# Patient Record
Sex: Male | Born: 1960 | Race: White | Marital: Single
Health system: Southern US, Community
[De-identification: ages and names within clinical notes are randomized; demographics above are authoritative.]

## PROBLEM LIST (undated history)

## (undated) DIAGNOSIS — R002 Palpitations: Secondary | ICD-10-CM

## (undated) DIAGNOSIS — I2510 Atherosclerotic heart disease of native coronary artery without angina pectoris: Secondary | ICD-10-CM

## (undated) DIAGNOSIS — E119 Type 2 diabetes mellitus without complications: Secondary | ICD-10-CM

## (undated) DIAGNOSIS — I10 Essential (primary) hypertension: Secondary | ICD-10-CM

## (undated) DIAGNOSIS — K219 Gastro-esophageal reflux disease without esophagitis: Secondary | ICD-10-CM

## (undated) DIAGNOSIS — E785 Hyperlipidemia, unspecified: Secondary | ICD-10-CM

## (undated) HISTORY — DX: Hyperlipidemia, unspecified: E78.5

## (undated) HISTORY — DX: Essential (primary) hypertension: I10

## (undated) HISTORY — DX: Palpitations: R00.2

## (undated) HISTORY — DX: Type 2 diabetes mellitus without complications: E11.9

## (undated) HISTORY — PX: SALIVARY GLAND SURGERY: SHX768

---

## 1999-05-26 DIAGNOSIS — C4492 Squamous cell carcinoma of skin, unspecified: Secondary | ICD-10-CM

## 1999-05-26 HISTORY — DX: Squamous cell carcinoma of skin, unspecified: C44.92

## 2005-01-20 ENCOUNTER — Ambulatory Visit: Payer: Self-pay

## 2014-04-20 ENCOUNTER — Other Ambulatory Visit: Payer: Self-pay | Admitting: Otolaryngology

## 2014-04-20 ENCOUNTER — Other Ambulatory Visit (HOSPITAL_COMMUNITY): Payer: Self-pay | Admitting: Otolaryngology

## 2014-04-20 DIAGNOSIS — R221 Localized swelling, mass and lump, neck: Secondary | ICD-10-CM

## 2014-04-22 ENCOUNTER — Other Ambulatory Visit (HOSPITAL_COMMUNITY): Payer: Self-pay | Admitting: Otolaryngology

## 2014-04-22 DIAGNOSIS — R0989 Other specified symptoms and signs involving the circulatory and respiratory systems: Secondary | ICD-10-CM

## 2014-04-24 ENCOUNTER — Ambulatory Visit (HOSPITAL_COMMUNITY): Payer: Self-pay

## 2014-04-27 ENCOUNTER — Other Ambulatory Visit: Payer: Self-pay | Admitting: Radiology

## 2014-04-30 ENCOUNTER — Other Ambulatory Visit: Payer: Self-pay | Admitting: Radiology

## 2014-05-01 ENCOUNTER — Ambulatory Visit (HOSPITAL_COMMUNITY)
Admission: RE | Admit: 2014-05-01 | Discharge: 2014-05-01 | Disposition: A | Discharge status: Home / Self Care | Payer: BLUE CROSS/BLUE SHIELD | Source: Ambulatory Visit | Attending: Otolaryngology | Admitting: Otolaryngology

## 2014-05-01 ENCOUNTER — Encounter (HOSPITAL_COMMUNITY): Payer: Self-pay

## 2014-05-01 DIAGNOSIS — R590 Localized enlarged lymph nodes: Secondary | ICD-10-CM | POA: Insufficient documentation

## 2014-05-01 DIAGNOSIS — R0989 Other specified symptoms and signs involving the circulatory and respiratory systems: Secondary | ICD-10-CM

## 2014-05-01 LAB — CBC
HCT: 36.1 % — ABNORMAL LOW (ref 39.0–52.0)
HEMOGLOBIN: 12.3 g/dL — AB (ref 13.0–17.0)
MCH: 30.4 pg (ref 26.0–34.0)
MCHC: 34.1 g/dL (ref 30.0–36.0)
MCV: 89.1 fL (ref 78.0–100.0)
PLATELETS: 183 10*3/uL (ref 150–400)
RBC: 4.05 MIL/uL — ABNORMAL LOW (ref 4.22–5.81)
RDW: 12.4 % (ref 11.5–15.5)
WBC: 4 10*3/uL (ref 4.0–10.5)

## 2014-05-01 LAB — GLUCOSE, CAPILLARY: Glucose-Capillary: 271 mg/dL — ABNORMAL HIGH (ref 70–99)

## 2014-05-01 LAB — APTT: aPTT: 28 seconds (ref 24–37)

## 2014-05-01 LAB — PROTIME-INR
INR: 0.99 (ref 0.00–1.49)
Prothrombin Time: 13.1 seconds (ref 11.6–15.2)

## 2014-05-01 MED ORDER — FENTANYL CITRATE 0.05 MG/ML IJ SOLN
INTRAMUSCULAR | Status: AC
  Filled 2014-05-01: qty 2

## 2014-05-01 MED ORDER — SODIUM CHLORIDE 0.9 % IV SOLN: Freq: Once | INTRAVENOUS | Status: DC

## 2014-05-01 MED ORDER — FENTANYL CITRATE 0.05 MG/ML IJ SOLN
INTRAMUSCULAR | Status: AC | PRN
  Administered 2014-05-01: 50 ug via INTRAVENOUS

## 2014-05-01 MED ORDER — LIDOCAINE HCL (PF) 1 % IJ SOLN
INTRAMUSCULAR | Status: AC
  Filled 2014-05-01: qty 10

## 2014-05-01 MED ORDER — MIDAZOLAM HCL 2 MG/2ML IJ SOLN
INTRAMUSCULAR | Status: AC
  Filled 2014-05-01: qty 2

## 2014-05-01 MED ORDER — MIDAZOLAM HCL 2 MG/2ML IJ SOLN
INTRAMUSCULAR | Status: AC | PRN
  Administered 2014-05-01: 1 mg via INTRAVENOUS

## 2014-05-01 NOTE — Sedation Documentation (Signed)
Advised Dr. Darreld Mclean of DM and prior surgery.

## 2014-05-01 NOTE — H&P (Signed)
Chief Complaint: L neck mass  Referring Physician(s): Kelly Hardy,Ewain P.  History of Present Illness: Kelly Hardy is a 54 y.o. 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  Past Surgical History  Procedure Laterality Date  . Salivary gland surgery Left     benign    Allergies: Review of patient's allergies indicates not on file.  Medications: Prior to Admission medications   Not on File    No family history on file.  History   Social History  . Marital Status: Married    Spouse Name: N/A    Number of Children: N/A  . Years of Education: N/A   Social History Main Topics  . Smoking status: Never Smoker   . Smokeless tobacco: Not on file  . Alcohol Use: Not on file  . Drug Use: Not on file  . Sexual Activity: Not on file   Other Topics Concern  . Not on file   Social History Narrative  . No narrative on file      Review of Systems: A 12 point ROS discussed and pertinent positives are indicated in the HPI above.  All other systems are negative.  Review of Systems  Constitutional: Negative for fever, activity change and appetite change.  HENT: Negative for sore throat and trouble swallowing.   Respiratory: Negative for shortness of breath.   Gastrointestinal: Negative for abdominal pain.  Neurological: Negative for weakness.  Psychiatric/Behavioral: Negative for behavioral problems and confusion.     Vital Signs: BP 149/66 mmHg  Pulse 71  Temp(Src) 97.9 F (36.6 C) (Oral)  Resp 18  Ht 5\' 9"  (1.753 m)  Wt 66.679 kg (147 lb)  BMI 21.70 kg/m2  SpO2 100%  Physical Exam  Constitutional: He is oriented to person, place, and time.  Cardiovascular: Normal rate, regular rhythm and normal heart sounds.   No murmur heard. Pulmonary/Chest:  Effort normal and breath sounds normal. He has no wheezes.  Abdominal: Soft. Bowel sounds are normal. There is no tenderness.  Musculoskeletal: Normal range of motion.  Neurological: He is alert and oriented to person, place, and time.  Skin: Skin is warm and dry.  Psychiatric: He has a normal mood and affect. His behavior is normal. Judgment and thought content normal.  Nursing note and vitals reviewed.   Imaging: No results found.  Labs:  CBC: No results for input(s): WBC, HGB, HCT, PLT in the last 8760 hours.  COAGS: No results for input(s): INR, APTT in the last 8760 hours.  BMP: No results for input(s): NA, K, CL, CO2, GLUCOSE, BUN, CALCIUM, CREATININE, GFRNONAA, GFRAA in the last 8760 hours.  Invalid input(s): CMP  LIVER FUNCTION TESTS: No results for input(s): BILITOT, AST, ALT, ALKPHOS, PROT, ALBUMIN in the last 8760 hours.  TUMOR MARKERS: No results for input(s): AFPTM, CEA, CA199, CHROMGRNA in the last 8760 hours.  Assessment and Plan:  Lt neck swelling x 2 months No pain; no change US shows enlarged LAN L salivary gland mass removed yrs ago: benign Now scheduled for bx Pt aware of procedure benefits and risks including but not limited to: Infection; bleeding Pt agreeable to proceed Consent signed andin chart  Thank you for this interesting consult.  I greatly enjoyed meeting Kelly Hardy and look forward to participating in their  care.  Signed: Lukisha Hardy A 05/01/2014, 8:51 AM   I spent a total of 20 minutes face to face in clinical consultation, greater than 50% of which was counseling/coordinating care for L neck LAN bx

## 2014-05-01 NOTE — Sedation Documentation (Signed)
Discussing patients am blood sugar, pt stated he had Diabetes type 1 and held his 70/30 insulin this am for the procedure. 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

## 2014-05-01 NOTE — Sedation Documentation (Signed)
Pt noted to have sinus arythmia with respirations.

## 2014-05-01 NOTE — Sedation Documentation (Signed)
Report given to [facility], radiology RN.  Pt to transfer to nurses station and discharge in 67min w/ wife per order Dr. Earleen Newport. Pt AAOx4. No c/o pain.

## 2014-05-01 NOTE — Discharge Instructions (Signed)
Conscious Sedation °Sedation is the use of medicines to promote relaxation and relieve discomfort and anxiety. Conscious sedation is a type of sedation. Under conscious sedation you are less alert than normal but are still able to respond to instructions or stimulation. Conscious sedation is used during short medical and dental procedures. It is milder than deep sedation or general anesthesia and allows you to return to your regular activities sooner.  °LET YOUR HEALTH CARE PROVIDER KNOW ABOUT:  °· Any allergies you have. °· All medicines you are taking, including vitamins, herbs, eye drops, creams, and over-the-counter medicines. °· Use of steroids (by mouth or creams). °· Previous problems you or members of your family have had with the use of anesthetics. °· Any blood disorders you have. °· Previous surgeries you have had. °· Medical conditions you have. °· Possibility of pregnancy, if this applies. °· Use of cigarettes, alcohol, or illegal drugs. °RISKS AND COMPLICATIONS °Generally, this is a safe procedure. However, as with any procedure, problems can occur. Possible problems include: °· Oversedation. °· Trouble breathing on your own. You may need to have a breathing tube until you are awake and breathing on your own. °· Allergic reaction to any of the medicines used for the procedure. °BEFORE THE PROCEDURE °· You may have blood tests done. These tests can help show how well your kidneys and liver are working. They can also show how well your blood clots. °· A physical exam will be done.   °· Only take medicines as directed by your health care provider. You may need to stop taking medicines (such as blood thinners, aspirin, or nonsteroidal anti-inflammatory drugs) before the procedure.   °· Do not eat or drink at least 6 hours before the procedure or as directed by your health care provider. °· Arrange for a responsible adult, family member, or friend to take you home after the procedure. He or she should stay  with you for at least 24 hours after the procedure, until the medicine has worn off. °PROCEDURE  °· An intravenous (IV) catheter will be inserted into one of your veins. Medicine will be able to flow directly into your body through this catheter. You may be given medicine through this tube to help prevent pain and help you relax. °· The medical or dental procedure will be done. °AFTER THE PROCEDURE °· You will stay in a recovery area until the medicine has worn off. Your blood pressure and pulse will be checked.   °·  Depending on the procedure you had, you may be allowed to go home when you can tolerate liquids and your pain is under control. °Document Released: 12/13/2000 Document Revised: 03/25/2013 Document Reviewed: 11/25/2012 °ExitCare® Patient Information ©2015 [employer]. This information is not intended to replace advice given to you by your health care provider. Make sure you discuss any questions you have with your health care provider. ° °

## 2014-05-01 NOTE — Sedation Documentation (Signed)
Patient is resting comfortably. 

## 2014-05-01 NOTE — Procedures (Signed)
Interventional Radiology Procedure Note  Procedure: US guided biopsy of left submandibular nodule, palpable by the patient, in a region of prior surgical resection of mass. Complications: No immediate Recommendations:  - Ok to Art therapist,  Dulcy Fanny. Earleen Newport, DO

## 2014-05-01 NOTE — Sedation Documentation (Signed)
Discharge instructions reviewed with pt and wife.  Dressing clean dry and intact. Denies pain at site.  No redness swelling or drainage ice pack has been in place.

## 2014-05-01 NOTE — Sedation Documentation (Signed)
Patient denies pain and is resting comfortably.  

## 2014-05-01 NOTE — Sedation Documentation (Signed)
Dr. Earleen Newport in to s/w pt regarding procedure

## 2014-05-01 NOTE — Sedation Documentation (Signed)
Dr. Earleen Newport discussing outcome of procedure w/ patient.

## 2016-01-30 NOTE — Progress Notes (Addendum)
Cardiology Office Note   Date:  02/01/2016   ID:  Kelly Hardy, DOB 1961/01/03, MRN [id number]  PCP:  Julianne Rice  Cardiologist:   [name], MD  Referring:  Dr. Forde Dandy  Chief Complaint  Patient presents with  . Chest Pain     History of Present Illness: Kelly Hardy is a 55 y.o. male who presents for evaluation of chest discomfort. He has no past cardiac history though he has very significant cardiovascular risk factors. He did have a stress perfusion study by his description some years ago and was told this was negative. He reports that for about a year she's had increasing lethargy. He works hard and unload trucks. He notices that when he does this he might get some chest tightness. However, he can keep working and the pain will go away. He does not describe radiation to his jaw or to his arms. He thinks it's been a stable pattern over several months. It is moderate in intensity when it happens. He might get slightly nauseated. He doesn't have any shortness of breath, PND or orthopnea. He's not had any resting complaints. He's not sure whether this is similar to what he had before. He doesn't have any resting PND or orthopnea. He's had no presyncope or syncope. He will occasionally get some palpitations. These sound like isolated skipped beats. He drinks a fair amount of caffeine and only sleeps about 5 hours per night.   Past Medical History:  Diagnosis Date  . Diabetes mellitus without complication ([place])   . Hyperlipidemia   . Hypertension   . Palpitations    eval 2014    Past Surgical History:  Procedure Laterality Date  . SALIVARY GLAND SURGERY Left    benign     Current Outpatient Prescriptions  Medication Sig Dispense Refill  . insulin aspart (NOVOLOG) 100 UNIT/ML injection Inject 1-2 Units into the skin daily as needed for high blood sugar.    Marland Kitchen NOVOLOG MIX 70/30 FLEXPEN (70-30) 100 UNIT/ML FlexPen Inject 12 Units into the skin 2 (two) times daily.  0    No current facility-administered medications for this visit.     Allergies:   Review of patient's allergies indicates no known allergies.    Social History:  The patient  reports that he has never smoked. He has never used smokeless tobacco. He reports that he does not drink alcohol or use drugs.   Family History:  The patient's family history includes CAD (age of onset: 76) in his father; CAD (age of onset: 67) in his brother; Coronary artery disease (age of onset: 31) in his mother; Heart failure in his maternal grandmother.    ROS:  Please see the history of present illness.   Otherwise, review of systems are positive for none.   All other systems are reviewed and negative.    PHYSICAL EXAM: VS:  BP 140/68   Pulse 85   Ht 5\' 9"  (1.753 m)   Wt 151 lb (68.5 kg)   SpO2 99%   BMI 22.30 kg/m  , BMI Body mass index is 22.3 kg/m. GENERAL:  Well appearing HEENT:  Pupils equal round and reactive, fundi not visualized, oral mucosa unremarkable NECK:  No jugular venous distention, waveform within normal limits, carotid upstroke brisk and symmetric, no bruits, no thyromegaly LYMPHATICS:  No cervical, inguinal adenopathy LUNGS:  Clear to auscultation bilaterally BACK:  No CVA tenderness CHEST:  Unremarkable HEART:  PMI not displaced or sustained,S1 and S2 within normal  limits, no S3, no S4, no clicks, no rubs, no murmurs ABD:  Flat, positive bowel sounds normal in frequency in pitch, no bruits, no rebound, no guarding, no midline pulsatile mass, no hepatomegaly, no splenomegaly EXT:  2 plus pulses throughout, no edema, no cyanosis no clubbing SKIN:  No rashes no nodules NEURO:  Cranial nerves II through XII grossly intact, motor grossly intact throughout PSYCH:  Cognitively intact, oriented to person place and time    EKG:  EKG is ordered today. The ekg ordered today demonstrates sinus rhythm, rate 76, axis within normal limits, intervals within normal limits, inferior T-wave  inversions.  No old EKGs to compare   Recent Labs: No results found for requested labs within last 8760 hours.    Lipid Panel No results found for: CHOL, TRIG, HDL, CHOLHDL, VLDL, LDLCALC, LDLDIRECT    Wt Readings from Last 3 Encounters:  02/01/16 151 lb (68.5 kg)  01/14/16 149 lb (67.6 kg)  05/01/14 147 lb (66.7 kg)      Other studies Reviewed: Additional studies/ records that were reviewed today include: Office records. Review of the above records demonstrates:  Please see elsewhere in the note.     ASSESSMENT AND PLAN:  CHEST PAIN:  His pain is somewhat atypical. However, he has significant risk factors including family history and diabetes. We had a long discussion about this. I think the pretest probability of obstructive coronary disease is moderate. He will have a [employer].  He does understand that even if this is normal and he starts to get increasing symptoms or any suggestion of an unstable pattern I would want to see him back. In the meantime he will employ aggressive risk reduction.  DYSLIPIDEMIA:  His LDL was 133. Dr. Forde Dandy suggested a statin but he didn't start this. I absolutely agree with that and had a long discussion with him about the need for excellent cholesterol control.  HTN:   His blood pressure is borderline. I think it should be in the 130s over 80s. He will keep a blood pressure diary.    DM:  Per Drs Forde Dandy and Sasser   Current medicines are reviewed at length with the patient today.  The patient does not have concerns regarding medicines.  The following changes have been made:  no change  Labs/ tests ordered today include:   Orders Placed This Encounter  Procedures  . NM Myocar Multi W/Spect W/Wall Motion / EF  . Myocardial Perfusion Imaging  . EKG 12-Lead     Disposition:   FU with me in six months.     Signed, [name], MD  02/01/2016 11:35 AM    [facility]  Addendum: Lexi scan Myoview was  switched to GXT since the patient wanted to walk on the treadmill. He developed 2 mm ST depression at about 4-1/2 minutes associated with chest tightness into his left shoulder. This is similar to the pain he's been having when he walks or does heavy lifting at work. Dr. Johnsie Cancel reviewed the stress test with me and recommends cardiac catheterization which will be done tomorrow. We don't have the images from his stress Myoview back yet but his treadmill test was worrisome. He is a diabetic on insulin and both his parents had early CAD mother at 59 father in his 65s both are living in their 64s. Patient is agreeable to proceed with cardiac catheterization. Scheduled for tomorrow 1:30. I have reviewed the risks, indications, and alternatives to angioplasty and stenting  with the patient. Risks include but are not limited to bleeding, infection, vascular injury, stroke, myocardial infection, arrhythmia, kidney injury, radiation-related injury in the case of prolonged fluoroscopy use, emergency cardiac surgery, and death. The patient understands the risks of serious complication is low (123456) and he agrees to proceed.

## 2016-01-31 ENCOUNTER — Encounter: Payer: Self-pay | Admitting: *Deleted

## 2016-02-01 ENCOUNTER — Ambulatory Visit (INDEPENDENT_AMBULATORY_CARE_PROVIDER_SITE_OTHER): Payer: BLUE CROSS/BLUE SHIELD | Admitting: Cardiology

## 2016-02-01 ENCOUNTER — Encounter: Payer: Self-pay | Admitting: *Deleted

## 2016-02-01 ENCOUNTER — Encounter: Payer: Self-pay | Admitting: Cardiology

## 2016-02-01 VITALS — BP 140/68 | HR 85 | Ht 69.0 in | Wt 151.0 lb

## 2016-02-01 DIAGNOSIS — R9431 Abnormal electrocardiogram [ECG] [EKG]: Secondary | ICD-10-CM | POA: Insufficient documentation

## 2016-02-01 DIAGNOSIS — R079 Chest pain, unspecified: Secondary | ICD-10-CM | POA: Diagnosis not present

## 2016-02-01 DIAGNOSIS — R5383 Other fatigue: Secondary | ICD-10-CM | POA: Diagnosis not present

## 2016-02-01 NOTE — Patient Instructions (Signed)
Medication Instructions:  Continue all current medications.  Labwork: none  Testing/Procedures:  Your physician has requested that you have a lexiscan myoview. For further information please visit [email or web address]. Please follow instruction sheet, as given.  Office will contact with results via phone or letter.    Follow-Up: Your physician wants you to follow up in: 6 months.  You will receive a reminder letter in the mail one-two months in advance.  If you don't receive a letter, please call our office to schedule the follow up appointment   Any Other Special Instructions Will Be Listed Below (If Applicable).  If you need a refill on your cardiac medications before your next appointment, please call your pharmacy.  

## 2016-02-09 ENCOUNTER — Other Ambulatory Visit: Payer: Self-pay | Admitting: *Deleted

## 2016-02-09 ENCOUNTER — Other Ambulatory Visit: Payer: Self-pay | Admitting: Physician Assistant

## 2016-02-09 ENCOUNTER — Inpatient Hospital Stay (HOSPITAL_COMMUNITY): Admission: RE | Admit: 2016-02-09 | Payer: BLUE CROSS/BLUE SHIELD | Source: Ambulatory Visit

## 2016-02-09 ENCOUNTER — Encounter: Payer: Self-pay | Admitting: *Deleted

## 2016-02-09 ENCOUNTER — Encounter (HOSPITAL_COMMUNITY)
Admission: RE | Admit: 2016-02-09 | Discharge: 2016-02-09 | Disposition: A | Discharge status: Home / Self Care | Payer: BLUE CROSS/BLUE SHIELD | Source: Ambulatory Visit | Attending: Cardiology | Admitting: Cardiology

## 2016-02-09 ENCOUNTER — Other Ambulatory Visit (HOSPITAL_COMMUNITY)
Admission: RE | Admit: 2016-02-09 | Discharge: 2016-02-09 | Disposition: A | Discharge status: Home / Self Care | Payer: BLUE CROSS/BLUE SHIELD | Source: Ambulatory Visit | Attending: Physician Assistant | Admitting: Physician Assistant

## 2016-02-09 ENCOUNTER — Encounter (HOSPITAL_COMMUNITY): Payer: Self-pay

## 2016-02-09 ENCOUNTER — Ambulatory Visit (HOSPITAL_COMMUNITY)
Admission: RE | Admit: 2016-02-09 | Discharge: 2016-02-09 | Disposition: A | Discharge status: Home / Self Care | Payer: BLUE CROSS/BLUE SHIELD | Source: Ambulatory Visit | Attending: Physician Assistant | Admitting: Physician Assistant

## 2016-02-09 DIAGNOSIS — R0789 Other chest pain: Secondary | ICD-10-CM

## 2016-02-09 DIAGNOSIS — R079 Chest pain, unspecified: Secondary | ICD-10-CM | POA: Insufficient documentation

## 2016-02-09 DIAGNOSIS — R9431 Abnormal electrocardiogram [ECG] [EKG]: Secondary | ICD-10-CM | POA: Insufficient documentation

## 2016-02-09 LAB — NM MYOCAR MULTI W/SPECT W/WALL MOTION / EF
CHL CUP MPHR: 164 {beats}/min
CHL CUP NUCLEAR SDS: 0
CHL CUP RESTING HR STRESS: 71 {beats}/min
CSEPEDS: 50 s
CSEPHR: 93 %
CSEPPHR: 153 {beats}/min
Estimated workload: 7 METS
Exercise duration (min): 5 min
LV dias vol: 108 mL (ref 62–150)
LVSYSVOL: 46 mL
RATE: 0.25
RPE: 13
SRS: 0
SSS: 0
TID: 1.08

## 2016-02-09 LAB — CBC WITH DIFFERENTIAL/PLATELET
BASOS ABS: 0 10*3/uL (ref 0.0–0.1)
Basophils Relative: 1 %
Eosinophils Absolute: 0.1 10*3/uL (ref 0.0–0.7)
Eosinophils Relative: 3 %
HEMATOCRIT: 38.1 % — AB (ref 39.0–52.0)
Hemoglobin: 12.7 g/dL — ABNORMAL LOW (ref 13.0–17.0)
LYMPHS ABS: 1.4 10*3/uL (ref 0.7–4.0)
LYMPHS PCT: 26 %
MCH: 30.5 pg (ref 26.0–34.0)
MCHC: 33.3 g/dL (ref 30.0–36.0)
MCV: 91.6 fL (ref 78.0–100.0)
Monocytes Absolute: 0.3 10*3/uL (ref 0.1–1.0)
Monocytes Relative: 6 %
NEUTROS ABS: 3.5 10*3/uL (ref 1.7–7.7)
Neutrophils Relative %: 64 %
Platelets: 217 10*3/uL (ref 150–400)
RBC: 4.16 MIL/uL — AB (ref 4.22–5.81)
RDW: 12.6 % (ref 11.5–15.5)
WBC: 5.4 10*3/uL (ref 4.0–10.5)

## 2016-02-09 LAB — PROTIME-INR
INR: 0.99
Prothrombin Time: 13.1 seconds (ref 11.4–15.2)

## 2016-02-09 LAB — BASIC METABOLIC PANEL
ANION GAP: 6 (ref 5–15)
BUN: 11 mg/dL (ref 6–20)
CHLORIDE: 99 mmol/L — AB (ref 101–111)
CO2: 29 mmol/L (ref 22–32)
Calcium: 9 mg/dL (ref 8.9–10.3)
Creatinine, Ser: 0.79 mg/dL (ref 0.61–1.24)
GFR calc Af Amer: >60 mL/min (ref >60)
GFR calc non Af Amer: >60 mL/min (ref >60)
GLUCOSE: 182 mg/dL — AB (ref 65–99)
POTASSIUM: 4 mmol/L (ref 3.5–5.1)
Sodium: 134 mmol/L — ABNORMAL LOW (ref 135–145)

## 2016-02-09 MED ORDER — REGADENOSON 0.4 MG/5ML IV SOLN
INTRAVENOUS | Status: AC
  Filled 2016-02-09: qty 5

## 2016-02-09 MED ORDER — TECHNETIUM TC 99M TETROFOSMIN IV KIT
30.0000 | PACK | Freq: Once | INTRAVENOUS | Status: AC | PRN
  Administered 2016-02-09: 31.5 via INTRAVENOUS

## 2016-02-09 MED ORDER — SODIUM CHLORIDE 0.9% FLUSH
INTRAVENOUS | Status: AC
  Administered 2016-02-09: 12:00:00 via INTRAVENOUS
  Filled 2016-02-09: qty 10

## 2016-02-09 MED ORDER — TECHNETIUM TC 99M TETROFOSMIN IV KIT
10.0000 | PACK | Freq: Once | INTRAVENOUS | Status: AC | PRN
  Administered 2016-02-09: 11 via INTRAVENOUS

## 2016-02-09 NOTE — Progress Notes (Signed)
Orders placed for cardiac cath

## 2016-02-10 ENCOUNTER — Other Ambulatory Visit: Payer: Self-pay

## 2016-02-10 ENCOUNTER — Ambulatory Visit (HOSPITAL_COMMUNITY)
Admission: RE | Admit: 2016-02-10 | Discharge: 2016-02-11 | Disposition: A | Discharge status: Home / Self Care | Payer: BLUE CROSS/BLUE SHIELD | Source: Ambulatory Visit | Attending: Cardiovascular Disease | Admitting: Cardiovascular Disease

## 2016-02-10 ENCOUNTER — Encounter (HOSPITAL_COMMUNITY)
Admission: RE | Disposition: A | Discharge status: Home / Self Care | Payer: Self-pay | Source: Ambulatory Visit | Attending: Cardiovascular Disease

## 2016-02-10 DIAGNOSIS — Z9861 Coronary angioplasty status: Secondary | ICD-10-CM

## 2016-02-10 DIAGNOSIS — I25110 Atherosclerotic heart disease of native coronary artery with unstable angina pectoris: Secondary | ICD-10-CM | POA: Insufficient documentation

## 2016-02-10 DIAGNOSIS — I10 Essential (primary) hypertension: Secondary | ICD-10-CM | POA: Diagnosis present

## 2016-02-10 DIAGNOSIS — E785 Hyperlipidemia, unspecified: Secondary | ICD-10-CM | POA: Diagnosis not present

## 2016-02-10 DIAGNOSIS — E119 Type 2 diabetes mellitus without complications: Secondary | ICD-10-CM | POA: Diagnosis not present

## 2016-02-10 DIAGNOSIS — I2089 Other forms of angina pectoris: Secondary | ICD-10-CM

## 2016-02-10 DIAGNOSIS — I208 Other forms of angina pectoris: Secondary | ICD-10-CM

## 2016-02-10 DIAGNOSIS — I2510 Atherosclerotic heart disease of native coronary artery without angina pectoris: Secondary | ICD-10-CM | POA: Diagnosis present

## 2016-02-10 DIAGNOSIS — R9439 Abnormal result of other cardiovascular function study: Secondary | ICD-10-CM

## 2016-02-10 DIAGNOSIS — Z8249 Family history of ischemic heart disease and other diseases of the circulatory system: Secondary | ICD-10-CM | POA: Insufficient documentation

## 2016-02-10 DIAGNOSIS — I25118 Atherosclerotic heart disease of native coronary artery with other forms of angina pectoris: Secondary | ICD-10-CM

## 2016-02-10 DIAGNOSIS — Z955 Presence of coronary angioplasty implant and graft: Secondary | ICD-10-CM

## 2016-02-10 HISTORY — DX: Atherosclerotic heart disease of native coronary artery without angina pectoris: I25.10

## 2016-02-10 HISTORY — DX: Gastro-esophageal reflux disease without esophagitis: K21.9

## 2016-02-10 HISTORY — PX: CORONARY STENT PLACEMENT: SHX1402

## 2016-02-10 HISTORY — PX: CARDIAC CATHETERIZATION: SHX172

## 2016-02-10 LAB — CARDIAC CATHETERIZATION: CATHEFQUANT: 65 %

## 2016-02-10 LAB — GLUCOSE, CAPILLARY
Glucose-Capillary: 263 mg/dL — ABNORMAL HIGH (ref 65–99)
Glucose-Capillary: 307 mg/dL — ABNORMAL HIGH (ref 65–99)
Glucose-Capillary: 271 mg/dL — ABNORMAL HIGH (ref 65–99)

## 2016-02-10 LAB — POCT ACTIVATED CLOTTING TIME: Activated Clotting Time: 560 s

## 2016-02-10 LAB — TROPONIN I: TROPONIN I: 0.52 ng/mL — AB (ref <0.03)

## 2016-02-10 SURGERY — LEFT HEART CATH AND CORONARY ANGIOGRAPHY

## 2016-02-10 MED ORDER — TICAGRELOR 90 MG PO TABS
ORAL_TABLET | ORAL | Status: AC
  Filled 2016-02-10: qty 1

## 2016-02-10 MED ORDER — ASPIRIN 81 MG PO CHEW
81.0000 mg | CHEWABLE_TABLET | Freq: Every day | ORAL | Status: DC
  Administered 2016-02-11: 10:00:00 81 mg via ORAL
  Filled 2016-02-10: qty 1

## 2016-02-10 MED ORDER — ROSUVASTATIN CALCIUM 20 MG PO TABS
40.0000 mg | ORAL_TABLET | Freq: Every day | ORAL | Status: DC
  Administered 2016-02-10: 40 mg via ORAL
  Filled 2016-02-10: qty 2

## 2016-02-10 MED ORDER — INSULIN ASPART PROT & ASPART (70-30 MIX) 100 UNIT/ML ~~LOC~~ SUSP
12.0000 [IU] | Freq: Two times a day (BID) | SUBCUTANEOUS | Status: DC
  Administered 2016-02-10 – 2016-02-11 (×2): 12 [IU] via SUBCUTANEOUS
  Filled 2016-02-10: qty 10

## 2016-02-10 MED ORDER — SODIUM CHLORIDE 0.9 % IV SOLN: INTRAVENOUS | Status: DC

## 2016-02-10 MED ORDER — SODIUM CHLORIDE 0.9% FLUSH
3.0000 mL | Freq: Two times a day (BID) | INTRAVENOUS | Status: DC
  Administered 2016-02-11: 10:00:00 3 mL via INTRAVENOUS

## 2016-02-10 MED ORDER — BIVALIRUDIN BOLUS VIA INFUSION - CUPID
INTRAVENOUS | Status: DC | PRN
  Administered 2016-02-10: 49.35 mg via INTRAVENOUS

## 2016-02-10 MED ORDER — MIDAZOLAM HCL 2 MG/2ML IJ SOLN
INTRAMUSCULAR | Status: AC
  Filled 2016-02-10: qty 2

## 2016-02-10 MED ORDER — SODIUM CHLORIDE 0.9 % IV SOLN
INTRAVENOUS | Status: DC | PRN
  Administered 2016-02-10: 1.75 mg/kg/h via INTRAVENOUS

## 2016-02-10 MED ORDER — MIDAZOLAM HCL 2 MG/2ML IJ SOLN
INTRAMUSCULAR | Status: DC | PRN
  Administered 2016-02-10: 2 mg via INTRAVENOUS
  Administered 2016-02-10: 1 mg via INTRAVENOUS

## 2016-02-10 MED ORDER — SODIUM CHLORIDE 0.9% FLUSH: 3.0000 mL | INTRAVENOUS | Status: DC | PRN

## 2016-02-10 MED ORDER — ONDANSETRON HCL 4 MG/2ML IJ SOLN: 4.0000 mg | Freq: Four times a day (QID) | INTRAMUSCULAR | Status: DC | PRN

## 2016-02-10 MED ORDER — FENTANYL CITRATE (PF) 100 MCG/2ML IJ SOLN
INTRAMUSCULAR | Status: DC | PRN
  Administered 2016-02-10: 50 ug via INTRAVENOUS
  Administered 2016-02-10: 25 ug via INTRAVENOUS

## 2016-02-10 MED ORDER — ASPIRIN 81 MG PO CHEW
CHEWABLE_TABLET | ORAL | Status: AC
  Administered 2016-02-10: 81 mg via ORAL
  Filled 2016-02-10: qty 1

## 2016-02-10 MED ORDER — SODIUM CHLORIDE 0.9% FLUSH
3.0000 mL | Freq: Two times a day (BID) | INTRAVENOUS | Status: DC
  Administered 2016-02-10: 3 mL via INTRAVENOUS

## 2016-02-10 MED ORDER — FENTANYL CITRATE (PF) 100 MCG/2ML IJ SOLN
INTRAMUSCULAR | Status: AC
  Filled 2016-02-10: qty 2

## 2016-02-10 MED ORDER — IOPAMIDOL (ISOVUE-370) INJECTION 76%
INTRAVENOUS | Status: AC
  Filled 2016-02-10: qty 100

## 2016-02-10 MED ORDER — VERAPAMIL HCL 2.5 MG/ML IV SOLN
INTRAVENOUS | Status: DC | PRN
  Administered 2016-02-10: 10 mL via INTRA_ARTERIAL

## 2016-02-10 MED ORDER — BIVALIRUDIN 250 MG IV SOLR
INTRAVENOUS | Status: AC
  Filled 2016-02-10: qty 250

## 2016-02-10 MED ORDER — VERAPAMIL HCL 2.5 MG/ML IV SOLN
INTRAVENOUS | Status: AC
  Filled 2016-02-10: qty 2

## 2016-02-10 MED ORDER — TICAGRELOR 90 MG PO TABS
ORAL_TABLET | ORAL | Status: DC | PRN
  Administered 2016-02-10: 180 mg via ORAL

## 2016-02-10 MED ORDER — ACETAMINOPHEN 325 MG PO TABS: 650.0000 mg | ORAL_TABLET | ORAL | Status: DC | PRN

## 2016-02-10 MED ORDER — TICAGRELOR 90 MG PO TABS
90.0000 mg | ORAL_TABLET | Freq: Two times a day (BID) | ORAL | Status: DC
  Administered 2016-02-11 (×2): 90 mg via ORAL
  Filled 2016-02-10 (×2): qty 1

## 2016-02-10 MED ORDER — HEPARIN SODIUM (PORCINE) 1000 UNIT/ML IJ SOLN
INTRAMUSCULAR | Status: DC | PRN
  Administered 2016-02-10: 3500 [IU] via INTRAVENOUS

## 2016-02-10 MED ORDER — IOPAMIDOL (ISOVUE-370) INJECTION 76%
INTRAVENOUS | Status: DC | PRN
  Administered 2016-02-10: 165 mL via INTRA_ARTERIAL

## 2016-02-10 MED ORDER — HEPARIN (PORCINE) IN NACL 2-0.9 UNIT/ML-% IJ SOLN
INTRAMUSCULAR | Status: AC
  Filled 2016-02-10: qty 1000

## 2016-02-10 MED ORDER — INSULIN ASPART 100 UNIT/ML ~~LOC~~ SOLN
0.0000 [IU] | Freq: Every day | SUBCUTANEOUS | Status: DC
  Administered 2016-02-10: 22:00:00 3 [IU] via SUBCUTANEOUS

## 2016-02-10 MED ORDER — INSULIN ASPART 100 UNIT/ML ~~LOC~~ SOLN: 1.0000 [IU] | Freq: Every day | SUBCUTANEOUS | Status: DC | PRN

## 2016-02-10 MED ORDER — LIDOCAINE HCL (PF) 1 % IJ SOLN
INTRAMUSCULAR | Status: DC | PRN
  Administered 2016-02-10: 2 mL

## 2016-02-10 MED ORDER — ANGIOPLASTY BOOK
Freq: Once | Status: AC
  Administered 2016-02-10: 21:00:00
  Filled 2016-02-10: qty 1

## 2016-02-10 MED ORDER — SODIUM CHLORIDE 0.9 % IV SOLN: 250.0000 mL | INTRAVENOUS | Status: DC | PRN

## 2016-02-10 MED ORDER — NITROGLYCERIN 1 MG/10 ML FOR IR/CATH LAB
INTRA_ARTERIAL | Status: DC | PRN
  Administered 2016-02-10 (×3): 200 ug via INTRA_ARTERIAL

## 2016-02-10 MED ORDER — NITROGLYCERIN 1 MG/10 ML FOR IR/CATH LAB
INTRA_ARTERIAL | Status: AC
  Filled 2016-02-10: qty 10

## 2016-02-10 MED ORDER — HEPARIN (PORCINE) IN NACL 2-0.9 UNIT/ML-% IJ SOLN
INTRAMUSCULAR | Status: DC | PRN
Start: 2016-02-10 — End: 2016-02-10
  Administered 2016-02-10: 1000 mL

## 2016-02-10 MED ORDER — ASPIRIN 81 MG PO CHEW
81.0000 mg | CHEWABLE_TABLET | ORAL | Status: AC
  Administered 2016-02-10: 81 mg via ORAL

## 2016-02-10 MED ORDER — SODIUM CHLORIDE 0.9 % WEIGHT BASED INFUSION
3.0000 mL/kg/h | INTRAVENOUS | Status: DC
  Administered 2016-02-10: 3 mL/kg/h via INTRAVENOUS

## 2016-02-10 MED ORDER — LIDOCAINE HCL (PF) 1 % IJ SOLN
INTRAMUSCULAR | Status: AC
  Filled 2016-02-10: qty 30

## 2016-02-10 MED ORDER — HEPARIN SODIUM (PORCINE) 1000 UNIT/ML IJ SOLN
INTRAMUSCULAR | Status: AC
  Filled 2016-02-10: qty 1

## 2016-02-10 MED ORDER — INSULIN ASPART PROT & ASPART (70-30 MIX) 100 UNIT/ML PEN
12.0000 [IU] | PEN_INJECTOR | Freq: Two times a day (BID) | SUBCUTANEOUS | Status: DC
  Filled 2016-02-10: qty 3

## 2016-02-10 MED ORDER — DIAZEPAM 5 MG PO TABS: 5.0000 mg | ORAL_TABLET | ORAL | Status: DC | PRN

## 2016-02-10 MED ORDER — INSULIN ASPART 100 UNIT/ML ~~LOC~~ SOLN: 0.0000 [IU] | Freq: Three times a day (TID) | SUBCUTANEOUS | Status: DC

## 2016-02-10 MED ORDER — SODIUM CHLORIDE 0.9 % WEIGHT BASED INFUSION: 1.0000 mL/kg/h | INTRAVENOUS | Status: DC

## 2016-02-10 SURGICAL SUPPLY — 21 items
BALLN EUPHORA RX 2.5X12 (BALLOONS) ×3
BALLN ~~LOC~~ MOZEC 3.5X13 (BALLOONS) ×3
BALLOON EUPHORA RX 2.5X12 (BALLOONS) ×1 IMPLANT
BALLOON ~~LOC~~ MOZEC 3.5X13 (BALLOONS) ×1 IMPLANT
CATH INFINITI 5FR ANG PIGTAIL (CATHETERS) ×3 IMPLANT
CATH INFINITI JR4 5F (CATHETERS) ×3 IMPLANT
CATH OPTITORQUE TIG 4.0 5F (CATHETERS) ×3 IMPLANT
DEVICE RAD COMP TR BAND LRG (VASCULAR PRODUCTS) ×3 IMPLANT
GLIDESHEATH SLEND SS 6F .021 (SHEATH) ×3 IMPLANT
GUIDE CATH RUNWAY 6FR AR2 SH (CATHETERS) ×3 IMPLANT
GUIDEWIRE INQWIRE 1.5J.035X260 (WIRE) ×1 IMPLANT
INQWIRE 1.5J .035X260CM (WIRE) ×3
KIT ENCORE 26 ADVANTAGE (KITS) ×6 IMPLANT
KIT HEART LEFT (KITS) ×3 IMPLANT
PACK CARDIAC CATHETERIZATION (CUSTOM PROCEDURE TRAY) ×3 IMPLANT
STENT XIENCE ALPINE RX 3.25X15 (Permanent Stent) ×3 IMPLANT
SYR MEDRAD MARK V 150ML (SYRINGE) ×3 IMPLANT
TRANSDUCER W/STOPCOCK (MISCELLANEOUS) ×3 IMPLANT
TUBING CIL FLEX 10 FLL-RA (TUBING) ×3 IMPLANT
WIRE ASAHI PROWATER 180CM (WIRE) ×3 IMPLANT
WIRE PT2 MS 185 (WIRE) ×3 IMPLANT

## 2016-02-10 NOTE — H&P (View-Only) (Signed)
Cardiology Office Note   Date:  02/01/2016   ID:  Kelly Hardy, DOB Aug 31, 1960, MRN [id number]  PCP:  Julianne Rice  Cardiologist:   [name], MD  Referring:  Dr. Forde Dandy  Chief Complaint  Patient presents with  . Chest Pain     History of Present Illness: Kelly Hardy is a 55 y.o. male who presents for evaluation of chest discomfort. He has no past cardiac history though he has very significant cardiovascular risk factors. He did have a stress perfusion study by his description some years ago and was told this was negative. He reports that for about a year she's had increasing lethargy. He works hard and unload trucks. He notices that when he does this he might get some chest tightness. However, he can keep working and the pain will go away. He does not describe radiation to his jaw or to his arms. He thinks it's been a stable pattern over several months. It is moderate in intensity when it happens. He might get slightly nauseated. He doesn't have any shortness of breath, PND or orthopnea. He's not had any resting complaints. He's not sure whether this is similar to what he had before. He doesn't have any resting PND or orthopnea. He's had no presyncope or syncope. He will occasionally get some palpitations. These sound like isolated skipped beats. He drinks a fair amount of caffeine and only sleeps about 5 hours per night.   Past Medical History:  Diagnosis Date  . Diabetes mellitus without complication ([place])   . Hyperlipidemia   . Hypertension   . Palpitations    eval 2014    Past Surgical History:  Procedure Laterality Date  . SALIVARY GLAND SURGERY Left    benign     Current Outpatient Prescriptions  Medication Sig Dispense Refill  . insulin aspart (NOVOLOG) 100 UNIT/ML injection Inject 1-2 Units into the skin daily as needed for high blood sugar.    Marland Kitchen NOVOLOG MIX 70/30 FLEXPEN (70-30) 100 UNIT/ML FlexPen Inject 12 Units into the skin 2 (two) times daily.  0    No current facility-administered medications for this visit.     Allergies:   Review of patient's allergies indicates no known allergies.    Social History:  The patient  reports that he has never smoked. He has never used smokeless tobacco. He reports that he does not drink alcohol or use drugs.   Family History:  The patient's family history includes CAD (age of onset: 32) in his father; CAD (age of onset: 75) in his brother; Coronary artery disease (age of onset: 24) in his mother; Heart failure in his maternal grandmother.    ROS:  Please see the history of present illness.   Otherwise, review of systems are positive for none.   All other systems are reviewed and negative.    PHYSICAL EXAM: VS:  BP 140/68   Pulse 85   Ht 5\' 9"  (1.753 m)   Wt 151 lb (68.5 kg)   SpO2 99%   BMI 22.30 kg/m  , BMI Body mass index is 22.3 kg/m. GENERAL:  Well appearing HEENT:  Pupils equal round and reactive, fundi not visualized, oral mucosa unremarkable NECK:  No jugular venous distention, waveform within normal limits, carotid upstroke brisk and symmetric, no bruits, no thyromegaly LYMPHATICS:  No cervical, inguinal adenopathy LUNGS:  Clear to auscultation bilaterally BACK:  No CVA tenderness CHEST:  Unremarkable HEART:  PMI not displaced or sustained,S1 and S2 within normal  limits, no S3, no S4, no clicks, no rubs, no murmurs ABD:  Flat, positive bowel sounds normal in frequency in pitch, no bruits, no rebound, no guarding, no midline pulsatile mass, no hepatomegaly, no splenomegaly EXT:  2 plus pulses throughout, no edema, no cyanosis no clubbing SKIN:  No rashes no nodules NEURO:  Cranial nerves II through XII grossly intact, motor grossly intact throughout PSYCH:  Cognitively intact, oriented to person place and time    EKG:  EKG is ordered today. The ekg ordered today demonstrates sinus rhythm, rate 76, axis within normal limits, intervals within normal limits, inferior T-wave  inversions.  No old EKGs to compare   Recent Labs: No results found for requested labs within last 8760 hours.    Lipid Panel No results found for: CHOL, TRIG, HDL, CHOLHDL, VLDL, LDLCALC, LDLDIRECT    Wt Readings from Last 3 Encounters:  02/01/16 151 lb (68.5 kg)  01/14/16 149 lb (67.6 kg)  05/01/14 147 lb (66.7 kg)      Other studies Reviewed: Additional studies/ records that were reviewed today include: Office records. Review of the above records demonstrates:  Please see elsewhere in the note.     ASSESSMENT AND PLAN:  CHEST PAIN:  His pain is somewhat atypical. However, he has significant risk factors including family history and diabetes. We had a long discussion about this. I think the pretest probability of obstructive coronary disease is moderate. He will have a [employer].  He does understand that even if this is normal and he starts to get increasing symptoms or any suggestion of an unstable pattern I would want to see him back. In the meantime he will employ aggressive risk reduction.  DYSLIPIDEMIA:  His LDL was 133. Dr. Forde Dandy suggested a statin but he didn't start this. I absolutely agree with that and had a long discussion with him about the need for excellent cholesterol control.  HTN:   His blood pressure is borderline. I think it should be in the 130s over 80s. He will keep a blood pressure diary.    DM:  Per Drs Forde Dandy and Sasser   Current medicines are reviewed at length with the patient today.  The patient does not have concerns regarding medicines.  The following changes have been made:  no change  Labs/ tests ordered today include:   Orders Placed This Encounter  Procedures  . NM Myocar Multi W/Spect W/Wall Motion / EF  . Myocardial Perfusion Imaging  . EKG 12-Lead     Disposition:   FU with me in six months.     Signed, [name], MD  02/01/2016 11:35 AM    [facility]  Addendum: Lexi scan Myoview was  switched to GXT since the patient wanted to walk on the treadmill. He developed 2 mm ST depression at about 4-1/2 minutes associated with chest tightness into his left shoulder. This is similar to the pain he's been having when he walks or does heavy lifting at work. Dr. Johnsie Cancel reviewed the stress test with me and recommends cardiac catheterization which will be done tomorrow. We don't have the images from his stress Myoview back yet but his treadmill test was worrisome. He is a diabetic on insulin and both his parents had early CAD mother at 77 father in his 39s both are living in their 56s. Patient is agreeable to proceed with cardiac catheterization. Scheduled for tomorrow 1:30. I have reviewed the risks, indications, and alternatives to angioplasty and stenting  with the patient. Risks include but are not limited to bleeding, infection, vascular injury, stroke, myocardial infection, arrhythmia, kidney injury, radiation-related injury in the case of prolonged fluoroscopy use, emergency cardiac surgery, and death. The patient understands the risks of serious complication is low (123456) and he agrees to proceed.

## 2016-02-10 NOTE — Research (Signed)
[place] Study Informed Consent   Subject Name: Kelly Hardy  Subject met inclusion and exclusion criteria.  The informed consent form, study requirements and expectations were reviewed with the subject and questions and concerns were addressed prior to the signing of the consent form.  The subject verbalized understanding of the trial requirements.  The subject agreed to participate in the trial and signed the informed consent.  The informed consent was obtained prior to performance of any protocol-specific procedures for the subject.  A copy of the signed informed consent was given to the subject and a copy was placed in the subject's medical record.  Blossom Hoops 02/10/2016, 1:19 PM

## 2016-02-10 NOTE — Interval H&P Note (Signed)
Cath Lab Visit (complete for each Cath Lab visit)  Clinical Evaluation Leading to the Procedure:   ACS: No.  Non-ACS:    Anginal Classification: CCS III  Anti-ischemic medical therapy: No Therapy  Non-Invasive Test Results: Intermediate-risk stress test findings: cardiac mortality 1-3%/year  Prior CABG: No previous CABG      History and Physical Interval Note:  02/10/2016 3:35 PM  Lyndel Safe  has presented today for surgery, with the diagnosis of abnormal stress test  The various methods of treatment have been discussed with the patient and family. After consideration of risks, benefits and other options for treatment, the patient has consented to  Procedure(s): Left Heart Cath and Coronary Angiography (N/A) as a surgical intervention .  The patient's history has been reviewed, patient examined, no change in status, stable for surgery.  I have reviewed the patient's chart and labs.  Questions were answered to the patient's satisfaction.     Shelva Majestic

## 2016-02-10 NOTE — Progress Notes (Signed)
TR  BAND REMOVAL  LOCATION:    right radial  DEFLATED PER PROTOCOL:    Yes.    TIME BAND OFF / DRESSING APPLIED:    22:00   SITE UPON ARRIVAL:    Level 0  SITE AFTER BAND REMOVAL:    Level 0  CIRCULATION SENSATION AND MOVEMENT:    Within Normal Limits   Yes.    COMMENTS:   Post TR band instructions given. Pt tolerated well. 

## 2016-02-10 NOTE — Progress Notes (Signed)
Pt in cath holding awaiting 6 central to receive report and patient. Awake, alert, oriented pain free.MAE BP 130/54  NSR at 87, o2 sat 94% on room air.

## 2016-02-11 ENCOUNTER — Telehealth: Payer: Self-pay | Admitting: Cardiology

## 2016-02-11 ENCOUNTER — Encounter (HOSPITAL_COMMUNITY): Payer: Self-pay | Admitting: Cardiovascular Disease

## 2016-02-11 DIAGNOSIS — I10 Essential (primary) hypertension: Secondary | ICD-10-CM | POA: Diagnosis not present

## 2016-02-11 DIAGNOSIS — R9439 Abnormal result of other cardiovascular function study: Secondary | ICD-10-CM | POA: Diagnosis not present

## 2016-02-11 DIAGNOSIS — I25110 Atherosclerotic heart disease of native coronary artery with unstable angina pectoris: Secondary | ICD-10-CM | POA: Diagnosis not present

## 2016-02-11 DIAGNOSIS — E119 Type 2 diabetes mellitus without complications: Secondary | ICD-10-CM | POA: Diagnosis not present

## 2016-02-11 DIAGNOSIS — E785 Hyperlipidemia, unspecified: Secondary | ICD-10-CM | POA: Diagnosis present

## 2016-02-11 LAB — BASIC METABOLIC PANEL
ANION GAP: 6 (ref 5–15)
BUN: 11 mg/dL (ref 6–20)
CALCIUM: 8.5 mg/dL — AB (ref 8.9–10.3)
CO2: 25 mmol/L (ref 22–32)
CREATININE: 0.86 mg/dL (ref 0.61–1.24)
Chloride: 107 mmol/L (ref 101–111)
GFR calc non Af Amer: >60 mL/min (ref >60)
Glucose, Bld: 90 mg/dL (ref 65–99)
Potassium: 3.9 mmol/L (ref 3.5–5.1)
SODIUM: 138 mmol/L (ref 135–145)

## 2016-02-11 LAB — CBC
HCT: 34.2 % — ABNORMAL LOW (ref 39.0–52.0)
Hemoglobin: 11.5 g/dL — ABNORMAL LOW (ref 13.0–17.0)
MCH: 30.2 pg (ref 26.0–34.0)
MCHC: 33.6 g/dL (ref 30.0–36.0)
MCV: 89.8 fL (ref 78.0–100.0)
PLATELETS: 189 10*3/uL (ref 150–400)
RBC: 3.81 MIL/uL — AB (ref 4.22–5.81)
RDW: 12.6 % (ref 11.5–15.5)
WBC: 5.2 10*3/uL (ref 4.0–10.5)

## 2016-02-11 LAB — GLUCOSE, CAPILLARY: GLUCOSE-CAPILLARY: 156 mg/dL — AB (ref 65–99)

## 2016-02-11 MED ORDER — TICAGRELOR 90 MG PO TABS: 90.0000 mg | ORAL_TABLET | Freq: Two times a day (BID) | ORAL | 11 refills | Status: DC

## 2016-02-11 MED ORDER — ROSUVASTATIN CALCIUM 40 MG PO TABS: 40.0000 mg | ORAL_TABLET | Freq: Every day | ORAL | 6 refills | Status: DC

## 2016-02-11 MED ORDER — NITROGLYCERIN 0.4 MG SL SUBL
0.4000 mg | SUBLINGUAL_TABLET | SUBLINGUAL | 12 refills | Status: DC | PRN
Start: 2016-02-11 — End: 2017-04-08

## 2016-02-11 MED ORDER — METOPROLOL SUCCINATE ER 25 MG PO TB24: 25.0000 mg | ORAL_TABLET | Freq: Every day | ORAL | 6 refills | Status: DC

## 2016-02-11 NOTE — Telephone Encounter (Signed)
New message      TCM appt on 02-18-16 with Kerin Ransom per [place].

## 2016-02-11 NOTE — Care Management (Signed)
Benefits Check completed in regards to Brilinta. CM will make patient aware of cost. [facility] in [place] has medication available. No further needs from CM at this time.   S/W  SAMANTHA  @ [employer] # [phone]   BRILINTA 90 MG BID   COVER- YES  CO-PAY- $ 67.84  TIER- 3 DRUG  PRIOR APPROVAL - NO  PH[facility]ACY : [facility], [facility], [facility]

## 2016-02-11 NOTE — Progress Notes (Signed)
CARDIAC REHAB PHASE I   PRE:  Rate/Rhythm: 104 ST    129 ST up to bathroom  BP:  Supine: 147/66  Sitting:   Standing:    SaO2:   MODE:  Ambulation: 1000 ft   POST:  Rate/Rhythm: 117 ST    82 SR rest  BP:  Supine:   Sitting: 161/50  Standing:    SaO2:  0800-0905 Pt walked 1000 ft with steady gait. No CP. Tolerated well. Education completed with pt and wife who voiced understanding. Stressed importance of brilinta with stent. Needs to see case manager. Reviewed diabetic and heart healthy diets. Discussed NTG use, risk factors, ex ed. Will refer to [facility] Phase 2 with pt's permission.   Graylon Good, RN BSN  02/11/2016 9:01 AM

## 2016-02-11 NOTE — Discharge Summary (Signed)
Discharge Summary    Patient ID: Kelly Hardy,  MRN: [id number], DOB/AGE: 11/29/1960 55 y.o.  Admit date: 02/10/2016 Discharge date: 02/11/2016  Primary Care Provider: Julianne Rice Primary Cardiologist: Dr. Percival Spanish however will transfer care to [facility] after TCM  Discharge Diagnoses    Principal Problem:   Exertional angina [facility]) Active Problems:   Abnormal cardiovascular stress test   CAD in native artery   Hypertension   Hyperlipidemia   Allergies No Known Allergies  Diagnostic Studies/Procedures    Coronary Stent Intervention  Left Heart Cath and Coronary Angiography  Conclusion     The left ventricular systolic function is normal.  LV end diastolic pressure is normal.  Ost LAD lesion, 30 %stenosed.  Prox LAD to Mid LAD lesion, 20 %stenosed.  A STENT XIENCE ALPINE RX [id number] drug eluting stent was successfully placed.  Prox RCA lesion, 95 %stenosed.  Post intervention, there is a 0% residual stenosis.  Normal LV function with an ejection fraction of 65% without focal segmental wall motion abnormalities.  Two vessel coronary obstructive disease with 30% ostial narrowing of the LAD with 20% LAD stenosis after the takeoff of the first diagonal vessel; and 95% focal proximal to mid RCA stenosis and a large caliber RCA.  Normal large ramus intermediate vessel and small-to-moderate left circumflex vessel.  Successful PCI to the RCA with ultimate insertion of a 3.2515 mm Xience Alpine DES stent postdilated to 3.46 mm with the 95% stenosis reduced to 0% and evidence for brisk TIMI-3 flow without evidence for dissection.  RECOMMENDATION: The patient will continue with dual antiplatelet therapy. He will be started on high potency statin with rosuvastatin 40 mg processes. (He had recently been given a prescription for low-dose rosuvastatin, but had not started this.)      History of Present Illness     Kelly Hardy a 55 y.o.male with hx  of HTN (not on any therpay), HLD and DM who recently evalaluted for chest pain. F/ u stress was abnormal. Had SSCP and 32mm ST depression in inferior lateral leads who presented for cath.   He has no past cardiac history though he has very significant cardiovascular risk factors. He did have a stress perfusion study by his description some years ago and was told this was negative. He reports that for about a year he's had increasing lethargy. He works hard and unload trucks. He notices that when he does this he might get some chest tightness. However, he can keep working and the pain will go away. He does not describe radiation to his jaw or to his arms. He thinks it's been a stable pattern over several months. It is moderate in intensity when it happens. He might get slightly nauseated. He doesn't have any shortness of breath, PND or orthopnea. He's not had any resting complaints. He's not sure whether this is similar to what he had before. He doesn't have any resting PND or orthopnea. He's had no presyncope or syncope. He will occasionally get some palpitations. These sound like isolated skipped beats. He drinks a fair amount of caffeine and only sleeps about 5 hours per night.   Hospital Course     Consultants: None  1. CAD/Unstable ungina - Cath showed 95% stenosed Pro RCA s/p DES. Residual 30% ostial narrowing of the LAD with 20% LAD stenosis after the takeoff of the first diagonal vessel. EF of 65% by cath without focal segmental WM abnormality. Scr stable post cath. Troponin of 0.52. EKG showed  NSR this morning. Ambulated without chest pain. Continue ASA, Brillinta and statin. Add BB. CRP II.   2. HLD - LDL 133. Continue high dose statin. Consider OP f/u labs 6-8 weeks given statin initiation this admission.  3. HTN - Intermittently elevated. Will add Toprol XL 25mg .   4. DM - Per PCP.   The patient has been seen by Dr. Percival Spanish  today and deemed ready for discharge home. All follow-up  appointments have been scheduled. Discharge medications are listed below.  He will follow up in [place].   Discharge Vitals Blood pressure (!) 147/66, pulse 93, temperature 97.7 F (36.5 C), temperature source Oral, resp. rate 17, height 5\' 9"  (1.753 m), weight 151 lb 0.2 oz (68.5 kg), SpO2 97 %.  Filed Weights   02/10/16 1148 02/11/16 0420  Weight: 145 lb (65.8 kg) 151 lb 0.2 oz (68.5 kg)    Labs & Radiologic Studies     CBC  Recent Labs  02/09/16 1357 02/11/16 0406  WBC 5.4 5.2  NEUTROABS 3.5  --   HGB 12.7* 11.5*  HCT 38.1* 34.2*  MCV 91.6 89.8  PLT 217 [id number]   Basic Metabolic Panel  Recent Labs  02/09/16 1357 02/11/16 0406  NA 134* 138  K 4.0 3.9  CL 99* 107  CO2 29 25  GLUCOSE 182* 90  BUN 11 11  CREATININE 0.79 0.86  CALCIUM 9.0 8.5*   Liver Function Tests No results for input(s): AST, ALT, ALKPHOS, BILITOT, PROT, ALBUMIN in the last 72 hours. No results for input(s): LIPASE, AMYLASE in the last 72 hours. Cardiac Enzymes  Recent Labs  02/10/16 1826  TROPONINI 0.52*   BNP Invalid input(s): POCBNP D-Dimer No results for input(s): DDIMER in the last 72 hours. Hemoglobin A1C No results for input(s): HGBA1C in the last 72 hours. Fasting Lipid Panel No results for input(s): CHOL, HDL, LDLCALC, TRIG, CHOLHDL, LDLDIRECT in the last 72 hours. Thyroid Function Tests No results for input(s): TSH, T4TOTAL, T3FREE, THYROIDAB in the last 72 hours.  Invalid input(s): FREET3  Dg Chest 2 View  Result Date: 02/09/2016 CLINICAL DATA:  Left upper chest pain EXAM: CHEST  2 VIEW COMPARISON:  None FINDINGS: Cardiomediastinal silhouette is unremarkable. No infiltrate or pleural effusion. No pulmonary edema. Mild degenerative changes mid thoracic spine. IMPRESSION: No active cardiopulmonary disease. Electronically Signed   By: Lahoma Crocker M.D.   On: 02/09/2016 15:00   Nm Myocar Multi W/spect W/wall Motion / Ef  Result Date: 02/09/2016  Blood pressure demonstrated  a hypertensive response to exercise.  Horizontal ST segment depression ST segment depression of 2 mm was noted during stress in the II, III, aVF, V5 and V6 leads.  This is an intermediate risk study.  The left ventricular ejection fraction is normal (55-65%).  Patient had SSCP with stress ECG with 2 mm ST depression in inferior lateal leads Perfusion images with diaphragmatic attenuation no ischemia Discussed with patient and favor diagnostic cath in am    Disposition   Pt is being discharged home today in good condition.  Follow-up Plans & Appointments    Follow-up Information    [place], [place]. Go on 02/18/2016.   Specialties:  Cardiology, Radiology Why:  @2 :30 pm for TCM Contact information: [address] [place] [address] [phone]          Discharge Instructions    Amb Referral to Cardiac Rehabilitation    Complete by:  As directed    Diagnosis:  Coronary Stents  Diet - low sodium heart healthy    Complete by:  As directed    Discharge instructions    Complete by:  As directed    NO HEAVY LIFTING (>10lbs) X 2 WEEKS. NO SEXUAL ACTIVITY X 2 WEEKS. NO DRIVING X 5 WEEK. NO SOAKING BATHS, HOT TUBS, POOLS, ETC., X 7 DAYS.  You can return to work 02/16/16.   Increase activity slowly    Complete by:  As directed       Discharge Medications   Current Discharge Medication List    START taking these medications   Details  metoprolol succinate (TOPROL XL) 25 MG 24 hr tablet Take 1 tablet (25 mg total) by mouth daily. Qty: 30 tablet, Refills: 6    nitroGLYCERIN (NITROSTAT) 0.4 MG SL tablet Place 1 tablet (0.4 mg total) under the tongue every 5 (five) minutes as needed for chest pain. Qty: 25 tablet, Refills: 12    ticagrelor (BRILINTA) 90 MG TABS tablet Take 1 tablet (90 mg total) by mouth 2 (two) times daily. Qty: 60 tablet, Refills: 11      CONTINUE these medications which have CHANGED   Details  rosuvastatin (CRESTOR) 40 MG tablet Take  1 tablet (40 mg total) by mouth at bedtime. Qty: 30 tablet, Refills: 6      CONTINUE these medications which have NOT CHANGED   Details  aspirin EC 81 MG tablet Take 81 mg by mouth daily.    insulin aspart (NOVOLOG) 100 UNIT/ML injection Inject 1-2 Units into the skin daily as needed for high blood sugar.    Multiple Vitamins-Minerals (MULTIVITAMIN WITH MINERALS) tablet Take 1 tablet by mouth daily.    NOVOLOG MIX 70/30 FLEXPEN (70-30) 100 UNIT/ML FlexPen Inject 12 Units into the skin 2 (two) times daily. Refills: 0          Outstanding Labs/Studies   Consider OP f/u labs 6-8 weeks given statin initiation this admission.  Duration of Discharge Encounter   Greater than 30 minutes including physician time.  Signed, Bhagat,Bhavinkumar PA-C 02/11/2016, 9:48 AM  Patient seen and examined.  Plan as discussed in my rounding note for today and outlined above. Jeneen Rinks [facility]  02/11/2016  11:49 AM

## 2016-02-11 NOTE — Progress Notes (Signed)
Patient Name: Kelly Hardy Date of Encounter: 02/11/2016  Primary Cardiologist: Dr. [facility] Problem List     Active Problems:   Exertional angina [facility])   Abnormal cardiovascular stress test   CAD in native artery     Subjective   Feeling well. No chest pain, sob or palpitations.   Inpatient Medications    Scheduled Meds: . aspirin  81 mg Oral Daily  . insulin aspart  0-5 Units Subcutaneous QHS  . insulin aspart  0-9 Units Subcutaneous TID WC  . insulin aspart protamine- aspart  12 Units Subcutaneous BID WC  . rosuvastatin  40 mg Oral q1800  . sodium chloride flush  3 mL Intravenous Q12H  . ticagrelor  90 mg Oral BID   Continuous Infusions: . sodium chloride 100 mL/hr at 02/11/16 0400   PRN Meds: sodium chloride, acetaminophen, diazepam, ondansetron (ZOFRAN) IV, sodium chloride flush   Vital Signs    Vitals:   02/10/16 1923 02/10/16 2000 02/10/16 2100 02/11/16 0420  BP: (!) 137/56 (!) 144/54 (!) 149/66 (!) 142/66  Pulse: 93 63 78 68  Resp: (!) 21 14 15 16   Temp: 97.6 F (36.4 C)   97.5 F (36.4 C)  TempSrc: Oral   Oral  SpO2: 98% 99% 97% 97%  Weight:    151 lb 0.2 oz (68.5 kg)  Height:        Intake/Output Summary (Last 24 hours) at 02/11/16 0706 Last data filed at 02/11/16 [id number]  Gross per 24 hour  Intake             1210 ml  Output             1400 ml  Net             -190 ml   Filed Weights   02/10/16 1148 02/11/16 0420  Weight: 145 lb (65.8 kg) 151 lb 0.2 oz (68.5 kg)    Physical Exam    GEN: Well nourished, well developed, in no acute distress.  HEENT: Grossly normal.  Neck: Supple, no JVD, carotid bruits, or masses. Cardiac: RRR, no murmurs, rubs, or gallops. No clubbing, cyanosis, edema.  Radials/DP/PT 2+ and equal bilaterally. R radial cath site without hematoma or bruit.  Respiratory:  Respirations regular and unlabored, clear to auscultation bilaterally. GI: Soft, nontender, nondistended, BS + x 4. MS: no deformity or  atrophy. Skin: warm and dry, no rash. Neuro:  Strength and sensation are intact. Psych: AAOx3.  Normal affect.  Labs    CBC  Recent Labs  02/09/16 1357 02/11/16 0406  WBC 5.4 5.2  NEUTROABS 3.5  --   HGB 12.7* 11.5*  HCT 38.1* 34.2*  MCV 91.6 89.8  PLT 217 99991111   Basic Metabolic Panel  Recent Labs  02/09/16 1357 02/11/16 0406  NA 134* 138  K 4.0 3.9  CL 99* 107  CO2 29 25  GLUCOSE 182* 90  BUN 11 11  CREATININE 0.79 0.86  CALCIUM 9.0 8.5*   Liver Function Tests No results for input(s): AST, ALT, ALKPHOS, BILITOT, PROT, ALBUMIN in the last 72 hours. No results for input(s): LIPASE, AMYLASE in the last 72 hours. Cardiac Enzymes  Recent Labs  02/10/16 1826  TROPONINI 0.52*     Telemetry    Sinus rhythm at rate of 70-80s, intermittently in 120 - Personally Reviewed  ECG    NSR at rate of 69 bpm with PACs - Personally Reviewed  Radiology    Dg Chest 2 View  Result Date: 02/09/2016 CLINICAL DATA:  Left upper chest pain EXAM: CHEST  2 VIEW COMPARISON:  None FINDINGS: Cardiomediastinal silhouette is unremarkable. No infiltrate or pleural effusion. No pulmonary edema. Mild degenerative changes mid thoracic spine. IMPRESSION: No active cardiopulmonary disease. Electronically Signed   By: Lahoma Crocker M.D.   On: 02/09/2016 15:00   Nm Myocar Multi W/spect W/wall Motion / Ef  Result Date: 02/09/2016  Blood pressure demonstrated a hypertensive response to exercise.  Horizontal ST segment depression ST segment depression of 2 mm was noted during stress in the II, III, aVF, V5 and V6 leads.  This is an intermediate risk study.  The left ventricular ejection fraction is normal (55-65%).  Patient had SSCP with stress ECG with 2 mm ST depression in inferior lateal leads Perfusion images with diaphragmatic attenuation no ischemia Discussed with patient and favor diagnostic cath in am    Cardiac Studies   Coronary Stent Intervention  Left Heart Cath and Coronary  Angiography  Conclusion     The left ventricular systolic function is normal.  LV end diastolic pressure is normal.  Ost LAD lesion, 30 %stenosed.  Prox LAD to Mid LAD lesion, 20 %stenosed.  A STENT XIENCE ALPINE RX [id number] drug eluting stent was successfully placed.  Prox RCA lesion, 95 %stenosed.  Post intervention, there is a 0% residual stenosis.   Normal LV function with an ejection fraction of 65% without focal segmental wall motion abnormalities.  Two vessel coronary obstructive disease with 30% ostial narrowing of the LAD with 20% LAD stenosis after the takeoff of the first diagonal vessel; and 95% focal proximal to mid RCA stenosis and a large caliber RCA.  Normal large ramus intermediate vessel and small-to-moderate left circumflex vessel.  Successful PCI to the RCA with ultimate insertion of a 3.2515 mm Xience Alpine DES stent postdilated to 3.46 mm with the 95% stenosis reduced to 0% and evidence for brisk TIMI-3 flow without evidence for dissection.  RECOMMENDATION: The patient will continue with dual antiplatelet therapy.  He will be started on high potency statin with rosuvastatin 40 mg processes. (He had recently been given a prescription for low-dose rosuvastatin, but had not started this.)      Patient Profile     Kelly Hardy is a 55 y.o. male with hx of HTN (not on any therpay), HLD and DM who recently evalaluted for chest pain. F/ u stress was abnormal. Had SSCP and 34mm ST depression in inferior lateral leads who presented for cath.   Assessment & Plan    1. CAD - Cath showed 95% stenosed Pro RCA s/p DES. Residual 30% ostial narrowing of the LAD with 20% LAD stenosis after the takeoff of the first diagonal vessel. EF of 65% by cath without focal segmental WM abnormality. Scr stable post cath. Troponin of 0.52. EKG showed NSR this morning.  Ambulated with CR today. Continue ASA, Brillinta and statin.   2. HLD - LDL 133. Continue high dose statin.  Consider OP f/u labs 6-8 weeks given statin initiation this admission.  3. HTN - Intermittently elevated. Consider adding BB or ACE/ARB given CAD and DM.   4. DM - Per PCP.   Ambulate and discharge later today.  Signed, Leanor Kail, PA  02/11/2016, 7:06 AM   History and all data above reviewed.  Patient examined.  I agree with the findings as above.  OK to discharge. Start on Toprol XL 25 mg at discharge in addition to meds as listed on MAR.  The  patient exam reveals COR:RRR  ,  Lungs: Clear  ,  Abd: Bowel, Ext No edema, right wrist without bruising or bleeding  .  All available labs, radiology testing, previous records reviewed. Agree with documented assessment and plan. Unstable angina:  PCI of RCA.  Start beta blocker.  Discharge on meds as listed.  He will follow up in [place] with an APP in 7 - 10 days.  He can establish with one of the [facility] MDs since he lives up there.    [name]  8:36 AM  02/11/2016

## 2016-02-15 NOTE — Telephone Encounter (Signed)
Patient contacted regarding discharge from [facility] on 02/11/16.  Patient understands to follow up with provider Kerin Ransom on 02/15/16 at 2:30p at [facility]. Patient understands discharge instructions? Yes Patient understands medications and regiment? Yes Patient understands to bring all medications to this visit? Yes

## 2016-02-18 ENCOUNTER — Encounter: Payer: Self-pay | Admitting: Cardiology

## 2016-02-18 ENCOUNTER — Ambulatory Visit (INDEPENDENT_AMBULATORY_CARE_PROVIDER_SITE_OTHER): Payer: BLUE CROSS/BLUE SHIELD | Admitting: Cardiology

## 2016-02-18 VITALS — BP 148/66 | HR 65 | Ht 69.0 in | Wt 148.0 lb

## 2016-02-18 DIAGNOSIS — Z9861 Coronary angioplasty status: Secondary | ICD-10-CM

## 2016-02-18 DIAGNOSIS — E119 Type 2 diabetes mellitus without complications: Secondary | ICD-10-CM

## 2016-02-18 DIAGNOSIS — R9439 Abnormal result of other cardiovascular function study: Secondary | ICD-10-CM | POA: Diagnosis not present

## 2016-02-18 DIAGNOSIS — I10 Essential (primary) hypertension: Secondary | ICD-10-CM

## 2016-02-18 DIAGNOSIS — Z8249 Family history of ischemic heart disease and other diseases of the circulatory system: Secondary | ICD-10-CM

## 2016-02-18 DIAGNOSIS — I2510 Atherosclerotic heart disease of native coronary artery without angina pectoris: Secondary | ICD-10-CM | POA: Diagnosis not present

## 2016-02-18 DIAGNOSIS — E109 Type 1 diabetes mellitus without complications: Secondary | ICD-10-CM | POA: Insufficient documentation

## 2016-02-18 DIAGNOSIS — E7800 Pure hypercholesterolemia, unspecified: Secondary | ICD-10-CM | POA: Diagnosis not present

## 2016-02-18 DIAGNOSIS — Z794 Long term (current) use of insulin: Secondary | ICD-10-CM

## 2016-02-18 NOTE — Assessment & Plan Note (Addendum)
Stable at home, a little high in the office today

## 2016-02-18 NOTE — Assessment & Plan Note (Signed)
Followed by PCP

## 2016-02-18 NOTE — Assessment & Plan Note (Signed)
Pt admitted for OP cath 02/10/16 after an abnormal Myoview 02/09/16

## 2016-02-18 NOTE — Assessment & Plan Note (Signed)
F had MI in 50's 

## 2016-02-18 NOTE — Assessment & Plan Note (Signed)
On high dose statin Rx 

## 2016-02-18 NOTE — Assessment & Plan Note (Signed)
RCA DES 02/10/16 

## 2016-02-18 NOTE — Patient Instructions (Signed)
Medication Instructions:  Continue current medications  Labwork: Fasting Lipids and CMP  Testing/Procedures: None Ordered  Follow-Up: Your physician recommends that you schedule a follow-up appointment in: 3 Months with Dr Percival Spanish   Any Other Special Instructions Will Be Listed Below (If Applicable).       Happy Thanksgiving   If you need a refill on your cardiac medications before your next appointment, please call your pharmacy.

## 2016-02-18 NOTE — Progress Notes (Signed)
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 OP stress Myoview done 02/09/16 showed diaphragmatic attenuation with 20mm inferior ST depression and chest pain. He was admitted the next day to [facility] for OP cath which revealed a 95% mRCA. He recieved a DES with good results. He is in the office for follow up. He is back to work. He denies any further chest pain and is tolerating his medications well.    Current Outpatient Prescriptions  Medication Sig Dispense Refill  . aspirin EC 81 MG tablet Take 81 mg by mouth daily.    . insulin aspart (NOVOLOG) 100 UNIT/ML injection Inject 1-2 Units into the skin daily as needed for high blood sugar.    . metoprolol succinate (TOPROL XL) 25 MG 24 hr tablet Take 1 tablet (25 mg total) by mouth daily. 30 tablet 6  . Multiple Vitamins-Minerals (MULTIVITAMIN WITH MINERALS) tablet Take 1 tablet by mouth daily.    . nitroGLYCERIN (NITROSTAT) 0.4 MG SL tablet Place 1 tablet (0.4 mg total) under the tongue every 5 (five) minutes as needed for chest pain. 25 tablet 12  . NOVOLOG MIX 70/30 FLEXPEN (70-30) 100 UNIT/ML FlexPen Inject 12 Units into the skin 2 (two) times daily.  0  . rosuvastatin (CRESTOR) 40 MG tablet Take 1 tablet (40 mg total) by mouth at bedtime. 30 tablet 6  . ticagrelor (BRILINTA) 90 MG TABS tablet Take 1 tablet (90 mg total) by mouth 2 (two) times daily. 60 tablet 11   No current facility-administered medications for this visit.     No Known Allergies  Social History   Social History  . Marital status: Married    Spouse name: N/A  . Number of children: N/A  . Years of education: N/A   Occupational History  . Not on file.   Social History Main Topics  . Smoking status: Never Smoker  . Smokeless  tobacco: Never Used  . Alcohol use No  . Drug use: No  . Sexual activity: Yes    Partners: Female   Other Topics Concern  . Not on file   Social History Narrative  . No narrative on file     Review of Systems: General: negative for chills, fever, night sweats or weight changes.  Cardiovascular: negative for chest pain, dyspnea on exertion, edema, orthopnea, palpitations, paroxysmal nocturnal dyspnea or shortness of breath Dermatological: negative for rash Respiratory: negative for cough or wheezing Urologic: negative for hematuria Abdominal: negative for nausea, vomiting, diarrhea, bright red blood per rectum, melena, or hematemesis Neurologic: negative for visual changes, syncope, or dizziness All other systems reviewed and are otherwise negative except as noted above.    Blood pressure (!) 148/66, pulse 65, height 5\' 9"  (1.753 m), weight 148 lb (67.1 kg).  General appearance: alert, cooperative and no distress Neck: no carotid bruit and no JVD Lungs: clear to auscultation bilaterally Heart: regular rate and rhythm Extremities: extremities normal, atraumatic, no cyanosis or edema Skin: Skin color, texture, turgor normal. No rashes or lesions Neurologic: Grossly normal  EKG NSR  ASSESSMENT AND PLAN:   Abnormal cardiovascular stress test Pt admitted for OP cath 02/10/16 after an abnormal Myoview 02/09/16  CAD S/P percutaneous coronary angioplasty RCA DES 02/10/16  Diabetes mellitus type 2, insulin dependent ([place])  Followed by PCP  Hyperlipidemia On high dose statin Rx  Hypertension Stable at home, a little high in the office today  Family history of coronary artery disease in father F had MI in 40's   PLAN  Same Rx. Check f/u CMET and lipids in 3 months then see Dr Percival Spanish.   Kerin Ransom PA-C 02/18/2016 3:13 PM

## 2016-02-21 ENCOUNTER — Encounter: Payer: Self-pay | Admitting: Cardiology

## 2016-03-13 ENCOUNTER — Other Ambulatory Visit: Payer: Self-pay | Admitting: Cardiology

## 2016-03-13 ENCOUNTER — Encounter: Payer: Self-pay | Admitting: Cardiology

## 2016-03-13 MED ORDER — TICAGRELOR 90 MG PO TABS: 90.0000 mg | ORAL_TABLET | Freq: Two times a day (BID) | ORAL | 3 refills | Status: DC

## 2016-05-18 NOTE — Progress Notes (Signed)
Cardiology Office Note   Date:  05/21/2016   ID:  Kelly Hardy, DOB 20-Jan-1961, MRN [id number]  PCP:  Julianne Rice  Cardiologist:   [name], MD  Referring:  Dr. Forde Dandy  Chief Complaint  Patient presents with  . Coronary Artery Disease     History of Present Illness: 56 y/o Caucasian male with complaints of exertional chest pain 02/01/16. OP stress Myoview done 02/09/16 showed diaphragmatic attenuation with 8mm inferior ST depression and chest pain. He was admitted the next day to [facility] for OP cath which revealed a 95% mRCA. He recieved a DES with good results. Since I last saw him he has done well.  He is back to work. 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  The patient denies any new symptoms such as chest discomfort, neck or arm discomfort. There has been no new shortness of breath, PND or orthopnea. There have been no reported palpitations, presyncope or syncope.  Past Medical History:  Diagnosis Date  . Coronary artery disease   . Diabetes mellitus without complication ([place])   . GERD (gastroesophageal reflux disease)   . Hyperlipidemia   . Hypertension   . Palpitations    eval 2014    Past Surgical History:  Procedure Laterality Date  . CARDIAC CATHETERIZATION N/A 02/10/2016   Procedure: Left Heart Cath and Coronary Angiography;  Surgeon: [name], MD;  Location: [place] [facility];  Service: Cardiovascular;  Laterality: N/A;  . CARDIAC CATHETERIZATION N/A 02/10/2016   Procedure: Coronary Stent Intervention;  Surgeon: [name], MD;  Location: [place] [facility];  Service: Cardiovascular;  Laterality: N/A;  . CORONARY STENT PLACEMENT  02/10/2016   STENT XIENCE ALPINE RX 3.25X15 drug eluting stent was successfully placed  . SALIVARY GLAND SURGERY Left    benign     Current Outpatient Prescriptions  Medication Sig Dispense Refill  . aspirin EC 81 MG tablet Take 81 mg by mouth daily.    Marland Kitchen BAYER CONTOUR NEXT TEST test strip CHECK BLOOD SUGAR EIGHT TIMES  DAILY  6  . insulin aspart (NOVOLOG) 100 UNIT/ML injection Inject 1-2 Units into the skin daily as needed for high blood sugar.    . metoprolol succinate (TOPROL XL) 25 MG 24 hr tablet Take 1 tablet (25 mg total) by mouth daily. 30 tablet 6  . Multiple Vitamins-Minerals (MULTIVITAMIN WITH MINERALS) tablet Take 1 tablet by mouth daily.    . nitroGLYCERIN (NITROSTAT) 0.4 MG SL tablet Place 1 tablet (0.4 mg total) under the tongue every 5 (five) minutes as needed for chest pain. 25 tablet 12  . NOVOFINE 32G X 6 MM MISC USE TO ADMINSTER INSULIN SIX TIMES DAILY.  4  . NOVOLOG FLEXPEN 100 UNIT/ML FlexPen INJECT UP TO 30 UNITS PER DAY USING SLIDING SCALES TO TREAT HIGH BLOOD SUGAR AS DIRECTED  5  . NOVOLOG MIX 70/30 FLEXPEN (70-30) 100 UNIT/ML FlexPen Inject 12 Units into the skin 2 (two) times daily.  0  . rosuvastatin (CRESTOR) 40 MG tablet Take 1 tablet (40 mg total) by mouth at bedtime. 30 tablet 6  . ticagrelor (BRILINTA) 90 MG TABS tablet Take 1 tablet (90 mg total) by mouth 2 (two) times daily. 180 tablet 3  . losartan (COZAAR) 25 MG tablet Take 1 tablet (25 mg total) by mouth daily. 90 tablet 3   No current facility-administered medications for this visit.     Allergies:   Patient has no known allergies.    ROS:  Please see the history of present illness.   Otherwise, review of systems are positive for none.   All other systems are reviewed and negative.    PHYSICAL EXAM: VS:  BP (!) 146/70   Pulse 78   Ht 5\' 9"  (1.753 m)   Wt 150 lb (68 kg)   BMI 22.15 kg/m  , BMI Body mass index is 22.15 kg/m. GENERAL:  Well appearing NECK:  No jugular venous distention, waveform within normal limits, carotid upstroke brisk and symmetric, no bruits, no thyromegaly LUNGS:  Clear to auscultation bilaterally BACK:  No CVA tenderness CHEST:  Unremarkable HEART:  PMI not displaced or sustained,S1 and S2 within normal limits, no S3, no S4, no clicks, no rubs, no murmurs ABD:  Flat, positive bowel  sounds normal in frequency in pitch, no bruits, no rebound, no guarding, no midline pulsatile mass, no hepatomegaly, no splenomegaly EXT:  2 plus pulses throughout, no edema, no cyanosis no clubbing   EKG:  EKG is not ordered today.   Recent Labs: 02/11/2016: BUN 11; Creatinine, Ser 0.86; Hemoglobin 11.5; Platelets 189; Potassium 3.9; Sodium 138      Wt Readings from Last 3 Encounters:  05/19/16 150 lb (68 kg)  02/18/16 148 lb (67.1 kg)  02/11/16 151 lb 0.2 oz (68.5 kg)     Other studies Reviewed: Additional studies/ records that were reviewed today include: Office records. Review of the above records demonstrates:  Please see elsewhere in the note.     ASSESSMENT AND PLAN:  CAD:  The patient has no new sypmtoms.  No further cardiovascular testing is indicated.  We will continue with aggressive risk reduction and meds as listed.  DYSLIPIDEMIA:  His last LDL before starting Crestor in October was 133. He's going to get another level per Dr. Forde Dandy soon. He might need Zetia. Also he might be a good candidate for one of our PCSK9 studies but I would coordinate this with Dr. Forde Dandy.  HTN:   His BP is not at target.  I will add Cozaar 25 mg daily.   DM:  Per Julianne Rice   Current medicines are reviewed at length with the patient today.  The patient does not have concerns regarding medicines.  The following changes have been made:  no change  Labs/ tests ordered today include:     Orders Placed This Encounter  Procedures  . Hepatic function panel  . Lipid panel     Disposition:   FU with me in 6 months.     Signed, [name], MD  05/21/2016 8:14 PM    [facility]

## 2016-05-19 ENCOUNTER — Encounter: Payer: Self-pay | Admitting: Cardiology

## 2016-05-19 ENCOUNTER — Ambulatory Visit (INDEPENDENT_AMBULATORY_CARE_PROVIDER_SITE_OTHER): Payer: BLUE CROSS/BLUE SHIELD | Admitting: Cardiology

## 2016-05-19 VITALS — BP 146/70 | HR 78 | Ht 69.0 in | Wt 150.0 lb

## 2016-05-19 DIAGNOSIS — I25119 Atherosclerotic heart disease of native coronary artery with unspecified angina pectoris: Secondary | ICD-10-CM

## 2016-05-19 DIAGNOSIS — I10 Essential (primary) hypertension: Secondary | ICD-10-CM

## 2016-05-19 DIAGNOSIS — E785 Hyperlipidemia, unspecified: Secondary | ICD-10-CM

## 2016-05-19 MED ORDER — LOSARTAN POTASSIUM 25 MG PO TABS: 25.0000 mg | ORAL_TABLET | Freq: Every day | ORAL | 3 refills | Status: DC

## 2016-05-19 NOTE — Patient Instructions (Addendum)
Medication Instructions:  START- Losartan 25 mg daily  Labwork: Fasting Lipids Liver  Testing/Procedures: None Ordered  Follow-Up: Your physician recommends that you schedule a follow-up appointment in: 6 Months   Any Other Special Instructions Will Be Listed Below (If Applicable).   If you need a refill on your cardiac medications before your next appointment, please call your pharmacy.

## 2016-05-21 ENCOUNTER — Encounter: Payer: Self-pay | Admitting: Cardiology

## 2016-08-25 ENCOUNTER — Other Ambulatory Visit: Payer: Self-pay | Admitting: Physician Assistant

## 2016-08-25 NOTE — Telephone Encounter (Signed)
Rx has been sent to the pharmacy electronically. ° °

## 2016-10-05 ENCOUNTER — Other Ambulatory Visit: Payer: Self-pay | Admitting: Physician Assistant

## 2016-10-25 ENCOUNTER — Telehealth: Payer: Self-pay | Admitting: Cardiology

## 2016-10-25 NOTE — Telephone Encounter (Signed)
New Message     Diabetic had low sugar drop and when he got back up at 630a he got really dizzy when he got out of bed and it lasted until 1030a , it went away after he got something to eat and drink, wants to follow up and get your opinion

## 2016-10-25 NOTE — Telephone Encounter (Signed)
Returned call to patient He had a sugar low around 0400 He ate something and went back to bed Around 630 when he got out of bed, he was very dizzy He laid down on couch but for next 3 hrs he had trouble getting up After about 3 hours he started to feel better, but a little lethargic His PCP suggested he may have been dehydrated He states he has had a few dizzy episodes at work when changing position He reports his BP was 130/68 HR 60 Advised to monitor VS if he has recurrent episodes Advised hydration - he drinks a lot of Diet Mt. Dew Advised to call back if he has recurrent episodes with associated low BP, low/high HR

## 2016-11-13 ENCOUNTER — Encounter: Payer: Self-pay | Admitting: Cardiology

## 2016-11-16 ENCOUNTER — Other Ambulatory Visit: Payer: Self-pay | Admitting: Cardiology

## 2016-11-16 ENCOUNTER — Telehealth: Payer: Self-pay | Admitting: Cardiology

## 2016-11-16 MED ORDER — METOPROLOL SUCCINATE ER 25 MG PO TB24: 25.0000 mg | ORAL_TABLET | Freq: Every day | ORAL | 1 refills | Status: DC

## 2016-11-16 NOTE — Telephone Encounter (Signed)
Rx sent to preferred pharmacy electronically.

## 2016-11-16 NOTE — Telephone Encounter (Signed)
New message    *STAT* If patient is at the pharmacy, call can be transferred to refill team.   1. Which medications need to be refilled? (please list name of each medication and dose if known) metoprolol succinate (TOPROL XL) 25 MG 24 hr tablet  2. Which pharmacy/location (including street and city if local pharmacy) is medication to be sent to? [facility] in [place]  3. Do they need a 30 day or 90 day supply? 90 day supply

## 2016-11-29 NOTE — Progress Notes (Signed)
Cardiology Office Note   Date:  11/30/2016   ID:  Kelly Hardy, DOB 1960-06-25, MRN [id number]  PCP:  [name], MD  Cardiologist:   [name], MD  Referring:  Dr. Forde Dandy  Chief Complaint  Patient presents with  . Coronary Artery Disease     History of Present Illness: 56 y/o Caucasian male with complaints of exertional chest pain 02/01/16. OP stress Myoview done 02/09/16 showed diaphragmatic attenuation with 61mm inferior ST depression and chest pain. He was admitted the next day to [facility] for OP cath which revealed a 95% mRCA. He recieved a DES with good results. Since I last saw him he he has done well.  He has rare palpitations.  He did have an episode of dizziness in July because about this. Lasted for about 3 days and then slowly resolved. He didn't have any palpitations associated with this and there was no syncope or presyncope. He's not had any of the chest discomfort that he had prior to his stent. He's not required any nitroglycerin. He denies any shortness of breath, PND or orthopnea.   Past Medical History:  Diagnosis Date  . Coronary artery disease   . Diabetes mellitus without complication ([place])   . GERD (gastroesophageal reflux disease)   . Hyperlipidemia   . Hypertension   . Palpitations    eval 2014    Past Surgical History:  Procedure Laterality Date  . CARDIAC CATHETERIZATION N/A 02/10/2016   Procedure: Left Heart Cath and Coronary Angiography;  Surgeon: [name], MD;  Location: [place] [facility];  Service: Cardiovascular;  Laterality: N/A;  . CARDIAC CATHETERIZATION N/A 02/10/2016   Procedure: Coronary Stent Intervention;  Surgeon: [name], MD;  Location: [place] [facility];  Service: Cardiovascular;  Laterality: N/A;  . CORONARY STENT PLACEMENT  02/10/2016   STENT XIENCE ALPINE RX 3.25X15 drug eluting stent was successfully placed  . SALIVARY GLAND SURGERY Left    benign     Current Outpatient Prescriptions  Medication Sig  Dispense Refill  . aspirin EC 81 MG tablet Take 81 mg by mouth daily.    Marland Kitchen BAYER CONTOUR NEXT TEST test strip CHECK BLOOD SUGAR EIGHT TIMES DAILY  6  . insulin lispro protamine-lispro (HUMALOG 75/25 MIX) (75-25) 100 UNIT/ML SUSP injection Inject 12 Units into the skin 2 (two) times daily with a meal.    . metoprolol succinate (TOPROL XL) 25 MG 24 hr tablet Take 1 tablet (25 mg total) by mouth daily. 90 tablet 1  . Multiple Vitamins-Minerals (MULTIVITAMIN WITH MINERALS) tablet Take 1 tablet by mouth daily.    . nitroGLYCERIN (NITROSTAT) 0.4 MG SL tablet Place 1 tablet (0.4 mg total) under the tongue every 5 (five) minutes as needed for chest pain. 25 tablet 12  . NOVOFINE 32G X 6 MM MISC USE TO ADMINSTER INSULIN SIX TIMES DAILY.  4  . rosuvastatin (CRESTOR) 40 MG tablet TAKE ONE TABLET BY MOUTH AT BEDTIME. 30 tablet 11  . ticagrelor (BRILINTA) 90 MG TABS tablet Take 1 tablet (90 mg total) by mouth 2 (two) times daily. 180 tablet 3  . losartan (COZAAR) 25 MG tablet Take 1 tablet (25 mg total) by mouth daily. 90 tablet 3  . sildenafil (VIAGRA) 50 MG tablet Take 1 tablet (50 mg total) by mouth daily as needed for erectile dysfunction. 15 tablet 0   No current facility-administered medications for this visit.     Allergies:   Patient has no known allergies.  ROS:  Please see the history of present illness.   Otherwise, review of systems are positive for ED.   All other systems are reviewed and negative.    PHYSICAL EXAM: VS:  BP (!) 136/54   Pulse 68   Ht 5\' 9"  (1.753 m)   Wt 151 lb (68.5 kg)   BMI 22.30 kg/m  , BMI Body mass index is 22.3 kg/m.  GENERAL:  Well appearing NECK:  No jugular venous distention, waveform within normal limits, carotid upstroke brisk and symmetric, no bruits, no thyromegaly LUNGS:  Clear to auscultation bilaterally CHEST:  Unremarkable HEART:  PMI not displaced or sustained,S1 and S2 within normal limits, no S3, no S4, no clicks, no rubs, no murmurs ABD:   Flat, positive bowel sounds normal in frequency in pitch, no bruits, no rebound, no guarding, no midline pulsatile mass, no hepatomegaly, no splenomegaly EXT:  2 plus pulses throughout, no edema, no cyanosis no clubbing    EKG:  EKG is  ordered today. Sinus rhythm, rate 68, axis within normal limits, intervals within normal limits, no acute ST changes.  Recent Labs: 02/11/2016: BUN 11; Creatinine, Ser 0.86; Hemoglobin 11.5; Platelets 189; Potassium 3.9; Sodium 138      Wt Readings from Last 3 Encounters:  11/30/16 151 lb (68.5 kg)  05/19/16 150 lb (68 kg)  02/18/16 148 lb (67.1 kg)     Other studies Reviewed: Additional studies/ records that were reviewed today include: None Review of the above records demonstrates:    ASSESSMENT AND PLAN:  CAD:  The patient has no new sypmtoms.  No further cardiovascular testing is indicated.  We will continue with aggressive risk reduction and meds as listed.  He can stop is Brilinta in Nov at the one year mark.    He will continue with risk reduction.   DYSLIPIDEMIA:    He tolerates the Crestor.  He will continue with this.  HTN:   His BP is OK.  I started Cozaar at the last visit and he tolerates this.  He will continue.   DM:  He thinks the A1C was 8.1.   Per [name], MD   ED:  I did write a prescription for Viagra.  He is not using NTG and knows that he cannot use both.    Current medicines are reviewed at length with the patient today.  The patient does not have concerns regarding medicines.  The following changes have been made:  As above  Labs/ tests ordered today include:   None  Orders Placed This Encounter  Procedures  . EKG 12-Lead     Disposition:   FU with me in 12 months.     Signed, [name], MD  11/30/2016 10:07 AM    [facility]

## 2016-11-30 ENCOUNTER — Ambulatory Visit (INDEPENDENT_AMBULATORY_CARE_PROVIDER_SITE_OTHER): Payer: Commercial Managed Care - PPO | Admitting: Cardiology

## 2016-11-30 ENCOUNTER — Encounter: Payer: Self-pay | Admitting: Cardiology

## 2016-11-30 VITALS — BP 136/54 | HR 68 | Ht 69.0 in | Wt 151.0 lb

## 2016-11-30 DIAGNOSIS — E785 Hyperlipidemia, unspecified: Secondary | ICD-10-CM | POA: Diagnosis not present

## 2016-11-30 DIAGNOSIS — N529 Male erectile dysfunction, unspecified: Secondary | ICD-10-CM | POA: Diagnosis not present

## 2016-11-30 DIAGNOSIS — I10 Essential (primary) hypertension: Secondary | ICD-10-CM

## 2016-11-30 DIAGNOSIS — I2510 Atherosclerotic heart disease of native coronary artery without angina pectoris: Secondary | ICD-10-CM | POA: Insufficient documentation

## 2016-11-30 MED ORDER — SILDENAFIL CITRATE 50 MG PO TABS: 50.0000 mg | ORAL_TABLET | Freq: Every day | ORAL | 0 refills | Status: DC | PRN

## 2016-11-30 NOTE — Patient Instructions (Signed)
Medication Instructions:  STOP- Brilinta in November  If you need a refill on your cardiac medications before your next appointment, please call your pharmacy.  Labwork: None Ordered   Testing/Procedures: None Ordered  Follow-Up: Your physician wants you to follow-up in: 1 Year. You should receive a reminder letter in the mail two months in advance. If you do not receive a letter, please call our office [phone].    Thank you for choosing [facility] at [facility]!!

## 2017-03-09 ENCOUNTER — Other Ambulatory Visit: Payer: Self-pay | Admitting: Cardiology

## 2017-03-15 ENCOUNTER — Encounter: Payer: Self-pay | Admitting: Cardiology

## 2017-03-15 ENCOUNTER — Telehealth: Payer: Self-pay | Admitting: *Deleted

## 2017-03-15 NOTE — Telephone Encounter (Signed)
He can see me this week either Thursday or Friday.  He needs to present to the ED if this increases in severity.

## 2017-03-15 NOTE — Telephone Encounter (Signed)
The patient sent in an e-mail stating that he was having exertional chest discomfort. 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 He also complains about lack of sleep.   He stated that he has not been taking his Asprin lately and has been advised to start taking it again. He stated that this has occurred in the past, got better and has restarted again. He would ike to know if he needs to be seen by Dr. Percival Spanish. Message routed to him for his recommendation.

## 2017-03-16 NOTE — Telephone Encounter (Signed)
Returned the call to the patient. He has an appointment with Dr. Percival Spanish 12/21 at 8 am. He verbalized his understanding to go to the ED if the chest discomfort increases.

## 2017-03-21 NOTE — Progress Notes (Signed)
Cardiology Office Note   Date:  03/23/2017   ID:  Kelly Hardy, DOB 1961/03/13, MRN [id number]  PCP:  Kelly Hilding, MD  Cardiologist:   [name], MD  Referring:  Dr. Forde Dandy  Chief Complaint  Patient presents with  . Chest Pain     History of Present Illness: 56 y/o Caucasian male with complaints of exertional chest pain 02/01/16. OP stress Myoview done 02/09/16 showed diaphragmatic attenuation with 13mm inferior ST depression and chest pain. He was admitted the next day to [facility] for OP cath which revealed a 95% mRCA. He recieved a DES with good results. Since I last saw him he has developed new onset exertional chest discomfort.  This is just like his previous angina.  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   He notices that carrying something he will develop mid discomfort that can be 7 out of 10 in intensity.  He stops and it goes away.  He had some mild nausea.  It radiates across his chest but not his arms or into his neck.  He has not had any resting discomfort.  Is not actually taken any nitroglycerin.  He has had a little fatigue after he is been working which is unusual for him.  He has not had any presyncope or syncope.  He said no PND or orthopnea.   Past Medical History:  Diagnosis Date  . Coronary artery disease   . Diabetes mellitus without complication ([place])   . GERD (gastroesophageal reflux disease)   . Hyperlipidemia   . Hypertension   . Palpitations    eval 2014    Past Surgical History:  Procedure Laterality Date  . CARDIAC CATHETERIZATION N/A 02/10/2016   Procedure: Left Heart Cath and Coronary Angiography;  Surgeon: [name], MD;  Location: [place] [facility];  Service: Cardiovascular;  Laterality: N/A;  . CARDIAC CATHETERIZATION N/A 02/10/2016   Procedure: Coronary Stent Intervention;  Surgeon: [name], MD;  Location: [place] [facility];  Service: Cardiovascular;  Laterality:  N/A;  . CORONARY STENT PLACEMENT  02/10/2016   STENT XIENCE ALPINE RX 3.25X15 drug eluting stent was successfully placed  . SALIVARY GLAND SURGERY Left    benign     Current Outpatient Medications  Medication Sig Dispense Refill  . aspirin EC 81 MG tablet Take 81 mg by mouth daily.    Marland Kitchen BAYER CONTOUR NEXT TEST test strip CHECK BLOOD SUGAR EIGHT TIMES DAILY  6  . insulin lispro protamine-lispro (HUMALOG 75/25 MIX) (75-25) 100 UNIT/ML SUSP injection Inject 12 Units into the skin 2 (two) times daily with a meal.    . metoprolol succinate (TOPROL-XL) 25 MG 24 hr tablet TAKE ONE TABLET BY MOUTH DAILY 90 tablet 3  . Multiple Vitamins-Minerals (MULTIVITAMIN WITH MINERALS) tablet Take 1 tablet by mouth daily.    . nitroGLYCERIN (NITROSTAT) 0.4 MG SL tablet Place 1 tablet (0.4 mg total) under the tongue every 5 (five) minutes as needed for chest pain. 25 tablet 12  . NOVOFINE 32G X 6 MM MISC USE TO ADMINSTER INSULIN SIX TIMES DAILY.  4  . rosuvastatin (CRESTOR) 40 MG tablet TAKE ONE TABLET BY MOUTH AT BEDTIME. 30 tablet 11  . sildenafil (VIAGRA) 50 MG tablet Take 1 tablet (50 mg total) by mouth daily as needed for erectile dysfunction. 15 tablet 0  . ticagrelor (BRILINTA) 90 MG TABS tablet Take 1 tablet (90  mg total) by mouth 2 (two) times daily. 180 tablet 3  . losartan (COZAAR) 25 MG tablet Take 1 tablet (25 mg total) by mouth daily. 90 tablet 3   No current facility-administered medications for this visit.     Allergies:   Patient has no known allergies.    ROS:  Please see the history of present illness.   Otherwise, review of systems are positive for none.   All other systems are reviewed and negative.    PHYSICAL EXAM: VS:  BP 132/70   Pulse 78   Ht 5\' 9"  (1.753 m)   Wt 149 lb (67.6 kg)   BMI 22.00 kg/m  , BMI Body mass index is 22 kg/m.  GENERAL:  Well appearing NECK:  No jugular venous distention, waveform within normal limits, carotid upstroke brisk and symmetric, no bruits,  no thyromegaly LUNGS:  Clear to auscultation bilaterally CHEST:  Unremarkable HEART:  PMI not displaced or sustained,S1 and S2 within normal limits, no S3, no S4, no clicks, no rubs, no murmurs ABD:  Flat, positive bowel sounds normal in frequency in pitch, no bruits, no rebound, no guarding, no midline pulsatile mass, no hepatomegaly, no splenomegaly EXT:  2 plus pulses throughout, no edema, no cyanosis no clubbing   EKG:  EKG is  ordered today. Sinus rhythm, rate 78, axis within normal limits, intervals within normal limits, no acute ST changes.  Recent Labs: No results found for requested labs within last 8760 hours.      Wt Readings from Last 3 Encounters:  03/23/17 149 lb (67.6 kg)  11/30/16 151 lb (68.5 kg)  05/19/16 150 lb (68 kg)     Other studies Reviewed: Additional studies/ records that were reviewed today include: None Review of the above records demonstrates:    ASSESSMENT AND PLAN:  CAD: The patient has new onset exertional angina.  There is a very high probability of obstructive coronary disease.  Cardiac catheterization is indicated. The patient understands that risks included but are not limited to stroke (1 in 1000), death (1 in 68), kidney failure [usually temporary] (1 in 500), bleeding (1 in 200), allergic reaction [possibly serious] (1 in 200).  The patient understands and agrees to proceed.   DYSLIPIDEMIA:   LDL recently was 61 with an HDL of 42.  He will remain on the meds as listed.  He tolerates the Crestor.  He will continue with this.  HTN:   His BP is OK.  The current medical regimen is effective;  continue present plan and medications.  DM:  This was up to 9.0 from 8.1.  This is followed by Kelly Hilding, MD and will need better control.  We will need to consider Jardiance.   PALPITATIONS:  He mentioned this and we will talk about this after the cardiac cath.    Current medicines are reviewed at length with the patient today.  The patient does  not have concerns regarding medicines.  The following changes have been made:    Labs/ tests ordered today include:   Cath  No orders of the defined types were placed in this encounter.    Disposition:   FU with me after the cath.   Signed, [name], MD  03/23/2017 8:50 AM    [facility]

## 2017-03-21 NOTE — H&P (View-Only) (Signed)
Cardiology Office Note   Date:  03/23/2017   ID:  Kelly Hardy, DOB 1960-12-04, MRN [id number]  PCP:  [name], MD  Cardiologist:   [name], MD  Referring:  Dr. Forde Dandy  Chief Complaint  Patient presents with  . Chest Pain     History of Present Illness: 56 y/o Caucasian male with complaints of exertional chest pain 02/01/16. OP stress Myoview done 02/09/16 showed diaphragmatic attenuation with 61mm inferior ST depression and chest pain. He was admitted the next day to [facility] for OP cath which revealed a 95% mRCA. He recieved a DES with good results. Since I last saw him he has developed new onset exertional chest discomfort.  This is just like his previous angina.  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   He notices that carrying something he will develop mid discomfort that can be 7 out of 10 in intensity.  He stops and it goes away.  He had some mild nausea.  It radiates across his chest but not his arms or into his neck.  He has not had any resting discomfort.  Is not actually taken any nitroglycerin.  He has had a little fatigue after he is been working which is unusual for him.  He has not had any presyncope or syncope.  He said no PND or orthopnea.   Past Medical History:  Diagnosis Date  . Coronary artery disease   . Diabetes mellitus without complication ([place])   . GERD (gastroesophageal reflux disease)   . Hyperlipidemia   . Hypertension   . Palpitations    eval 2014    Past Surgical History:  Procedure Laterality Date  . CARDIAC CATHETERIZATION N/A 02/10/2016   Procedure: Left Heart Cath and Coronary Angiography;  Surgeon: [name], MD;  Location: [place] [facility];  Service: Cardiovascular;  Laterality: N/A;  . CARDIAC CATHETERIZATION N/A 02/10/2016   Procedure: Coronary Stent Intervention;  Surgeon: [name], MD;  Location: [place] [facility];  Service: Cardiovascular;  Laterality:  N/A;  . CORONARY STENT PLACEMENT  02/10/2016   STENT XIENCE ALPINE RX 3.25X15 drug eluting stent was successfully placed  . SALIVARY GLAND SURGERY Left    benign     Current Outpatient Medications  Medication Sig Dispense Refill  . aspirin EC 81 MG tablet Take 81 mg by mouth daily.    Marland Kitchen BAYER CONTOUR NEXT TEST test strip CHECK BLOOD SUGAR EIGHT TIMES DAILY  6  . insulin lispro protamine-lispro (HUMALOG 75/25 MIX) (75-25) 100 UNIT/ML SUSP injection Inject 12 Units into the skin 2 (two) times daily with a meal.    . metoprolol succinate (TOPROL-XL) 25 MG 24 hr tablet TAKE ONE TABLET BY MOUTH DAILY 90 tablet 3  . Multiple Vitamins-Minerals (MULTIVITAMIN WITH MINERALS) tablet Take 1 tablet by mouth daily.    . nitroGLYCERIN (NITROSTAT) 0.4 MG SL tablet Place 1 tablet (0.4 mg total) under the tongue every 5 (five) minutes as needed for chest pain. 25 tablet 12  . NOVOFINE 32G X 6 MM MISC USE TO ADMINSTER INSULIN SIX TIMES DAILY.  4  . rosuvastatin (CRESTOR) 40 MG tablet TAKE ONE TABLET BY MOUTH AT BEDTIME. 30 tablet 11  . sildenafil (VIAGRA) 50 MG tablet Take 1 tablet (50 mg total) by mouth daily as needed for erectile dysfunction. 15 tablet 0  . ticagrelor (BRILINTA) 90 MG TABS tablet Take 1 tablet (90  mg total) by mouth 2 (two) times daily. 180 tablet 3  . losartan (COZAAR) 25 MG tablet Take 1 tablet (25 mg total) by mouth daily. 90 tablet 3   No current facility-administered medications for this visit.     Allergies:   Patient has no known allergies.    ROS:  Please see the history of present illness.   Otherwise, review of systems are positive for none.   All other systems are reviewed and negative.    PHYSICAL EXAM: VS:  BP 132/70   Pulse 78   Ht 5\' 9"  (1.753 m)   Wt 149 lb (67.6 kg)   BMI 22.00 kg/m  , BMI Body mass index is 22 kg/m.  GENERAL:  Well appearing NECK:  No jugular venous distention, waveform within normal limits, carotid upstroke brisk and symmetric, no bruits,  no thyromegaly LUNGS:  Clear to auscultation bilaterally CHEST:  Unremarkable HEART:  PMI not displaced or sustained,S1 and S2 within normal limits, no S3, no S4, no clicks, no rubs, no murmurs ABD:  Flat, positive bowel sounds normal in frequency in pitch, no bruits, no rebound, no guarding, no midline pulsatile mass, no hepatomegaly, no splenomegaly EXT:  2 plus pulses throughout, no edema, no cyanosis no clubbing   EKG:  EKG is  ordered today. Sinus rhythm, rate 78, axis within normal limits, intervals within normal limits, no acute ST changes.  Recent Labs: No results found for requested labs within last 8760 hours.      Wt Readings from Last 3 Encounters:  03/23/17 149 lb (67.6 kg)  11/30/16 151 lb (68.5 kg)  05/19/16 150 lb (68 kg)     Other studies Reviewed: Additional studies/ records that were reviewed today include: None Review of the above records demonstrates:    ASSESSMENT AND PLAN:  CAD: The patient has new onset exertional angina.  There is a very high probability of obstructive coronary disease.  Cardiac catheterization is indicated. The patient understands that risks included but are not limited to stroke (1 in 1000), death (1 in 71), kidney failure [usually temporary] (1 in 500), bleeding (1 in 200), allergic reaction [possibly serious] (1 in 200).  The patient understands and agrees to proceed.   DYSLIPIDEMIA:   LDL recently was 61 with an HDL of 42.  He will remain on the meds as listed.  He tolerates the Crestor.  He will continue with this.  HTN:   His BP is OK.  The current medical regimen is effective;  continue present plan and medications.  DM:  This was up to 9.0 from 8.1.  This is followed by [name], MD and will need better control.  We will need to consider Jardiance.   PALPITATIONS:  He mentioned this and we will talk about this after the cardiac cath.    Current medicines are reviewed at length with the patient today.  The patient does  not have concerns regarding medicines.  The following changes have been made:    Labs/ tests ordered today include:   Cath  No orders of the defined types were placed in this encounter.    Disposition:   FU with me after the cath.   Signed, [name], MD  03/23/2017 8:50 AM    [facility]

## 2017-03-23 ENCOUNTER — Encounter (HOSPITAL_COMMUNITY)
Admission: RE | Disposition: A | Discharge status: Home / Self Care | Payer: Self-pay | Source: Ambulatory Visit | Attending: Cardiology

## 2017-03-23 ENCOUNTER — Encounter (HOSPITAL_COMMUNITY): Payer: Self-pay

## 2017-03-23 ENCOUNTER — Other Ambulatory Visit: Payer: Self-pay

## 2017-03-23 ENCOUNTER — Encounter: Payer: Self-pay | Admitting: Cardiology

## 2017-03-23 ENCOUNTER — Ambulatory Visit (INDEPENDENT_AMBULATORY_CARE_PROVIDER_SITE_OTHER): Payer: Commercial Managed Care - PPO | Admitting: Cardiology

## 2017-03-23 ENCOUNTER — Observation Stay (HOSPITAL_COMMUNITY)
Admission: RE | Admit: 2017-03-23 | Discharge: 2017-03-24 | Disposition: A | Discharge status: Home / Self Care | Payer: Commercial Managed Care - PPO | Source: Ambulatory Visit | Attending: Cardiology | Admitting: Cardiology

## 2017-03-23 VITALS — BP 132/70 | HR 78 | Ht 69.0 in | Wt 149.0 lb

## 2017-03-23 DIAGNOSIS — R002 Palpitations: Secondary | ICD-10-CM | POA: Diagnosis not present

## 2017-03-23 DIAGNOSIS — I25110 Atherosclerotic heart disease of native coronary artery with unstable angina pectoris: Principal | ICD-10-CM | POA: Insufficient documentation

## 2017-03-23 DIAGNOSIS — I200 Unstable angina: Secondary | ICD-10-CM | POA: Diagnosis not present

## 2017-03-23 DIAGNOSIS — I2584 Coronary atherosclerosis due to calcified coronary lesion: Secondary | ICD-10-CM | POA: Insufficient documentation

## 2017-03-23 DIAGNOSIS — Z794 Long term (current) use of insulin: Secondary | ICD-10-CM | POA: Diagnosis not present

## 2017-03-23 DIAGNOSIS — E119 Type 2 diabetes mellitus without complications: Secondary | ICD-10-CM

## 2017-03-23 DIAGNOSIS — Z955 Presence of coronary angioplasty implant and graft: Secondary | ICD-10-CM | POA: Diagnosis not present

## 2017-03-23 DIAGNOSIS — E785 Hyperlipidemia, unspecified: Secondary | ICD-10-CM | POA: Diagnosis present

## 2017-03-23 DIAGNOSIS — Z79899 Other long term (current) drug therapy: Secondary | ICD-10-CM | POA: Diagnosis not present

## 2017-03-23 DIAGNOSIS — E109 Type 1 diabetes mellitus without complications: Secondary | ICD-10-CM

## 2017-03-23 DIAGNOSIS — E118 Type 2 diabetes mellitus with unspecified complications: Secondary | ICD-10-CM | POA: Diagnosis not present

## 2017-03-23 DIAGNOSIS — I10 Essential (primary) hypertension: Secondary | ICD-10-CM

## 2017-03-23 DIAGNOSIS — Z9861 Coronary angioplasty status: Secondary | ICD-10-CM

## 2017-03-23 DIAGNOSIS — I208 Other forms of angina pectoris: Secondary | ICD-10-CM | POA: Diagnosis present

## 2017-03-23 DIAGNOSIS — I2510 Atherosclerotic heart disease of native coronary artery without angina pectoris: Secondary | ICD-10-CM

## 2017-03-23 DIAGNOSIS — Z7982 Long term (current) use of aspirin: Secondary | ICD-10-CM | POA: Insufficient documentation

## 2017-03-23 DIAGNOSIS — R079 Chest pain, unspecified: Secondary | ICD-10-CM | POA: Diagnosis present

## 2017-03-23 HISTORY — PX: LEFT HEART CATH AND CORONARY ANGIOGRAPHY: CATH118249

## 2017-03-23 LAB — BASIC METABOLIC PANEL
Anion gap: 6 (ref 5–15)
BUN: 11 mg/dL (ref 6–20)
CALCIUM: 9.3 mg/dL (ref 8.9–10.3)
CO2: 27 mmol/L (ref 22–32)
Chloride: 103 mmol/L (ref 101–111)
Creatinine, Ser: 0.91 mg/dL (ref 0.61–1.24)
GFR calc Af Amer: >60 mL/min (ref >60)
GLUCOSE: 219 mg/dL — AB (ref 65–99)
Potassium: 3.6 mmol/L (ref 3.5–5.1)
SODIUM: 136 mmol/L (ref 135–145)

## 2017-03-23 LAB — CBC
HCT: 38.7 % — ABNORMAL LOW (ref 39.0–52.0)
Hemoglobin: 13 g/dL (ref 13.0–17.0)
MCH: 30.3 pg (ref 26.0–34.0)
MCHC: 33.6 g/dL (ref 30.0–36.0)
MCV: 90.2 fL (ref 78.0–100.0)
PLATELETS: 208 10*3/uL (ref 150–400)
RBC: 4.29 MIL/uL (ref 4.22–5.81)
RDW: 12.8 % (ref 11.5–15.5)
WBC: 5.9 10*3/uL (ref 4.0–10.5)

## 2017-03-23 LAB — GLUCOSE, CAPILLARY
Glucose-Capillary: 215 mg/dL — ABNORMAL HIGH (ref 65–99)
Glucose-Capillary: 128 mg/dL — ABNORMAL HIGH (ref 65–99)

## 2017-03-23 LAB — PROTIME-INR
INR: 0.98
PROTHROMBIN TIME: 12.9 s (ref 11.4–15.2)

## 2017-03-23 SURGERY — LEFT HEART CATH AND CORONARY ANGIOGRAPHY
Anesthesia: LOCAL

## 2017-03-23 MED ORDER — SODIUM CHLORIDE 0.9 % IV SOLN: 250.0000 mL | INTRAVENOUS | Status: DC | PRN

## 2017-03-23 MED ORDER — MIDAZOLAM HCL 2 MG/2ML IJ SOLN
INTRAMUSCULAR | Status: AC
  Filled 2017-03-23: qty 2

## 2017-03-23 MED ORDER — ASPIRIN 81 MG PO CHEW
CHEWABLE_TABLET | ORAL | Status: AC
  Administered 2017-03-23: 81 mg via ORAL
  Filled 2017-03-23: qty 1

## 2017-03-23 MED ORDER — NITROGLYCERIN 0.4 MG SL SUBL: 0.4000 mg | SUBLINGUAL_TABLET | SUBLINGUAL | Status: DC | PRN

## 2017-03-23 MED ORDER — HEPARIN (PORCINE) IN NACL 2-0.9 UNIT/ML-% IJ SOLN
INTRAMUSCULAR | Status: AC | PRN
  Administered 2017-03-23: 1000 mL via INTRA_ARTERIAL

## 2017-03-23 MED ORDER — HEPARIN (PORCINE) IN NACL 2-0.9 UNIT/ML-% IJ SOLN: INTRAMUSCULAR | Status: DC | PRN

## 2017-03-23 MED ORDER — HEPARIN (PORCINE) IN NACL 2-0.9 UNIT/ML-% IJ SOLN
INTRAMUSCULAR | Status: AC
  Filled 2017-03-23: qty 1000

## 2017-03-23 MED ORDER — ASPIRIN EC 81 MG PO TBEC
81.0000 mg | DELAYED_RELEASE_TABLET | Freq: Every day | ORAL | Status: DC
Start: 2017-03-24 — End: 2017-03-24
  Administered 2017-03-24: 81 mg via ORAL
  Filled 2017-03-23: qty 1

## 2017-03-23 MED ORDER — MIDAZOLAM HCL 2 MG/2ML IJ SOLN
INTRAMUSCULAR | Status: DC | PRN
  Administered 2017-03-23: 2 mg via INTRAVENOUS

## 2017-03-23 MED ORDER — VERAPAMIL HCL 2.5 MG/ML IV SOLN
INTRAVENOUS | Status: AC
  Filled 2017-03-23: qty 2

## 2017-03-23 MED ORDER — SODIUM CHLORIDE 0.9% FLUSH: 3.0000 mL | INTRAVENOUS | Status: DC | PRN

## 2017-03-23 MED ORDER — METOPROLOL SUCCINATE ER 25 MG PO TB24
25.0000 mg | ORAL_TABLET | Freq: Every day | ORAL | Status: DC
  Administered 2017-03-23 – 2017-03-24 (×2): 25 mg via ORAL
  Filled 2017-03-23 (×2): qty 1

## 2017-03-23 MED ORDER — ROSUVASTATIN CALCIUM 40 MG PO TABS
40.0000 mg | ORAL_TABLET | Freq: Every day | ORAL | Status: DC
  Administered 2017-03-23: 40 mg via ORAL
  Filled 2017-03-23: qty 1

## 2017-03-23 MED ORDER — ASPIRIN 81 MG PO CHEW
81.0000 mg | CHEWABLE_TABLET | ORAL | Status: AC
  Administered 2017-03-23: 81 mg via ORAL

## 2017-03-23 MED ORDER — LIDOCAINE HCL (PF) 1 % IJ SOLN
INTRAMUSCULAR | Status: AC
  Filled 2017-03-23: qty 30

## 2017-03-23 MED ORDER — FENTANYL CITRATE (PF) 100 MCG/2ML IJ SOLN
INTRAMUSCULAR | Status: AC
  Filled 2017-03-23: qty 2

## 2017-03-23 MED ORDER — LOSARTAN POTASSIUM 25 MG PO TABS
25.0000 mg | ORAL_TABLET | Freq: Every day | ORAL | Status: DC
  Administered 2017-03-23 – 2017-03-24 (×2): 25 mg via ORAL
  Filled 2017-03-23 (×2): qty 1

## 2017-03-23 MED ORDER — ACETAMINOPHEN 325 MG PO TABS
650.0000 mg | ORAL_TABLET | ORAL | Status: DC | PRN
  Administered 2017-03-24: 650 mg via ORAL
  Filled 2017-03-23: qty 2

## 2017-03-23 MED ORDER — SODIUM CHLORIDE 0.9 % WEIGHT BASED INFUSION: 1.0000 mL/kg/h | INTRAVENOUS | Status: DC

## 2017-03-23 MED ORDER — HEPARIN SODIUM (PORCINE) 1000 UNIT/ML IJ SOLN
INTRAMUSCULAR | Status: AC
  Filled 2017-03-23: qty 1

## 2017-03-23 MED ORDER — SODIUM CHLORIDE 0.9 % WEIGHT BASED INFUSION: 1.0000 mL/kg/h | INTRAVENOUS | Status: AC

## 2017-03-23 MED ORDER — SODIUM CHLORIDE 0.9% FLUSH
3.0000 mL | Freq: Two times a day (BID) | INTRAVENOUS | Status: DC
  Administered 2017-03-23: 3 mL via INTRAVENOUS

## 2017-03-23 MED ORDER — LIDOCAINE HCL (PF) 1 % IJ SOLN
INTRAMUSCULAR | Status: DC | PRN
  Administered 2017-03-23: 2 mL

## 2017-03-23 MED ORDER — FENTANYL CITRATE (PF) 100 MCG/2ML IJ SOLN
INTRAMUSCULAR | Status: DC | PRN
  Administered 2017-03-23: 25 ug via INTRAVENOUS

## 2017-03-23 MED ORDER — SODIUM CHLORIDE 0.9% FLUSH
3.0000 mL | Freq: Two times a day (BID) | INTRAVENOUS | Status: DC
  Administered 2017-03-24: 3 mL via INTRAVENOUS

## 2017-03-23 MED ORDER — INSULIN ASPART PROT & ASPART (70-30 MIX) 100 UNIT/ML ~~LOC~~ SUSP
12.0000 [IU] | Freq: Two times a day (BID) | SUBCUTANEOUS | Status: DC
  Administered 2017-03-24: 12 [IU] via SUBCUTANEOUS
  Filled 2017-03-23: qty 10

## 2017-03-23 MED ORDER — IOPAMIDOL (ISOVUE-370) INJECTION 76%
INTRAVENOUS | Status: AC
  Filled 2017-03-23: qty 100

## 2017-03-23 MED ORDER — HEPARIN SODIUM (PORCINE) 1000 UNIT/ML IJ SOLN
INTRAMUSCULAR | Status: DC | PRN
  Administered 2017-03-23: 4000 [IU] via INTRAVENOUS

## 2017-03-23 MED ORDER — ADULT MULTIVITAMIN W/MINERALS CH
1.0000 | ORAL_TABLET | Freq: Every day | ORAL | Status: DC
  Administered 2017-03-23 – 2017-03-24 (×2): 1 via ORAL
  Filled 2017-03-23 (×3): qty 1

## 2017-03-23 MED ORDER — INSULIN ASPART PROT & ASPART (70-30 MIX) 100 UNIT/ML ~~LOC~~ SUSP
12.0000 [IU] | Freq: Two times a day (BID) | SUBCUTANEOUS | Status: DC
  Filled 2017-03-23: qty 10

## 2017-03-23 MED ORDER — SODIUM CHLORIDE 0.9 % WEIGHT BASED INFUSION
3.0000 mL/kg/h | INTRAVENOUS | Status: DC
  Administered 2017-03-23: 3 mL/kg/h via INTRAVENOUS

## 2017-03-23 MED ORDER — IOPAMIDOL (ISOVUE-370) INJECTION 76%
INTRAVENOUS | Status: DC | PRN
  Administered 2017-03-23: 65 mL

## 2017-03-23 MED ORDER — TICAGRELOR 90 MG PO TABS
90.0000 mg | ORAL_TABLET | Freq: Once | ORAL | Status: AC
  Administered 2017-03-23: 90 mg via ORAL

## 2017-03-23 MED ORDER — ONDANSETRON HCL 4 MG/2ML IJ SOLN: 4.0000 mg | Freq: Four times a day (QID) | INTRAMUSCULAR | Status: DC | PRN

## 2017-03-23 MED ORDER — LOSARTAN POTASSIUM 25 MG PO TABS: 25.0000 mg | ORAL_TABLET | Freq: Every day | ORAL | Status: DC

## 2017-03-23 MED ORDER — TICAGRELOR 90 MG PO TABS
ORAL_TABLET | ORAL | Status: AC
  Administered 2017-03-23: 90 mg via ORAL
  Filled 2017-03-23: qty 1

## 2017-03-23 MED ORDER — VERAPAMIL HCL 2.5 MG/ML IV SOLN
INTRAVENOUS | Status: DC | PRN
  Administered 2017-03-23: 10 mL via INTRA_ARTERIAL

## 2017-03-23 SURGICAL SUPPLY — 10 items
CATH 5FR JL3.5 JR4 ANG PIG MP (CATHETERS) ×2 IMPLANT
DEVICE RAD COMP TR BAND LRG (VASCULAR PRODUCTS) ×2 IMPLANT
GLIDESHEATH SLEND SS 6F .021 (SHEATH) ×4 IMPLANT
GUIDEWIRE INQWIRE 1.5J.035X260 (WIRE) ×1 IMPLANT
INQWIRE 1.5J .035X260CM (WIRE) ×2
KIT HEART LEFT (KITS) ×2 IMPLANT
PACK CARDIAC CATHETERIZATION (CUSTOM PROCEDURE TRAY) ×2 IMPLANT
SYR MEDRAD MARK V 150ML (SYRINGE) ×2 IMPLANT
TRANSDUCER W/STOPCOCK (MISCELLANEOUS) ×2 IMPLANT
TUBING CIL FLEX 10 FLL-RA (TUBING) ×2 IMPLANT

## 2017-03-23 NOTE — H&P (View-Only) (Signed)
Reason for Consult: CAD Referring Physician: Dr. Tally Joe  Kelly Hardy is an 56 y.o. male.  HPI: 56 yo man with a past history significant for coronary artery disease with stent to the RCA in 2017, type 1 diabetes without complication, hyperlipidemia, hypertension, and palpitations.  He was in his usual state of health until about 3 or 4 weeks ago when he noted exertional chest discomfort.  He says this is a pressure or tightness in his chest that seems to radiate out into his shoulders but does not go into his arms or in his neck.  He does feel short of breath when that happens.  It has always been relieved with rest, he has not had to take nitroglycerin.  He says he only experiences the pain with relatively heavy exertion but that he has noted progressing over the past 2-3 weeks.  He is on ticagrelor for his RCA stent.  He has not had any rest or nocturnal pain.  He does complain of some fatigue.  No syncope or presyncope. Past Medical History:  Diagnosis Date  . Coronary artery disease   . Diabetes mellitus without complication (Kelly Hardy)   . GERD (gastroesophageal reflux disease)   . Hyperlipidemia   . Hypertension   . Palpitations    eval 2014    Past Surgical History:  Procedure Laterality Date  . CARDIAC CATHETERIZATION N/A 02/10/2016   Procedure: Left Heart Cath and Coronary Angiography;  Surgeon: [name], MD;  Location: [place] [facility];  Service: Cardiovascular;  Laterality: N/A;  . CARDIAC CATHETERIZATION N/A 02/10/2016   Procedure: Coronary Stent Intervention;  Surgeon: [name], MD;  Location: [place] [facility];  Service: Cardiovascular;  Laterality: N/A;  . CORONARY STENT PLACEMENT  02/10/2016   STENT XIENCE ALPINE RX 3.25X15 drug eluting stent was successfully placed  . SALIVARY GLAND SURGERY Left    benign    Family History  Problem Relation Age of Onset  . Coronary artery disease Mother 29       Died age 64, CABG  . CAD Father 19       Died  age 34, CABG  . CAD Brother 55       Stents  . Heart failure Maternal Grandmother     Social History:  reports that  has never smoked. he has never used smokeless tobacco. He reports that he does not drink alcohol or use drugs.  Allergies: No Known Allergies  Medications:  Prior to Admission:  Medications Prior to Admission  Medication Sig Dispense Refill Last Dose  . aspirin EC 81 MG tablet Take 81 mg by mouth daily.   03/23/2017 at Unknown time  . insulin lispro protamine-lispro (HUMALOG 75/25 MIX) (75-25) 100 UNIT/ML SUSP injection Inject 12 Units into the skin 2 (two) times daily with a meal.   03/23/2017 at 0600  . metoprolol succinate (TOPROL-XL) 25 MG 24 hr tablet TAKE ONE TABLET BY MOUTH DAILY 90 tablet 3 03/22/2017 at Unknown time  . Multiple Vitamins-Minerals (MULTIVITAMIN WITH MINERALS) tablet Take 1 tablet by mouth daily.   03/22/2017 at Unknown time  . nitroGLYCERIN (NITROSTAT) 0.4 MG SL tablet Place 1 tablet (0.4 mg total) under the tongue every 5 (five) minutes as needed for chest pain. 25 tablet 12 PRN  . rosuvastatin (CRESTOR) 40 MG tablet TAKE ONE TABLET BY MOUTH AT BEDTIME. 30 tablet 11 03/22/2017 at Unknown time  . sildenafil (VIAGRA) 50 MG tablet Take 1 tablet (50 mg total) by mouth daily  as needed for erectile dysfunction. 15 tablet 0 03/22/2017 at Unknown time  . ticagrelor (BRILINTA) 90 MG TABS tablet Take 1 tablet (90 mg total) by mouth 2 (two) times daily. 180 tablet 3 03/22/2017 at Unknown time  . BAYER CONTOUR NEXT TEST test strip CHECK BLOOD SUGAR EIGHT TIMES DAILY  6 Taking  . losartan (COZAAR) 25 MG tablet Take 1 tablet (25 mg total) by mouth daily. 90 tablet 3   . NOVOFINE 32G X 6 MM MISC USE TO ADMINSTER INSULIN SIX TIMES DAILY.  4 Taking    Results for orders placed or performed during the hospital encounter of 03/23/17 (from the past 48 hour(s))  Basic metabolic panel     Status: Abnormal   Collection Time: 03/23/17  9:33 AM  Result Value Ref Range    Sodium 136 135 - 145 mmol/L   Potassium 3.6 3.5 - 5.1 mmol/L   Chloride 103 101 - 111 mmol/L   CO2 27 22 - 32 mmol/L   Glucose, Bld 219 (H) 65 - 99 mg/dL   BUN 11 6 - 20 mg/dL   Creatinine, Ser 0.91 0.61 - 1.24 mg/dL   Calcium 9.3 8.9 - 10.3 mg/dL   GFR calc non Af Amer >60 >60 mL/min   GFR calc Af Amer >60 >60 mL/min    Comment: (NOTE) The eGFR has been calculated using the CKD EPI equation. This calculation has not been validated in all clinical situations. eGFR's persistently <60 mL/min signify possible Chronic Kidney Disease.    Anion gap 6 5 - 15  CBC     Status: Abnormal   Collection Time: 03/23/17  9:33 AM  Result Value Ref Range   WBC 5.9 4.0 - 10.5 K/uL   RBC 4.29 4.22 - 5.81 MIL/uL   Hemoglobin 13.0 13.0 - 17.0 g/dL   HCT 38.7 (L) 39.0 - 52.0 %   MCV 90.2 78.0 - 100.0 fL   MCH 30.3 26.0 - 34.0 pg   MCHC 33.6 30.0 - 36.0 g/dL   RDW 12.8 11.5 - 15.5 %   Platelets 208 150 - 400 K/uL  Protime-INR     Status: None   Collection Time: 03/23/17  9:33 AM  Result Value Ref Range   Prothrombin Time 12.9 11.4 - 15.2 seconds   INR 0.98   Glucose, capillary     Status: Abnormal   Collection Time: 03/23/17  9:55 AM  Result Value Ref Range   Glucose-Capillary 215 (H) 65 - 99 mg/dL   Comment 1 Notify RN   Glucose, capillary     Status: Abnormal   Collection Time: 03/23/17  4:57 PM  Result Value Ref Range   Glucose-Capillary 128 (H) 65 - 99 mg/dL    No results found.  Review of Systems  Constitutional: Positive for malaise/fatigue. Negative for fever.  Eyes: Negative for blurred vision and double vision.  Respiratory: Negative for cough and sputum production.   Cardiovascular: Positive for chest pain and palpitations. Negative for orthopnea, claudication and leg swelling.  Gastrointestinal: Negative for heartburn, nausea and vomiting.  Genitourinary: Negative for dysuria and urgency.  Musculoskeletal: Negative for back pain and myalgias.  Neurological: Negative for  dizziness, focal weakness and loss of consciousness.  All other systems reviewed and are negative.  Blood pressure (!) 123/55, pulse 63, temperature 98.7 F (37.1 C), temperature source Oral, resp. rate 11, height '5\' 9"'$  (1.753 m), weight 145 lb (65.8 kg), SpO2 97 %. Physical Exam  Vitals reviewed. Constitutional: He is oriented to  person, place, and time. He appears well-developed and well-nourished. No distress.  HENT:  Head: Normocephalic and atraumatic.  Mouth/Throat: No oropharyngeal exudate.  Eyes: Conjunctivae and EOM are normal. No scleral icterus.  Neck: Neck supple. No thyromegaly present.  Cardiovascular: Normal rate, regular rhythm, normal heart sounds and intact distal pulses. Exam reveals no gallop and no friction rub.  No murmur heard. Respiratory: Effort normal and breath sounds normal. No respiratory distress. He has no wheezes. He has no rales.  GI: Soft. Bowel sounds are normal. He exhibits no distension. There is no tenderness.  Musculoskeletal: He exhibits no edema or deformity.  Lymphadenopathy:    He has no cervical adenopathy.  Neurological: He is alert and oriented to person, place, and time. No cranial nerve deficit. He exhibits normal muscle tone.  Skin: Skin is warm and dry.   Cardiac catheterization Conclusion     Previously placed Mid RCA stent (unknown type) is widely patent.  Dist LM to Ost LAD lesion is 90% stenosed.  Prox LAD to Mid LAD lesion is 45% stenosed.  The left ventricular systolic function is normal.  LV end diastolic pressure is normal.  The left ventricular ejection fraction is 55-65% by visual estimate.   1. Single vessel obstructive CAD with 90% ostial LAD stenosis. 2. Patent RCA stent 3. Normal LV function 4. Normal LVEDP  Plan: the ostial LAD stenosis is not favorable for stenting. It is calcified and in order to stent it the stent would need to extend back into the left main potentially jeopardizing a very large ramus  branch and the LCx. I think a more favorable option would be to have a LIMA graft placed to the LAD. He will need to stop Brilinta and let this wash out. We will observe overnight and obtain a CT surgical consultation. Patient could potentially go home for Brilinta washout but would favor surgery expeditiously.     I personally reviewed the catheterization images and concur with the findings noted above.  Assessment/Plan: Mr. Jian is a 56 year old man with multiple cardiac risk factors including type 1 diabetes, hypertension, hyperlipidemia, and a strong family history of coronary disease.  He has known coronary disease dating back to 2017 when he had a stent placed in his right coronary.  He now presents with recurrent exertional angina.  Catheterization his RCA stent is patent, but he has a tight proximal LAD lesion not amenable to percutaneous intervention.  Coronary artery bypass grafting is indicated for relief of symptoms and survival benefit.  I discussed the general nature of the procedure, the need for general anesthesia, the use of cardiopulmonary bypass and the incisions to be used with Mr. Mrs. Debes. We discussed the expected hospital stay, overall recovery and short and long term outcomes.  I informed him of the indications, risks, benefits, and alternatives.  They understand the risks include but are not limited to death, stroke, MI, DVT/PE, bleeding, possible need for transfusion, infections, cardiac arrhythmias, as well as other organ system dysfunction including respiratory, renal, or GI complications.   He accepts the risks and agrees to proceed.  He is on Brilinta needs to be off of that for 5 days prior to surgery to decrease his risk for bleeding.  I offered him the option of having 1 of my partners do the case late next week or I would be able to do the procedure for him on Wednesday, January 2.  He prefers to wait until after the holidays and do it on  January 2.  He knows to  avoid exertion to the extent that it causes chest pain.  He knows to use nitroglycerin.  He knows when to call if his pain does not resolve.  Melrose Nakayama 03/23/2017, 6:06 PM

## 2017-03-23 NOTE — Interval H&P Note (Signed)
History and Physical Interval Note:  03/23/2017 2:53 PM  Kelly Hardy  has presented today for surgery, with the diagnosis of cp  The various methods of treatment have been discussed with the patient and family. After consideration of risks, benefits and other options for treatment, the patient has consented to  Procedure(s): LEFT HEART CATH AND CORONARY ANGIOGRAPHY (N/A) as a surgical intervention .  The patient's history has been reviewed, patient examined, no change in status, stable for surgery.  I have reviewed the patient's chart and labs.  Questions were answered to the patient's satisfaction.    Cath Lab Visit (complete for each Cath Lab visit)  Clinical Evaluation Leading to the Procedure:   ACS: No.  Non-ACS:    Anginal Classification: CCS III  Anti-ischemic medical therapy: Minimal Therapy (1 class of medications)  Non-Invasive Test Results: No non-invasive testing performed  Prior CABG: No previous CABG       Kelly Hardy [facility] 03/23/2017 2:53 PM

## 2017-03-23 NOTE — Progress Notes (Signed)
TR BAND REMOVAL  LOCATION:right    radial  DEFLATED PER PROTOCOL:   yes  TIME BAND OFF / DRESSING APPLIED: 1720     SITE UPON ARRIVAL:    Level 0  SITE AFTER BAND REMOVAL:    Level 0  CIRCULATION SENSATION AND MOVEMENT:    Within Normal Limits : yes  COMMENTS:

## 2017-03-23 NOTE — Progress Notes (Signed)
1800 Pt arrived via stretcher to room 3E23 with spouse, Quita Skye. Pt A&Ox4, stood up from stretcher and walked to bed. Skin checked with Weston Anna, RN. Right wrist cath site is CDI, level 0, bruising noted proximal to puncture site from discontinued IV. Pt oriented to room and unit, updated with POC and upcoming tests/procedures. Pt assessed, see flowsheet. RN ordered pt diet. Pt denies pain, SOB, CP. Fall precautions in place, [facility].

## 2017-03-23 NOTE — Consult Note (Signed)
Reason for Consult: CAD Referring Physician: Dr. Tally Joe  Kelly Hardy is an 56 y.o. male.  HPI: 56 yo man with a past history significant for coronary artery disease with stent to the RCA in 2017, type 1 diabetes without complication, hyperlipidemia, hypertension, and palpitations.  He was in his usual state of health until about 3 or 4 weeks ago when he noted exertional chest discomfort.  He says this is a pressure or tightness in his chest that seems to radiate out into his shoulders but does not go into his arms or in his neck.  He does feel short of breath when that happens.  It has always been relieved with rest, he has not had to take nitroglycerin.  He says he only experiences the pain with relatively heavy exertion but that he has noted progressing over the past 2-3 weeks.  He is on ticagrelor for his RCA stent.  He has not had any rest or nocturnal pain.  He does complain of some fatigue.  No syncope or presyncope. Past Medical History:  Diagnosis Date  . Coronary artery disease   . Diabetes mellitus without complication ([place])   . GERD (gastroesophageal reflux disease)   . Hyperlipidemia   . Hypertension   . Palpitations    eval 2014    Past Surgical History:  Procedure Laterality Date  . CARDIAC CATHETERIZATION N/A 02/10/2016   Procedure: Left Heart Cath and Coronary Angiography;  Surgeon: [name], MD;  Location: [facility];  Service: Cardiovascular;  Laterality: N/A;  . CARDIAC CATHETERIZATION N/A 02/10/2016   Procedure: Coronary Stent Intervention;  Surgeon: [name], MD;  Location: [facility];  Service: Cardiovascular;  Laterality: N/A;  . CORONARY STENT PLACEMENT  02/10/2016   STENT XIENCE ALPINE RX 3.25X15 drug eluting stent was successfully placed  . SALIVARY GLAND SURGERY Left    benign    Family History  Problem Relation Age of Onset  . Coronary artery disease Mother 37       Died age 7, CABG  . CAD Father 86       Died  age 51, CABG  . CAD Brother 40       Stents  . Heart failure Maternal Grandmother     Social History:  reports that  has never smoked. he has never used smokeless tobacco. He reports that he does not drink alcohol or use drugs.  Allergies: No Known Allergies  Medications:  Prior to Admission:  Medications Prior to Admission  Medication Sig Dispense Refill Last Dose  . aspirin EC 81 MG tablet Take 81 mg by mouth daily.   03/23/2017 at Unknown time  . insulin lispro protamine-lispro (HUMALOG 75/25 MIX) (75-25) 100 UNIT/ML SUSP injection Inject 12 Units into the skin 2 (two) times daily with a meal.   03/23/2017 at 0600  . metoprolol succinate (TOPROL-XL) 25 MG 24 hr tablet TAKE ONE TABLET BY MOUTH DAILY 90 tablet 3 03/22/2017 at Unknown time  . Multiple Vitamins-Minerals (MULTIVITAMIN WITH MINERALS) tablet Take 1 tablet by mouth daily.   03/22/2017 at Unknown time  . nitroGLYCERIN (NITROSTAT) 0.4 MG SL tablet Place 1 tablet (0.4 mg total) under the tongue every 5 (five) minutes as needed for chest pain. 25 tablet 12 PRN  . rosuvastatin (CRESTOR) 40 MG tablet TAKE ONE TABLET BY MOUTH AT BEDTIME. 30 tablet 11 03/22/2017 at Unknown time  . sildenafil (VIAGRA) 50 MG tablet Take 1 tablet (50 mg total) by mouth daily  as needed for erectile dysfunction. 15 tablet 0 03/22/2017 at Unknown time  . ticagrelor (BRILINTA) 90 MG TABS tablet Take 1 tablet (90 mg total) by mouth 2 (two) times daily. 180 tablet 3 03/22/2017 at Unknown time  . BAYER CONTOUR NEXT TEST test strip CHECK BLOOD SUGAR EIGHT TIMES DAILY  6 Taking  . losartan (COZAAR) 25 MG tablet Take 1 tablet (25 mg total) by mouth daily. 90 tablet 3   . NOVOFINE 32G X 6 MM MISC USE TO ADMINSTER INSULIN SIX TIMES DAILY.  4 Taking    Results for orders placed or performed during the hospital encounter of 03/23/17 (from the past 48 hour(s))  Basic metabolic panel     Status: Abnormal   Collection Time: 03/23/17  9:33 AM  Result Value Ref Range    Sodium 136 135 - 145 mmol/L   Potassium 3.6 3.5 - 5.1 mmol/L   Chloride 103 101 - 111 mmol/L   CO2 27 22 - 32 mmol/L   Glucose, Bld 219 (H) 65 - 99 mg/dL   BUN 11 6 - 20 mg/dL   Creatinine, Ser 0.91 0.61 - 1.24 mg/dL   Calcium 9.3 8.9 - 10.3 mg/dL   GFR calc non Af Amer >60 >60 mL/min   GFR calc Af Amer >60 >60 mL/min    Comment: (NOTE) The eGFR has been calculated using the CKD EPI equation. This calculation has not been validated in all clinical situations. eGFR's persistently <60 mL/min signify possible Chronic Kidney Disease.    Anion gap 6 5 - 15  CBC     Status: Abnormal   Collection Time: 03/23/17  9:33 AM  Result Value Ref Range   WBC 5.9 4.0 - 10.5 K/uL   RBC 4.29 4.22 - 5.81 MIL/uL   Hemoglobin 13.0 13.0 - 17.0 g/dL   HCT 38.7 (L) 39.0 - 52.0 %   MCV 90.2 78.0 - 100.0 fL   MCH 30.3 26.0 - 34.0 pg   MCHC 33.6 30.0 - 36.0 g/dL   RDW 12.8 11.5 - 15.5 %   Platelets 208 150 - 400 K/uL  Protime-INR     Status: None   Collection Time: 03/23/17  9:33 AM  Result Value Ref Range   Prothrombin Time 12.9 11.4 - 15.2 seconds   INR 0.98   Glucose, capillary     Status: Abnormal   Collection Time: 03/23/17  9:55 AM  Result Value Ref Range   Glucose-Capillary 215 (H) 65 - 99 mg/dL   Comment 1 Notify RN   Glucose, capillary     Status: Abnormal   Collection Time: 03/23/17  4:57 PM  Result Value Ref Range   Glucose-Capillary 128 (H) 65 - 99 mg/dL    No results found.  Review of Systems  Constitutional: Positive for malaise/fatigue. Negative for fever.  Eyes: Negative for blurred vision and double vision.  Respiratory: Negative for cough and sputum production.   Cardiovascular: Positive for chest pain and palpitations. Negative for orthopnea, claudication and leg swelling.  Gastrointestinal: Negative for heartburn, nausea and vomiting.  Genitourinary: Negative for dysuria and urgency.  Musculoskeletal: Negative for back pain and myalgias.  Neurological: Negative for  dizziness, focal weakness and loss of consciousness.  All other systems reviewed and are negative.  Blood pressure (!) 123/55, pulse 63, temperature 98.7 F (37.1 C), temperature source Oral, resp. rate 11, height '5\' 9"'$  (1.753 m), weight 145 lb (65.8 kg), SpO2 97 %. Physical Exam  Vitals reviewed. Constitutional: He is oriented to  person, place, and time. He appears well-developed and well-nourished. No distress.  HENT:  Head: Normocephalic and atraumatic.  Mouth/Throat: No oropharyngeal exudate.  Eyes: Conjunctivae and EOM are normal. No scleral icterus.  Neck: Neck supple. No thyromegaly present.  Cardiovascular: Normal rate, regular rhythm, normal heart sounds and intact distal pulses. Exam reveals no gallop and no friction rub.  No murmur heard. Respiratory: Effort normal and breath sounds normal. No respiratory distress. He has no wheezes. He has no rales.  GI: Soft. Bowel sounds are normal. He exhibits no distension. There is no tenderness.  Musculoskeletal: He exhibits no edema or deformity.  Lymphadenopathy:    He has no cervical adenopathy.  Neurological: He is alert and oriented to person, place, and time. No cranial nerve deficit. He exhibits normal muscle tone.  Skin: Skin is warm and dry.   Cardiac catheterization Conclusion     Previously placed Mid RCA stent (unknown type) is widely patent.  Dist LM to Ost LAD lesion is 90% stenosed.  Prox LAD to Mid LAD lesion is 45% stenosed.  The left ventricular systolic function is normal.  LV end diastolic pressure is normal.  The left ventricular ejection fraction is 55-65% by visual estimate.   1. Single vessel obstructive CAD with 90% ostial LAD stenosis. 2. Patent RCA stent 3. Normal LV function 4. Normal LVEDP  Plan: the ostial LAD stenosis is not favorable for stenting. It is calcified and in order to stent it the stent would need to extend back into the left main potentially jeopardizing a very large ramus  branch and the LCx. I think a more favorable option would be to have a LIMA graft placed to the LAD. He will need to stop Brilinta and let this wash out. We will observe overnight and obtain a CT surgical consultation. Patient could potentially go home for Brilinta washout but would favor surgery expeditiously.     I personally reviewed the catheterization images and concur with the findings noted above.  Assessment/Plan: Mr. Borin is a 56 year old man with multiple cardiac risk factors including type 1 diabetes, hypertension, hyperlipidemia, and a strong family history of coronary disease.  He has known coronary disease dating back to 2017 when he had a stent placed in his right coronary.  He now presents with recurrent exertional angina.  Catheterization his RCA stent is patent, but he has a tight proximal LAD lesion not amenable to percutaneous intervention.  Coronary artery bypass grafting is indicated for relief of symptoms and survival benefit.  I discussed the general nature of the procedure, the need for general anesthesia, the use of cardiopulmonary bypass and the incisions to be used with Mr. Mrs. Koegel. We discussed the expected hospital stay, overall recovery and short and long term outcomes.  I informed him of the indications, risks, benefits, and alternatives.  They understand the risks include but are not limited to death, stroke, MI, DVT/PE, bleeding, possible need for transfusion, infections, cardiac arrhythmias, as well as other organ system dysfunction including respiratory, renal, or GI complications.   He accepts the risks and agrees to proceed.  He is on Brilinta needs to be off of that for 5 days prior to surgery to decrease his risk for bleeding.  I offered him the option of having 1 of my partners do the case late next week or I would be able to do the procedure for him on Wednesday, January 2.  He prefers to wait until after the holidays and do it on  January 2.  He knows to  avoid exertion to the extent that it causes chest pain.  He knows to use nitroglycerin.  He knows when to call if his pain does not resolve.  Melrose Nakayama 03/23/2017, 6:06 PM

## 2017-03-24 DIAGNOSIS — I200 Unstable angina: Secondary | ICD-10-CM

## 2017-03-24 DIAGNOSIS — Z9861 Coronary angioplasty status: Secondary | ICD-10-CM

## 2017-03-24 DIAGNOSIS — I2510 Atherosclerotic heart disease of native coronary artery without angina pectoris: Secondary | ICD-10-CM | POA: Diagnosis not present

## 2017-03-24 DIAGNOSIS — E7800 Pure hypercholesterolemia, unspecified: Secondary | ICD-10-CM | POA: Diagnosis not present

## 2017-03-24 DIAGNOSIS — I10 Essential (primary) hypertension: Secondary | ICD-10-CM

## 2017-03-24 DIAGNOSIS — I25110 Atherosclerotic heart disease of native coronary artery with unstable angina pectoris: Secondary | ICD-10-CM | POA: Diagnosis not present

## 2017-03-24 LAB — GLUCOSE, CAPILLARY: GLUCOSE-CAPILLARY: 182 mg/dL — AB (ref 65–99)

## 2017-03-24 MED ORDER — LOSARTAN POTASSIUM 25 MG PO TABS: 25.0000 mg | ORAL_TABLET | Freq: Every day | ORAL | 3 refills | Status: DC

## 2017-03-24 NOTE — Progress Notes (Signed)
Pt is been discharge home with wife, discharge teachings and instructions reviewed with pt and wife, denies concern, denies pain, vitals are stable. D/C of tele monitor, IV out.

## 2017-03-24 NOTE — Discharge Instructions (Signed)

## 2017-03-24 NOTE — Discharge Summary (Signed)
Discharge Summary    Patient ID: Kelly Hardy,  MRN: [id number], DOB/AGE: 10-06-1960 56 y.o.  Admit date: 03/23/2017 Discharge date: 03/24/2017  Primary Care Provider: [name] Primary Cardiologist: Dr Percival Spanish  Discharge Diagnoses    Active Problems:   Chest pain   Exertional angina [facility])   CAD S/P percutaneous coronary angioplasty   Hypertension   Hyperlipidemia   Diabetes mellitus type 2, insulin dependent ([place])   Unstable angina ([facility])   Allergies No Known Allergies  Diagnostic Studies/Procedures    Cardiac catheterization 03/23/17: 1. Single vessel obstructive CAD with 90% ostial LAD stenosis. 2. Patent RCA stent 3. Normal LV function 4. Normal LVEDP Plan: the ostial LAD stenosis is not favorable for stenting. It is calcified and in order to stent it the stent would need to extend back into the left main potentially jeopardizing a very large ramus branch and the LCx. I think a more favorable option would be to have a LIMA graft placed to the LAD. He will need to stop Brilinta and let this wash out. We will observe overnight and obtain a CT surgical consultation. Patient could potentially go home for Brilinta washout but would favor surgery expeditiously.    _____________   History of Present Illness     56 y/o Caucasian male with complaints of exertional chest pain 02/01/16. OP stress Myoview done 02/09/16 showed diaphragmatic attenuation with 22mm inferior ST depression and chest pain. He was admitted the next day to [facility] for OP cath which revealed a 95% mRCA. He recieved a DES with good results.   12/21 note: Since I last saw him he has developed new onset exertional chest discomfort.  This is just like his previous angina.  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   He notices that carrying something he will develop mid discomfort that can be 7 out of 10 in intensity.  He stops and it goes away.   He had some mild nausea.  It radiates across his chest but not his arms or into his neck.  He has not had any resting discomfort.  Is not actually taken any nitroglycerin.  He has had a little fatigue after he is been working which is unusual for him.  He has not had any presyncope or syncope.  He said no PND or orthopnea.  He was admitted and scheduled for cath.    Hospital Course     Consultants: [facility]   Cardiac catheterization results are above.  He had an ostial 90% LAD lesion, unfavorable for stenting.  Surgical consult was called.  He was seen by Dr. Roxan Hockey and surgery was recommended.  This can be done as an outpatient since he is not having resting angina and needs a Brilinta washout period. He preferred to wait for surgery until after the holidays.  Ultimately, he needs to be off of the Brilinta 5 days prior to surgery to decrease risks of bleeding.  He can take his last dose of Brilinta on 12/27. Obviously if he has worsening symptoms, he is to return to the hospital.    Hyperlipidemia - LDL 61, HDL 42.  Continue with Crestor  Essential hypertension -Well-controlled.  No changes made.  Diabetes with hypertension -Hemoglobin A1c 9.0 up from 8.1.  Dr. Consuello Masse has been monitoring.  Consider Jardiance.  He has had aggressive progression of his coronary artery disease.  It is of utmost  importance to try to control his diabetes.  Because of the weekend and the holiday, we were not able to get PFTs and Doppler studies done as an inpatient. [facility] was contacted and they will arrange for the studies to be performed as an outpatient.  Dr. Marlou Porch evaluated Mr. Ericsson on 12/22.  He can be discharged home, to complete pre-bypass workup as an outpatient. _____________  Discharge Vitals Blood pressure (!) 122/58, pulse 68, temperature 98.7 F (37.1 C), temperature source Oral, resp. rate 18, height 5\' 9"  (1.753 m), weight 147 lb 12.8 oz (67 kg), SpO2 96 %.  Filed Weights   03/23/17  0929 03/23/17 1800 03/24/17 0518  Weight: 145 lb (65.8 kg) 145 lb 9.6 oz (66 kg) 147 lb 12.8 oz (67 kg)    Labs & Radiologic Studies    CBC Recent Labs    03/23/17 0933  WBC 5.9  HGB 13.0  HCT 38.7*  MCV 90.2  PLT 976   Basic Metabolic Panel Recent Labs    03/23/17 0933  NA 136  K 3.6  CL 103  CO2 27  GLUCOSE 219*  BUN 11  CREATININE 0.91  CALCIUM 9.3   _____________   Disposition   Pt is being discharged home today in stable condition.  Follow-up Plans & Appointments    Follow-up Information    [name], MD Follow up.   Specialty:  Cardiology Why:  The office will call. Contact information: [address] [place] [address] [phone]        Melrose Nakayama, MD Follow up.   Specialty:  Cardiothoracic Surgery Why:  They will call you to schedule the pulmonary function tests and ultrasound Dopplers ordered.  They must be completed prior to your bypass surgery. Contact information: [place] [address] [phone]          Discharge Instructions    Diet - low sodium heart healthy   Complete by:  As directed    Diet Carb Modified   Complete by:  As directed    Increase activity slowly   Complete by:  As directed       Discharge Medications   Allergies as of 03/24/2017   No Known Allergies     Medication List    TAKE these medications   aspirin EC 81 MG tablet Take 81 mg by mouth daily.   BAYER CONTOUR NEXT TEST test strip Generic drug:  glucose blood CHECK BLOOD SUGAR EIGHT TIMES DAILY   insulin lispro protamine-lispro (75-25) 100 UNIT/ML Susp injection Commonly known as:  HUMALOG 75/25 MIX Inject 12 Units into the skin 2 (two) times daily with a meal.   losartan 25 MG tablet Commonly known as:  COZAAR Take 1 tablet (25 mg total) by mouth daily.   metoprolol succinate 25 MG 24 hr tablet Commonly known as:  TOPROL-XL TAKE ONE TABLET BY MOUTH DAILY   multivitamin  with minerals tablet Take 1 tablet by mouth daily.   nitroGLYCERIN 0.4 MG SL tablet Commonly known as:  NITROSTAT Place 1 tablet (0.4 mg total) under the tongue every 5 (five) minutes as needed for chest pain.   NOVOFINE 32G X 6 MM Misc Generic drug:  Insulin Pen Needle USE TO ADMINSTER INSULIN SIX TIMES DAILY.   rosuvastatin 40 MG tablet Commonly known as:  CRESTOR TAKE ONE TABLET BY MOUTH AT BEDTIME.   sildenafil 50 MG tablet Commonly known as:  VIAGRA Take 1 tablet (50 mg total)  by mouth daily as needed for erectile dysfunction.   ticagrelor 90 MG Tabs tablet Commonly known as:  BRILINTA Take 1 tablet (90 mg total) by mouth 2 (two) times daily. Notes to patient:  STOP this medication 5 days before your bypass surgery.  12/27 if surgery is to be on January 2.         Outstanding Labs/Studies   Pre-CABG Dopplers and PFTs to be performed as an outpatient.  Surgical team is aware.  Duration of Discharge Encounter   Greater than 30 minutes including physician time.  Alcario Drought Barrett NP 03/24/2017, 4:25 PM  Personally seen and examined. Agree with above.  Patient Profile     56 y.o. male with severe coronary artery disease status post prior mid RCA drug-eluting stent placed last November here with unstable angina, repeat cardiac catheterization demonstrating ostial LAD disease, preferring bypass is ultimate treatment.  Assessment & Plan    Severe coronary artery disease/unstable angina/ostial LAD disease -Note Brilinta, awaiting surgery on January 2.  He could ultimately take his last dose on Thursday, December 27.  He preferred to wait for surgery until after the holidays.  Ultimately, he needs to be off of the Brilinta 5 days prior to surgery to decrease risks of bleeding.  Dr. Roxan Hockey.  I am comfortable with discharge home after preop workup studies are performed.  Obviously if he has worsening symptoms, he is to return to the hospital.  Reviewed Dr.  Leonarda Salon notes.  Hyperlipidemia - LDL 61, HDL 42.  Continue with Crestor  Essential hypertension -Well-controlled.  No changes made.  Diabetes with hypertension -Hemoglobin A1c 9.0 up from 8.1.  Dr. Consuello Masse has been monitoring.  Consider Jardiance.  He has had aggressive progression of his coronary artery disease.  It is of utmost importance to try to control his diabetes.  OK to DC.   Candee Furbish, MD

## 2017-03-24 NOTE — Progress Notes (Signed)
Progress Note  Patient Name: Kelly Hardy Date of Encounter: 03/24/2017  Primary Cardiologist: No primary care provider on file.   Subjective   Overall feeling well, no further chest discomfort, no shortness of breath.  Inpatient Medications    Scheduled Meds: . aspirin EC  81 mg Oral Daily  . insulin aspart protamine- aspart  12 Units Subcutaneous BID WC  . losartan  25 mg Oral Daily  . metoprolol succinate  25 mg Oral Daily  . multivitamin with minerals  1 tablet Oral Daily  . rosuvastatin  40 mg Oral QHS  . sodium chloride flush  3 mL Intravenous Q12H   Continuous Infusions: . sodium chloride     PRN Meds: sodium chloride, acetaminophen, nitroGLYCERIN, ondansetron (ZOFRAN) IV, sodium chloride flush   Vital Signs    Vitals:   03/23/17 1930 03/24/17 0004 03/24/17 0518 03/24/17 0835  BP: (!) 124/58 (!) 119/54 105/62 (!) 122/58  Pulse: 64 (!) 54 62 72  Resp: 16 18 18    Temp: 98.3 F (36.8 C) 97.7 F (36.5 C) 98 F (36.7 C)   TempSrc: Oral Oral Oral   SpO2: 100% 98% 97% 96%  Weight:   147 lb 12.8 oz (67 kg)   Height:        Intake/Output Summary (Last 24 hours) at 03/24/2017 1036 Last data filed at 03/23/2017 2209 Gross per 24 hour  Intake 386.95 ml  Output -  Net 386.95 ml   Filed Weights   03/23/17 0929 03/23/17 1800 03/24/17 0518  Weight: 145 lb (65.8 kg) 145 lb 9.6 oz (66 kg) 147 lb 12.8 oz (67 kg)    Telemetry    Normal sinus rhythm, no adverse arrhythmias- Personally Reviewed  ECG    No new EKG- Personally Reviewed  Physical Exam   GEN: No acute distress.   Neck: No JVD Cardiac: RRR, no murmurs, rubs, or gallops.  Cardiac catheterization site clean dry and intact. Respiratory: Clear to auscultation bilaterally. GI: Soft, nontender, non-distended  MS: No edema; No deformity. Neuro:  Nonfocal  Psych: Normal affect   Labs    Chemistry Recent Labs  Lab 03/23/17 0933  NA 136  K 3.6  CL 103  CO2 27  GLUCOSE 219*  BUN 11    CREATININE 0.91  CALCIUM 9.3  GFRNONAA >60  GFRAA >60  ANIONGAP 6     Hematology Recent Labs  Lab 03/23/17 0933  WBC 5.9  RBC 4.29  HGB 13.0  HCT 38.7*  MCV 90.2  MCH 30.3  MCHC 33.6  RDW 12.8  PLT 208    Cardiac EnzymesNo results for input(s): TROPONINI in the last 168 hours. No results for input(s): TROPIPOC in the last 168 hours.   BNPNo results for input(s): BNP, PROBNP in the last 168 hours.   DDimer No results for input(s): DDIMER in the last 168 hours.   Radiology    No results found.  Cardiac Studies   Cardiac catheterization 03/23/17: 1. Single vessel obstructive CAD with 90% ostial LAD stenosis. 2. Patent RCA stent 3. Normal LV function 4. Normal LVEDP  Plan: the ostial LAD stenosis is not favorable for stenting. It is calcified and in order to stent it the stent would need to extend back into the left main potentially jeopardizing a very large ramus branch and the LCx. I think a more favorable option would be to have a LIMA graft placed to the LAD. He will need to stop Brilinta and let this wash out. We will  observe overnight and obtain a CT surgical consultation. Patient could potentially go home for Brilinta washout but would favor surgery expeditiously.   Patient Profile     56 y.o. male with severe coronary artery disease status post prior mid RCA drug-eluting stent placed last November here with unstable angina, repeat cardiac catheterization demonstrating ostial LAD disease, preferring bypass is ultimate treatment.  Assessment & Plan    Severe coronary artery disease/unstable angina/ostial LAD disease -Note Brilinta, awaiting surgery on January 2.  He could ultimately take his last dose on Thursday, December 27.  He preferred to wait for surgery until after the holidays.  Ultimately, he needs to be off of the Brilinta 5 days prior to surgery to decrease risks of bleeding.  Dr. Roxan Hockey.  I am comfortable with discharge home after preop workup  studies are performed.  Obviously if he has worsening symptoms, he is to return to the hospital.  Reviewed Dr. Leonarda Salon notes.  Hyperlipidemia - LDL 61, HDL 42.  Continue with Crestor  Essential hypertension -Well-controlled.  No changes made.  Diabetes with hypertension -Hemoglobin A1c 9.0 up from 8.1.  Dr. Consuello Masse has been monitoring.  Consider Jardiance.  He has had aggressive progression of his coronary artery disease.  It is of utmost importance to try to control his diabetes.  After preoperative vascular and pulmonary studies are performed, he may be discharged.  For questions or updates, please contact [place] Please consult [email or web address] for contact info under Cardiology/STEMI.      Signed, Candee Furbish, MD  03/24/2017, 10:36 AM

## 2017-03-24 NOTE — Progress Notes (Signed)
Pt complain of pain and swolleness in R arm where cath was done 12/21, on assessment there is slight edema to R hand and arm, no bleeding at this time, ice pack applied, pain meds given for comfort and hand is elavated on pillow. Vitals are stable. Will continue to monitor

## 2017-03-26 ENCOUNTER — Other Ambulatory Visit: Payer: Self-pay

## 2017-03-26 ENCOUNTER — Encounter (HOSPITAL_COMMUNITY): Payer: Self-pay | Admitting: Cardiology

## 2017-03-26 DIAGNOSIS — I2510 Atherosclerotic heart disease of native coronary artery without angina pectoris: Secondary | ICD-10-CM

## 2017-03-30 NOTE — Progress Notes (Addendum)
Kelly Hardy            03/30/2017                          [facility] - [address], [place], [place] - [address] [address] Phone: [phone] Fax: [phone]              Your procedure is scheduled on  Wednesday 04/04/17            Report to [facility] Admitting at 630 A.M.            Call this number if you have problems the morning of surgery:            [phone]             Remember:            Do not eat food or drink liquids after midnight.            Take these medicines the morning of surgery with A SIP OF WATER  METOPROLOL (TOPROL)  7 days prior to surgery STOP taking any Aspirin(unless otherwise instructed by your surgeon), Aleve, Naproxen, Ibuprofen, Motrin, Advil, Goody's, BC's, all herbal medications, fish oil, and all vitamins  HOW TO MANAGE YOUR DIABETES BEFORE AND AFTER SURGERY  Why is it important to control my blood sugar before and after surgery?  Improving blood sugar levels before and after surgery helps healing and can limit problems.  A way of improving blood sugar control is eating a healthy diet by: ?  Eating less sugar and carbohydrates ?  Increasing activity/exercise ?  Talking with your doctor about reaching your blood sugar goals  High blood sugars (greater than 180 mg/dL) can raise your risk of infections and slow your recovery, so you will need to focus on controlling your diabetes during the weeks before surgery.  Make sure that the doctor who takes care of your diabetes knows about your planned surgery including the date and location.  How do I manage my blood sugar before surgery?  Check your blood sugar at least 4 times a day, starting 2 days before surgery, to make sure that the level is not too high or low. ? Check your blood sugar the morning of your surgery when you wake up and every 2 hours until you get to the Short Stay unit.  If your blood sugar is less than 70 mg/dL, you will  need to treat for low blood sugar: ? Do not take insulin. ? Treat a low blood sugar (less than 70 mg/dL) with  cup of clear juice (cranberry or apple), 4glucose tablets, OR glucose gel. Recheck blood sugar in 15 minutes after treatment (to make sure it is greater than 70 mg/dL). If your blood sugar is not greater than 70 mg/dL on recheck, call [phone] ?  for further instructions.  Report your blood sugar to the short stay nurse when you get to Short Stay.   If you are admitted to the hospital after surgery: ? Your blood sugar will be checked by the staff and you will probably be given insulin after surgery (instead of oral diabetes medicines) to make sure you have good blood sugar levels. ? The goal for blood sugar control after surgery is 80-180 mg/dL.  WHAT DO I DO ABOUT MY DIABETES MEDICATION?   THE NIGHT BEFORE SURGERY, take _____8______ units of ____Humalog 75/25_______insulin.  THE MORNING OF SURGERY, take _______6______ units of Humalog 75/25  insulin.   If your CBG is greater than 220 mg/dL, you may take  of your sliding scale (correction) dose of insulin.             Do not wear jewelry.            Do not wear lotions, powders, colognes, or deodorant.            Do not shave 48 hours prior to surgery.  Men may shave face and neck.            Do not bring valuables to the hospital.            [facility] is not responsible for any belongings or valuables.  Contacts, dentures or bridgework may not be worn into surgery.  Leave your suitcase in the car.  After surgery it may be brought to your room.  For patients admitted to the hospital, discharge time will be determined by your treatment team.  Patients discharged the day of surgery will not be allowed to drive home.   Name and phone number of your driver:    Special instructions:  [place] - Preparing for Surgery  Before surgery, you can play an important role.   Because skin is not sterile, your skin needs to be as free of germs as possible.  You can reduce the number of germs on you skin by washing with CHG (chlorahexidine gluconate) soap before surgery.  CHG is an antiseptic cleaner which kills germs and bonds with the skin to continue killing germs even after washing.  Please DO NOT use if you have an allergy to CHG or antibacterial soaps.  If your skin becomes reddened/irritated stop using the CHG and inform your nurse when you arrive at Short Stay.  Do not shave (including legs and underarms) for at least 48 hours prior to the first CHG shower.  You may shave your face.  Please follow these instructions carefully:             1.  Shower with CHG Soap the night before surgery and the                                       morning of Surgery.            2.  If you choose to wash your hair, wash your hair first as usual with your                normal shampoo.            3.  After you shampoo, rinse your hair and body thoroughly to remove the                             Shampoo.            4.  Use CHG as you would any other liquid soap.  You can apply chg directly               to the skin and wash gently with scrungie or a clean washcloth.            5.  Apply the CHG Soap to your body ONLY FROM THE NECK DOWN.          Do  not use on open wounds or open sores.  Avoid contact with your eyes,             ears, mouth and genitals (private parts).  Wash genitals (private parts)        with your normal soap.            6.  Wash thoroughly, paying special attention to the area where your surgery               will be performed.            7.  Thoroughly rinse your body with warm water from the neck down.            8.  DO NOT shower/wash with your normal soap after using and rinsing off                the CHG Soap.            9.  Pat yourself dry with a clean towel.            10.  Wear clean pajamas.            11.  Place clean sheets on your bed the  night of your first shower and do not           sleep with pets.  Day of Surgery  Do not apply any lotions/deoderants the morning of surgery.  Please wear clean clothes to the hospital/surgery center.  Please read over the following fact sheets that you were given. MRSA Information and Surgical Site Infection Prevention

## 2017-03-30 NOTE — Pre-Procedure Instructions (Signed)
Kelly Hardy  03/30/2017      [facility] - [address], [place], [place] - [address] [address] Phone: [phone] Fax: [phone]    Your procedure is scheduled on  Wednesday 04/04/17  Report to [facility] Admitting at 630 A.M.  Call this number if you have problems the morning of surgery:  [phone]   Remember:  Do not eat food or drink liquids after midnight.  Take these medicines the morning of surgery with A SIP OF WATER  METOPROLOL (TOPROL)  7 days prior to surgery STOP taking any Aspirin(unless otherwise instructed by your surgeon), Aleve, Naproxen, Ibuprofen, Motrin, Advil, Goody's, BC's, all herbal medications, fish oil, and all vitamins    How to Manage Your Diabetes Before and After Surgery  Why is it important to control my blood sugar before and after surgery? . Improving blood sugar levels before and after surgery helps healing and can limit problems. . A way of improving blood sugar control is eating a healthy diet by: o  Eating less sugar and carbohydrates o  Increasing activity/exercise o  Talking with your doctor about reaching your blood sugar goals . High blood sugars (greater than 180 mg/dL) can raise your risk of infections and slow your recovery, so you will need to focus on controlling your diabetes during the weeks before surgery. . Make sure that the doctor who takes care of your diabetes knows about your planned surgery including the date and location.  How do I manage my blood sugar before surgery? . Check your blood sugar at least 4 times a day, starting 2 days before surgery, to make sure that the level is not too high or low. o Check your blood sugar the morning of your surgery when you wake up and every 2 hours until you get to the Short Stay unit. . If your blood sugar is less than 70 mg/dL, you will need to treat for low blood sugar: o Do not take insulin. o Treat a low blood sugar (less than  70 mg/dL) with  cup of clear juice (cranberry or apple), 4 glucose tablets, OR glucose gel. Recheck blood sugar in 15 minutes after treatment (to make sure it is greater than 70 mg/dL). If your blood sugar is not greater than 70 mg/dL on recheck, call [phone] o  for further instructions. . Report your blood sugar to the short stay nurse when you get to Short Stay.  . If you are admitted to the hospital after surgery: o Your blood sugar will be checked by the staff and you will probably be given insulin after surgery (instead of oral diabetes medicines) to make sure you have good blood sugar levels. o The goal for blood sugar control after surgery is 80-180 mg/dL.              WHAT DO I DO ABOUT MY DIABETES MEDICATION?   Marland Kitchen Do not take oral diabetes medicines (pills) the morning of surgery.  . THE NIGHT BEFORE SURGERY, take ___________ units of ___________insulin.       . THE MORNING OF SURGERY, take _____________ units of __________insulin.  . The day of surgery, do not take other diabetes injectables, including Byetta (exenatide), Bydureon (exenatide ER), Victoza (liraglutide), or Trulicity (dulaglutide).  . If your CBG is greater than 220 mg/dL, you may take  of your sliding scale (correction) dose of insulin.  Other Instructions:  Patient Signature:  Date:   Nurse Signature:  Date:   Reviewed and Endorsed by [facility] Patient Education Committee, August 2015  Do not wear jewelry, make-up or nail polish.  Do not wear lotions, powders, or perfumes, or deodorant.  Do not shave 48 hours prior to surgery.  Men may shave face and neck.  Do not bring valuables to the hospital.  [facility] is not responsible for any belongings or valuables.  Contacts, dentures or bridgework may not be worn into surgery.  Leave your suitcase in the car.  After surgery it may be brought to your room.  For patients admitted to the hospital, discharge time will be  determined by your treatment team.  Patients discharged the day of surgery will not be allowed to drive home.   Name and phone number of your driver:    Special instructions:  [place] - Preparing for Surgery  Before surgery, you can play an important role.  Because skin is not sterile, your skin needs to be as free of germs as possible.  You can reduce the number of germs on you skin by washing with CHG (chlorahexidine gluconate) soap before surgery.  CHG is an antiseptic cleaner which kills germs and bonds with the skin to continue killing germs even after washing.  Please DO NOT use if you have an allergy to CHG or antibacterial soaps.  If your skin becomes reddened/irritated stop using the CHG and inform your nurse when you arrive at Short Stay.  Do not shave (including legs and underarms) for at least 48 hours prior to the first CHG shower.  You may shave your face.  Please follow these instructions carefully:   1.  Shower with CHG Soap the night before surgery and the                                morning of Surgery.  2.  If you choose to wash your hair, wash your hair first as usual with your       normal shampoo.  3.  After you shampoo, rinse your hair and body thoroughly to remove the                      Shampoo.  4.  Use CHG as you would any other liquid soap.  You can apply chg directly       to the skin and wash gently with scrungie or a clean washcloth.  5.  Apply the CHG Soap to your body ONLY FROM THE NECK DOWN.        Do not use on open wounds or open sores.  Avoid contact with your eyes,       ears, mouth and genitals (private parts).  Wash genitals (private parts)       with your normal soap.  6.  Wash thoroughly, paying special attention to the area where your surgery        will be performed.  7.  Thoroughly rinse your body with warm water from the neck down.  8.  DO NOT shower/wash with your normal soap after using and rinsing off       the CHG Soap.  9.  Pat yourself  dry with a clean towel.            10.  Wear clean pajamas.  11.  Place clean sheets on your bed the night of your first shower and do not        sleep with pets.  Day of Surgery  Do not apply any lotions/deoderants the morning of surgery.  Please wear clean clothes to the hospital/surgery center.    Please read over the following fact sheets that you were given. MRSA Information and Surgical Site Infection Prevention

## 2017-04-02 ENCOUNTER — Encounter (HOSPITAL_COMMUNITY): Payer: Self-pay

## 2017-04-02 ENCOUNTER — Other Ambulatory Visit: Payer: Self-pay

## 2017-04-02 ENCOUNTER — Ambulatory Visit (HOSPITAL_COMMUNITY)
Admission: RE | Admit: 2017-04-02 | Discharge: 2017-04-02 | Disposition: A | Discharge status: Home / Self Care | Payer: Commercial Managed Care - PPO | Source: Ambulatory Visit | Attending: Thoracic Surgery (Cardiothoracic Vascular Surgery) | Admitting: Thoracic Surgery (Cardiothoracic Vascular Surgery)

## 2017-04-02 ENCOUNTER — Encounter (HOSPITAL_COMMUNITY)
Admission: RE | Admit: 2017-04-02 | Discharge: 2017-04-02 | Disposition: A | Discharge status: Home / Self Care | Payer: Commercial Managed Care - PPO | Source: Ambulatory Visit | Attending: Thoracic Surgery (Cardiothoracic Vascular Surgery) | Admitting: Thoracic Surgery (Cardiothoracic Vascular Surgery)

## 2017-04-02 DIAGNOSIS — I2510 Atherosclerotic heart disease of native coronary artery without angina pectoris: Secondary | ICD-10-CM

## 2017-04-02 DIAGNOSIS — Z01818 Encounter for other preprocedural examination: Secondary | ICD-10-CM

## 2017-04-02 LAB — BLOOD GAS, ARTERIAL
Acid-Base Excess: 3.7 mmol/L — ABNORMAL HIGH (ref 0.0–2.0)
BICARBONATE: 27.9 mmol/L (ref 20.0–28.0)
DRAWN BY: 421801
FIO2: 21
O2 Saturation: 98.4 %
PATIENT TEMPERATURE: 98.6
pCO2 arterial: 44.1 mmHg (ref 32.0–48.0)
pH, Arterial: 7.418 (ref 7.350–7.450)
pO2, Arterial: 114 mmHg — ABNORMAL HIGH (ref 83.0–108.0)

## 2017-04-02 LAB — URINALYSIS, ROUTINE W REFLEX MICROSCOPIC
Bacteria, UA: NONE SEEN
Bilirubin Urine: NEGATIVE
Hgb urine dipstick: NEGATIVE
Ketones, ur: NEGATIVE mg/dL
Leukocytes, UA: NEGATIVE
Nitrite: NEGATIVE
PH: 6 (ref 5.0–8.0)
Protein, ur: NEGATIVE mg/dL
RBC / HPF: NONE SEEN RBC/hpf (ref 0–5)
Specific Gravity, Urine: 1.009 (ref 1.005–1.030)

## 2017-04-02 LAB — HEMOGLOBIN A1C
Hgb A1c MFr Bld: 8.5 % — ABNORMAL HIGH (ref 4.8–5.6)
Mean Plasma Glucose: 197.25 mg/dL

## 2017-04-02 LAB — COMPREHENSIVE METABOLIC PANEL
ALBUMIN: 4 g/dL (ref 3.5–5.0)
ALT: 26 U/L (ref 17–63)
AST: 28 U/L (ref 15–41)
Alkaline Phosphatase: 46 U/L (ref 38–126)
Anion gap: 8 (ref 5–15)
BILIRUBIN TOTAL: 0.7 mg/dL (ref 0.3–1.2)
BUN: 14 mg/dL (ref 6–20)
CHLORIDE: 103 mmol/L (ref 101–111)
CO2: 24 mmol/L (ref 22–32)
Calcium: 9.3 mg/dL (ref 8.9–10.3)
Creatinine, Ser: 0.81 mg/dL (ref 0.61–1.24)
GFR calc Af Amer: >60 mL/min (ref >60)
GFR calc non Af Amer: >60 mL/min (ref >60)
GLUCOSE: 161 mg/dL — AB (ref 65–99)
POTASSIUM: 3.8 mmol/L (ref 3.5–5.1)
Sodium: 135 mmol/L (ref 135–145)
TOTAL PROTEIN: 7.3 g/dL (ref 6.5–8.1)

## 2017-04-02 LAB — CBC
HEMATOCRIT: 38 % — AB (ref 39.0–52.0)
Hemoglobin: 12.6 g/dL — ABNORMAL LOW (ref 13.0–17.0)
MCH: 30.4 pg (ref 26.0–34.0)
MCHC: 33.2 g/dL (ref 30.0–36.0)
MCV: 91.6 fL (ref 78.0–100.0)
Platelets: 208 10*3/uL (ref 150–400)
RBC: 4.15 MIL/uL — ABNORMAL LOW (ref 4.22–5.81)
RDW: 12.9 % (ref 11.5–15.5)
WBC: 5.1 10*3/uL (ref 4.0–10.5)

## 2017-04-02 LAB — PROTIME-INR
INR: 0.97
Prothrombin Time: 12.8 seconds (ref 11.4–15.2)

## 2017-04-02 LAB — GLUCOSE, CAPILLARY: Glucose-Capillary: 190 mg/dL — ABNORMAL HIGH (ref 65–99)

## 2017-04-02 LAB — APTT: aPTT: 29 seconds (ref 24–36)

## 2017-04-02 LAB — SURGICAL PCR SCREEN
MRSA, PCR: NEGATIVE
Staphylococcus aureus: NEGATIVE

## 2017-04-02 LAB — ABO/RH: ABO/RH(D): A POS

## 2017-04-02 MED ORDER — MAGNESIUM SULFATE 50 % IJ SOLN
40.0000 meq | INTRAMUSCULAR | Status: DC
  Filled 2017-04-02: qty 9.85

## 2017-04-02 MED ORDER — DEXMEDETOMIDINE HCL IN NACL 400 MCG/100ML IV SOLN
0.1000 ug/kg/h | INTRAVENOUS | Status: AC
  Administered 2017-04-04: 0.7 ug/kg/h via INTRAVENOUS
  Filled 2017-04-02: qty 100

## 2017-04-02 MED ORDER — TRANEXAMIC ACID (OHS) BOLUS VIA INFUSION
15.0000 mg/kg | INTRAVENOUS | Status: AC
  Administered 2017-04-04: 1020 mg via INTRAVENOUS
  Filled 2017-04-02: qty 1020

## 2017-04-02 MED ORDER — SODIUM CHLORIDE 0.9 % IV SOLN
INTRAVENOUS | Status: DC
  Filled 2017-04-02: qty 30

## 2017-04-02 MED ORDER — POTASSIUM CHLORIDE 2 MEQ/ML IV SOLN
80.0000 meq | INTRAVENOUS | Status: DC
  Filled 2017-04-02: qty 40

## 2017-04-02 MED ORDER — SODIUM CHLORIDE 0.9 % IV SOLN
INTRAVENOUS | Status: AC
  Administered 2017-04-04: 1.8 [IU]/h via INTRAVENOUS
  Filled 2017-04-02: qty 1

## 2017-04-02 MED ORDER — NITROGLYCERIN IN D5W 200-5 MCG/ML-% IV SOLN
2.0000 ug/min | INTRAVENOUS | Status: AC
  Administered 2017-04-04: 16.6 ug/min via INTRAVENOUS
  Filled 2017-04-02: qty 250

## 2017-04-02 MED ORDER — DEXTROSE 5 % IV SOLN
1.5000 g | INTRAVENOUS | Status: DC
  Filled 2017-04-02 (×2): qty 1.5

## 2017-04-02 MED ORDER — SODIUM CHLORIDE 0.9 % IV SOLN
1.5000 mg/kg/h | INTRAVENOUS | Status: AC
  Administered 2017-04-04: 1.5 mg/kg/h via INTRAVENOUS
  Filled 2017-04-02: qty 25

## 2017-04-02 MED ORDER — DEXTROSE 5 % IV SOLN
0.0000 ug/min | INTRAVENOUS | Status: DC
  Filled 2017-04-02: qty 4

## 2017-04-02 MED ORDER — DOPAMINE-DEXTROSE 3.2-5 MG/ML-% IV SOLN
0.0000 ug/kg/min | INTRAVENOUS | Status: DC
  Filled 2017-04-02: qty 250

## 2017-04-02 MED ORDER — SODIUM CHLORIDE 0.9 % IV SOLN
1250.0000 mg | INTRAVENOUS | Status: DC
  Filled 2017-04-02: qty 1250

## 2017-04-02 MED ORDER — DEXTROSE 5 % IV SOLN
750.0000 mg | INTRAVENOUS | Status: DC
  Filled 2017-04-02: qty 750

## 2017-04-02 MED ORDER — TRANEXAMIC ACID (OHS) PUMP PRIME SOLUTION
2.0000 mg/kg | INTRAVENOUS | Status: DC
  Filled 2017-04-02: qty 1.36

## 2017-04-02 MED ORDER — PHENYLEPHRINE HCL 10 MG/ML IJ SOLN
30.0000 ug/min | INTRAMUSCULAR | Status: DC
  Filled 2017-04-02: qty 2

## 2017-04-02 MED ORDER — PLASMA-LYTE 148 IV SOLN
INTRAVENOUS | Status: AC
  Administered 2017-04-04: 500 mL
  Filled 2017-04-02: qty 2.5

## 2017-04-02 NOTE — Progress Notes (Signed)
   04/02/17 0823  OBSTRUCTIVE SLEEP APNEA  Have you ever been diagnosed with sleep apnea through a sleep study? No  Do you snore loudly (loud enough to be heard through closed doors)?  1  Do you often feel tired, fatigued, or sleepy during the daytime (such as falling asleep during driving or talking to someone)? 0  Has anyone observed you stop breathing during your sleep? 1  Do you have, or are you being treated for high blood pressure? 1  BMI more than 35 kg/m2? 0  Age > 50 (1-yes) 1  Neck circumference greater than:Male 16 inches or larger, Male 17inches or larger? 0  Male Gender (Yes=1) 1  Obstructive Sleep Apnea Score 5  Score 5 or greater  Results sent to PCP

## 2017-04-02 NOTE — Progress Notes (Addendum)
PCP -  Dr. Consuello Masse- [facility]  Cardiologist - Dr. Danella Deis- [facility]  Chest x-ray - 04/02/17  EKG - 03/23/17 (E)  Stress Test - 02/09/16 (E)  ECHO - 10/27/04 (E)  Cardiac Cath - 03/23/17 (E)  Sleep Study - No + STOP BANG- Sent to PCP CPAP - None  LABS- 04/02/17: CBC, CMP,PT,PTT,ABG,T/S, HA1C, UA  HA1C- 04/02/17 Fasting Blood Sugar - 140-200, Today, 190 Checks Blood Sugar _5-6____ times a day Pt sts he is a Type I diabetic, medical history updated. Pt was also told to continue taking ASA.  Anesthesia- Yes- Cardiac history  Pt denies having chest pain, sob, or fever at this time. All instructions explained to the pt, with a verbal understanding of the material. Pt agrees to go over the instructions while at home for a better understanding. The opportunity to ask questions was provided.

## 2017-04-03 MED ORDER — METOPROLOL TARTRATE 12.5 MG HALF TABLET: 12.5000 mg | ORAL_TABLET | Freq: Once | ORAL | Status: DC

## 2017-04-03 MED ORDER — CHLORHEXIDINE GLUCONATE 0.12 % MT SOLN
15.0000 mL | Freq: Once | OROMUCOSAL | Status: AC
  Administered 2017-04-04: 15 mL via OROMUCOSAL
  Filled 2017-04-03: qty 15

## 2017-04-03 MED ORDER — MILRINONE LACTATE IN DEXTROSE 20-5 MG/100ML-% IV SOLN
0.1250 ug/kg/min | INTRAVENOUS | Status: DC
Start: 2017-04-04 — End: 2017-04-04
  Filled 2017-04-03: qty 100

## 2017-04-03 NOTE — Anesthesia Preprocedure Evaluation (Addendum)
Anesthesia Evaluation  Patient identified by MRN, date of birth, ID band Patient awake    Reviewed: Allergy & Precautions, NPO status , Patient's Chart, lab work & pertinent test results  Airway Mallampati: II  TM Distance: >3 FB Neck ROM: Full    Dental  (+) Dental Advisory Given   Pulmonary neg pulmonary ROS,    breath sounds clear to auscultation       Cardiovascular hypertension, Pt. on medications and Pt. on home beta blockers + angina + CAD and + Cardiac Stents   Rhythm:Regular Rate:Normal     Neuro/Psych negative neurological ROS     GI/Hepatic Neg liver ROS, GERD  ,  Endo/Other  diabetes, Type 1, Insulin Dependent  Renal/GU negative Renal ROS     Musculoskeletal   Abdominal   Peds  Hematology negative hematology ROS (+)   Anesthesia Other Findings   Reproductive/Obstetrics                            Lab Results  Component Value Date   WBC 5.1 04/02/2017   HGB 12.6 (L) 04/02/2017   HCT 38.0 (L) 04/02/2017   MCV 91.6 04/02/2017   PLT 208 04/02/2017   Lab Results  Component Value Date   CREATININE 0.81 04/02/2017   BUN 14 04/02/2017   NA 135 04/02/2017   K 3.8 04/02/2017   CL 103 04/02/2017   CO2 24 04/02/2017    Anesthesia Physical Anesthesia Plan  ASA: IV  Anesthesia Plan: General   Post-op Pain Management:    Induction: Intravenous  PONV Risk Score and Plan: 2 and Ondansetron, Treatment may vary due to age or medical condition and Midazolam  Airway Management Planned: Oral ETT  Additional Equipment: Arterial line, CVP, PA Cath, TEE and Ultrasound Guidance Line Placement  Intra-op Plan:   Post-operative Plan: Post-operative intubation/ventilation  Informed Consent: I have reviewed the patients History and Physical, chart, labs and discussed the procedure including the risks, benefits and alternatives for the proposed anesthesia with the patient or  authorized representative who has indicated his/her understanding and acceptance.   Dental advisory given  Plan Discussed with: CRNA and Surgeon  Anesthesia Plan Comments:        Anesthesia Quick Evaluation

## 2017-04-04 ENCOUNTER — Inpatient Hospital Stay (HOSPITAL_COMMUNITY): Payer: Commercial Managed Care - PPO | Admitting: Critical Care Medicine

## 2017-04-04 ENCOUNTER — Inpatient Hospital Stay (HOSPITAL_COMMUNITY)
Admission: RE | Disposition: A | Discharge status: Home / Self Care | Payer: Self-pay | Source: Ambulatory Visit | Attending: Thoracic Surgery (Cardiothoracic Vascular Surgery)

## 2017-04-04 ENCOUNTER — Inpatient Hospital Stay (HOSPITAL_COMMUNITY)
Admission: RE | Admit: 2017-04-04 | Discharge: 2017-04-08 | DRG: 236 | Disposition: A | Discharge status: Home / Self Care | Payer: Commercial Managed Care - PPO | Source: Ambulatory Visit | Attending: Thoracic Surgery (Cardiothoracic Vascular Surgery) | Admitting: Thoracic Surgery (Cardiothoracic Vascular Surgery)

## 2017-04-04 ENCOUNTER — Inpatient Hospital Stay (HOSPITAL_COMMUNITY): Payer: Commercial Managed Care - PPO

## 2017-04-04 ENCOUNTER — Encounter (HOSPITAL_COMMUNITY): Payer: Self-pay | Admitting: *Deleted

## 2017-04-04 DIAGNOSIS — K219 Gastro-esophageal reflux disease without esophagitis: Secondary | ICD-10-CM | POA: Diagnosis present

## 2017-04-04 DIAGNOSIS — I2510 Atherosclerotic heart disease of native coronary artery without angina pectoris: Secondary | ICD-10-CM | POA: Diagnosis not present

## 2017-04-04 DIAGNOSIS — Z794 Long term (current) use of insulin: Secondary | ICD-10-CM | POA: Diagnosis not present

## 2017-04-04 DIAGNOSIS — I25110 Atherosclerotic heart disease of native coronary artery with unstable angina pectoris: Secondary | ICD-10-CM | POA: Diagnosis present

## 2017-04-04 DIAGNOSIS — E109 Type 1 diabetes mellitus without complications: Secondary | ICD-10-CM | POA: Diagnosis present

## 2017-04-04 DIAGNOSIS — Z7982 Long term (current) use of aspirin: Secondary | ICD-10-CM

## 2017-04-04 DIAGNOSIS — E785 Hyperlipidemia, unspecified: Secondary | ICD-10-CM | POA: Diagnosis present

## 2017-04-04 DIAGNOSIS — J939 Pneumothorax, unspecified: Secondary | ICD-10-CM

## 2017-04-04 DIAGNOSIS — Z955 Presence of coronary angioplasty implant and graft: Secondary | ICD-10-CM | POA: Diagnosis not present

## 2017-04-04 DIAGNOSIS — J9811 Atelectasis: Secondary | ICD-10-CM | POA: Diagnosis not present

## 2017-04-04 DIAGNOSIS — Z8249 Family history of ischemic heart disease and other diseases of the circulatory system: Secondary | ICD-10-CM

## 2017-04-04 DIAGNOSIS — I10 Essential (primary) hypertension: Secondary | ICD-10-CM | POA: Diagnosis present

## 2017-04-04 DIAGNOSIS — I25119 Atherosclerotic heart disease of native coronary artery with unspecified angina pectoris: Secondary | ICD-10-CM | POA: Diagnosis present

## 2017-04-04 DIAGNOSIS — E871 Hypo-osmolality and hyponatremia: Secondary | ICD-10-CM | POA: Diagnosis not present

## 2017-04-04 DIAGNOSIS — Z79899 Other long term (current) drug therapy: Secondary | ICD-10-CM | POA: Diagnosis not present

## 2017-04-04 DIAGNOSIS — Z7901 Long term (current) use of anticoagulants: Secondary | ICD-10-CM | POA: Diagnosis not present

## 2017-04-04 DIAGNOSIS — Z951 Presence of aortocoronary bypass graft: Secondary | ICD-10-CM

## 2017-04-04 DIAGNOSIS — J95811 Postprocedural pneumothorax: Secondary | ICD-10-CM

## 2017-04-04 DIAGNOSIS — Z09 Encounter for follow-up examination after completed treatment for conditions other than malignant neoplasm: Secondary | ICD-10-CM

## 2017-04-04 HISTORY — PX: TEE WITHOUT CARDIOVERSION: SHX5443

## 2017-04-04 HISTORY — PX: CORONARY ARTERY BYPASS GRAFT: SHX141

## 2017-04-04 LAB — GLUCOSE, CAPILLARY
GLUCOSE-CAPILLARY: 98 mg/dL (ref 65–99)
GLUCOSE-CAPILLARY: 120 mg/dL — AB (ref 65–99)
GLUCOSE-CAPILLARY: 143 mg/dL — AB (ref 65–99)
GLUCOSE-CAPILLARY: 108 mg/dL — AB (ref 65–99)
Glucose-Capillary: 139 mg/dL — ABNORMAL HIGH (ref 65–99)
Glucose-Capillary: 141 mg/dL — ABNORMAL HIGH (ref 65–99)
Glucose-Capillary: 118 mg/dL — ABNORMAL HIGH (ref 65–99)
Glucose-Capillary: 101 mg/dL — ABNORMAL HIGH (ref 65–99)
Glucose-Capillary: 97 mg/dL (ref 65–99)

## 2017-04-04 LAB — POCT I-STAT 3, ART BLOOD GAS (G3+)
ACID-BASE DEFICIT: 4 mmol/L — AB (ref 0.0–2.0)
Acid-Base Excess: 2 mmol/L (ref 0.0–2.0)
Acid-base deficit: 5 mmol/L — ABNORMAL HIGH (ref 0.0–2.0)
Bicarbonate: 25.9 mmol/L (ref 20.0–28.0)
Bicarbonate: 23.3 mmol/L (ref 20.0–28.0)
Bicarbonate: 19.8 mmol/L — ABNORMAL LOW (ref 20.0–28.0)
Bicarbonate: 21.7 mmol/L (ref 20.0–28.0)
O2 SAT: 100 %
O2 SAT: 100 %
O2 SAT: 100 %
O2 SAT: 100 %
PCO2 ART: 36.2 mmHg (ref 32.0–48.0)
PCO2 ART: 27.7 mmHg — AB (ref 32.0–48.0)
PCO2 ART: 31.5 mmHg — AB (ref 32.0–48.0)
PO2 ART: 330 mmHg — AB (ref 83.0–108.0)
Patient temperature: 36.5
Patient temperature: 36.7
TCO2: 27 mmol/L (ref 22–32)
TCO2: 24 mmol/L (ref 22–32)
TCO2: 21 mmol/L — ABNORMAL LOW (ref 22–32)
TCO2: 23 mmol/L (ref 22–32)
pCO2 arterial: 46.3 mmHg (ref 32.0–48.0)
pH, Arterial: 7.463 — ABNORMAL HIGH (ref 7.350–7.450)
pH, Arterial: 7.522 — ABNORMAL HIGH (ref 7.350–7.450)
pH, Arterial: 7.405 (ref 7.350–7.450)
pH, Arterial: 7.277 — ABNORMAL LOW (ref 7.350–7.450)
pO2, Arterial: 238 mmHg — ABNORMAL HIGH (ref 83.0–108.0)
pO2, Arterial: 207 mmHg — ABNORMAL HIGH (ref 83.0–108.0)
pO2, Arterial: 217 mmHg — ABNORMAL HIGH (ref 83.0–108.0)

## 2017-04-04 LAB — POCT I-STAT 4, (NA,K, GLUC, HGB,HCT)
GLUCOSE: 87 mg/dL (ref 65–99)
HEMATOCRIT: 24 % — AB (ref 39.0–52.0)
Hemoglobin: 8.2 g/dL — ABNORMAL LOW (ref 13.0–17.0)
POTASSIUM: 2.4 mmol/L — AB (ref 3.5–5.1)
SODIUM: 145 mmol/L (ref 135–145)

## 2017-04-04 LAB — POCT I-STAT, CHEM 8
BUN: 16 mg/dL (ref 6–20)
BUN: 15 mg/dL (ref 6–20)
BUN: 15 mg/dL (ref 6–20)
BUN: 15 mg/dL (ref 6–20)
BUN: 16 mg/dL (ref 6–20)
CALCIUM ION: 1.2 mmol/L (ref 1.15–1.40)
CALCIUM ION: 1.14 mmol/L — AB (ref 1.15–1.40)
CHLORIDE: 99 mmol/L — AB (ref 101–111)
CHLORIDE: 101 mmol/L (ref 101–111)
CHLORIDE: 101 mmol/L (ref 101–111)
CHLORIDE: 106 mmol/L (ref 101–111)
CHLORIDE: 105 mmol/L (ref 101–111)
Calcium, Ion: 1.26 mmol/L (ref 1.15–1.40)
Calcium, Ion: 1.18 mmol/L (ref 1.15–1.40)
Calcium, Ion: 1.22 mmol/L (ref 1.15–1.40)
Creatinine, Ser: 0.6 mg/dL — ABNORMAL LOW (ref 0.61–1.24)
Creatinine, Ser: 0.6 mg/dL — ABNORMAL LOW (ref 0.61–1.24)
Creatinine, Ser: 0.5 mg/dL — ABNORMAL LOW (ref 0.61–1.24)
Creatinine, Ser: 0.6 mg/dL — ABNORMAL LOW (ref 0.61–1.24)
Creatinine, Ser: 0.5 mg/dL — ABNORMAL LOW (ref 0.61–1.24)
GLUCOSE: 123 mg/dL — AB (ref 65–99)
GLUCOSE: 132 mg/dL — AB (ref 65–99)
Glucose, Bld: 152 mg/dL — ABNORMAL HIGH (ref 65–99)
Glucose, Bld: 150 mg/dL — ABNORMAL HIGH (ref 65–99)
Glucose, Bld: 154 mg/dL — ABNORMAL HIGH (ref 65–99)
HCT: 33 % — ABNORMAL LOW (ref 39.0–52.0)
HCT: 28 % — ABNORMAL LOW (ref 39.0–52.0)
HCT: 27 % — ABNORMAL LOW (ref 39.0–52.0)
HCT: 29 % — ABNORMAL LOW (ref 39.0–52.0)
HEMATOCRIT: 31 % — AB (ref 39.0–52.0)
Hemoglobin: 11.2 g/dL — ABNORMAL LOW (ref 13.0–17.0)
Hemoglobin: 10.5 g/dL — ABNORMAL LOW (ref 13.0–17.0)
Hemoglobin: 9.5 g/dL — ABNORMAL LOW (ref 13.0–17.0)
Hemoglobin: 9.2 g/dL — ABNORMAL LOW (ref 13.0–17.0)
Hemoglobin: 9.9 g/dL — ABNORMAL LOW (ref 13.0–17.0)
POTASSIUM: 4.1 mmol/L (ref 3.5–5.1)
POTASSIUM: 3.7 mmol/L (ref 3.5–5.1)
POTASSIUM: 4.4 mmol/L (ref 3.5–5.1)
Potassium: 3.6 mmol/L (ref 3.5–5.1)
Potassium: 3.6 mmol/L (ref 3.5–5.1)
SODIUM: 140 mmol/L (ref 135–145)
SODIUM: 139 mmol/L (ref 135–145)
SODIUM: 141 mmol/L (ref 135–145)
Sodium: 140 mmol/L (ref 135–145)
Sodium: 140 mmol/L (ref 135–145)
TCO2: 29 mmol/L (ref 22–32)
TCO2: 26 mmol/L (ref 22–32)
TCO2: 27 mmol/L (ref 22–32)
TCO2: 22 mmol/L (ref 22–32)
TCO2: 23 mmol/L (ref 22–32)

## 2017-04-04 LAB — CBC
HEMATOCRIT: 30.3 % — AB (ref 39.0–52.0)
HEMATOCRIT: 30.8 % — AB (ref 39.0–52.0)
HEMOGLOBIN: 9.9 g/dL — AB (ref 13.0–17.0)
HEMOGLOBIN: 10.3 g/dL — AB (ref 13.0–17.0)
MCH: 28.9 pg (ref 26.0–34.0)
MCH: 30.5 pg (ref 26.0–34.0)
MCHC: 32.7 g/dL (ref 30.0–36.0)
MCHC: 33.4 g/dL (ref 30.0–36.0)
MCV: 88.3 fL (ref 78.0–100.0)
MCV: 91.1 fL (ref 78.0–100.0)
Platelets: 175 10*3/uL (ref 150–400)
Platelets: 180 10*3/uL (ref 150–400)
RBC: 3.43 MIL/uL — AB (ref 4.22–5.81)
RBC: 3.38 MIL/uL — AB (ref 4.22–5.81)
RDW: 12.6 % (ref 11.5–15.5)
RDW: 12.9 % (ref 11.5–15.5)
WBC: 5.9 10*3/uL (ref 4.0–10.5)
WBC: 10.2 10*3/uL (ref 4.0–10.5)

## 2017-04-04 LAB — CREATININE, SERUM
Creatinine, Ser: 0.71 mg/dL (ref 0.61–1.24)
GFR calc non Af Amer: >60 mL/min (ref >60)

## 2017-04-04 LAB — ECHO TEE
AV Mean grad: 3 mmHg
AV Peak grad: 6 mmHg
AVPKVEL: 119 cm/s
DOP CAL AO MEAN VELOCITY: 76.9 cm/s
LVOT area: 4.52 cm2
LVOTD: 24 mm
VTI: 31.4 cm

## 2017-04-04 LAB — PREPARE RBC (CROSSMATCH)

## 2017-04-04 LAB — MAGNESIUM: MAGNESIUM: 3 mg/dL — AB (ref 1.7–2.4)

## 2017-04-04 LAB — APTT: aPTT: 31 seconds (ref 24–36)

## 2017-04-04 LAB — PROTIME-INR
INR: 1.21
PROTHROMBIN TIME: 15.2 s (ref 11.4–15.2)

## 2017-04-04 SURGERY — CORONARY ARTERY BYPASS GRAFTING (CABG)
Anesthesia: General | Site: Chest

## 2017-04-04 MED ORDER — PROPOFOL 10 MG/ML IV BOLUS
INTRAVENOUS | Status: AC
  Filled 2017-04-04: qty 20

## 2017-04-04 MED ORDER — PHENYLEPHRINE 40 MCG/ML (10ML) SYRINGE FOR IV PUSH (FOR BLOOD PRESSURE SUPPORT)
PREFILLED_SYRINGE | INTRAVENOUS | Status: DC | PRN
  Administered 2017-04-04: 40 ug via INTRAVENOUS
  Administered 2017-04-04 (×2): 80 ug via INTRAVENOUS
  Administered 2017-04-04 (×3): 40 ug via INTRAVENOUS

## 2017-04-04 MED ORDER — SODIUM CHLORIDE 0.9 % IV SOLN
0.0000 ug/kg/h | INTRAVENOUS | Status: DC
  Filled 2017-04-04: qty 2

## 2017-04-04 MED ORDER — PANTOPRAZOLE SODIUM 40 MG PO TBEC
40.0000 mg | DELAYED_RELEASE_TABLET | Freq: Every day | ORAL | Status: DC
  Administered 2017-04-06 – 2017-04-08 (×3): 40 mg via ORAL
  Filled 2017-04-04 (×3): qty 1

## 2017-04-04 MED ORDER — FENTANYL CITRATE (PF) 250 MCG/5ML IJ SOLN
INTRAMUSCULAR | Status: DC | PRN
  Administered 2017-04-04: 50 ug via INTRAVENOUS
  Administered 2017-04-04: 150 ug via INTRAVENOUS
  Administered 2017-04-04: 100 ug via INTRAVENOUS
  Administered 2017-04-04 (×2): 50 ug via INTRAVENOUS
  Administered 2017-04-04: 250 ug via INTRAVENOUS
  Administered 2017-04-04: 50 ug via INTRAVENOUS
  Administered 2017-04-04: 150 ug via INTRAVENOUS
  Administered 2017-04-04 (×3): 50 ug via INTRAVENOUS
  Administered 2017-04-04: 100 ug via INTRAVENOUS

## 2017-04-04 MED ORDER — SODIUM CHLORIDE 0.9 % IV BOLUS (SEPSIS)
500.0000 mL | Freq: Once | INTRAVENOUS | Status: AC
  Administered 2017-04-04: 500 mL via INTRAVENOUS

## 2017-04-04 MED ORDER — METOPROLOL TARTRATE 25 MG/10 ML ORAL SUSPENSION: 12.5000 mg | Freq: Two times a day (BID) | ORAL | Status: DC

## 2017-04-04 MED ORDER — PROTAMINE SULFATE 10 MG/ML IV SOLN
INTRAVENOUS | Status: AC
  Filled 2017-04-04: qty 10

## 2017-04-04 MED ORDER — CHLORHEXIDINE GLUCONATE 0.12 % MT SOLN
15.0000 mL | Freq: Two times a day (BID) | OROMUCOSAL | Status: DC
  Administered 2017-04-04: 15 mL via OROMUCOSAL

## 2017-04-04 MED ORDER — ROSUVASTATIN CALCIUM 10 MG PO TABS
40.0000 mg | ORAL_TABLET | Freq: Every day | ORAL | Status: DC
  Administered 2017-04-05 – 2017-04-07 (×3): 40 mg via ORAL
  Filled 2017-04-04: qty 1
  Filled 2017-04-04 (×2): qty 4

## 2017-04-04 MED ORDER — PROTAMINE SULFATE 10 MG/ML IV SOLN
INTRAVENOUS | Status: DC | PRN
  Administered 2017-04-04: 100 mg via INTRAVENOUS

## 2017-04-04 MED ORDER — ASPIRIN 81 MG PO CHEW: 81.0000 mg | CHEWABLE_TABLET | Freq: Every day | ORAL | Status: DC

## 2017-04-04 MED ORDER — ROCURONIUM BROMIDE 10 MG/ML (PF) SYRINGE
PREFILLED_SYRINGE | INTRAVENOUS | Status: AC
  Filled 2017-04-04: qty 10

## 2017-04-04 MED ORDER — MORPHINE SULFATE (PF) 4 MG/ML IV SOLN: 1.0000 mg | INTRAVENOUS | Status: AC | PRN

## 2017-04-04 MED ORDER — ACETAMINOPHEN 500 MG PO TABS
1000.0000 mg | ORAL_TABLET | Freq: Four times a day (QID) | ORAL | Status: DC
  Administered 2017-04-04 – 2017-04-08 (×13): 1000 mg via ORAL
  Filled 2017-04-04 (×15): qty 2

## 2017-04-04 MED ORDER — ORAL CARE MOUTH RINSE
15.0000 mL | Freq: Two times a day (BID) | OROMUCOSAL | Status: DC
  Administered 2017-04-04: 15 mL via OROMUCOSAL

## 2017-04-04 MED ORDER — LIDOCAINE HCL (CARDIAC) 20 MG/ML IV SOLN
INTRAVENOUS | Status: DC | PRN
  Administered 2017-04-04: 80 mg via INTRAVENOUS

## 2017-04-04 MED ORDER — FAMOTIDINE IN NACL 20-0.9 MG/50ML-% IV SOLN
20.0000 mg | Freq: Two times a day (BID) | INTRAVENOUS | Status: AC
  Administered 2017-04-04: 20 mg via INTRAVENOUS

## 2017-04-04 MED ORDER — OXYCODONE HCL 5 MG PO TABS
5.0000 mg | ORAL_TABLET | ORAL | Status: DC | PRN
  Administered 2017-04-05 – 2017-04-06 (×7): 5 mg via ORAL
  Filled 2017-04-04: qty 2
  Filled 2017-04-04 (×7): qty 1

## 2017-04-04 MED ORDER — SODIUM CHLORIDE 0.9 % IV SOLN
INTRAVENOUS | Status: DC | PRN
  Administered 2017-04-04: 1250 mg via INTRAVENOUS

## 2017-04-04 MED ORDER — SODIUM CHLORIDE 0.9% FLUSH: 10.0000 mL | INTRAVENOUS | Status: DC | PRN

## 2017-04-04 MED ORDER — ACETAMINOPHEN 160 MG/5ML PO SOLN: 1000.0000 mg | Freq: Four times a day (QID) | ORAL | Status: DC

## 2017-04-04 MED ORDER — ASPIRIN EC 325 MG PO TBEC: 325.0000 mg | DELAYED_RELEASE_TABLET | Freq: Every day | ORAL | Status: DC

## 2017-04-04 MED ORDER — DEXTROSE 5 % IV SOLN
INTRAVENOUS | Status: DC | PRN
  Administered 2017-04-04: 25 ug/min via INTRAVENOUS

## 2017-04-04 MED ORDER — LIDOCAINE 2% (20 MG/ML) 5 ML SYRINGE
INTRAMUSCULAR | Status: AC
  Filled 2017-04-04: qty 10

## 2017-04-04 MED ORDER — ASPIRIN 81 MG PO CHEW: 324.0000 mg | CHEWABLE_TABLET | Freq: Every day | ORAL | Status: DC

## 2017-04-04 MED ORDER — LACTATED RINGERS IV SOLN
INTRAVENOUS | Status: DC
  Administered 2017-04-04: 10 mL/h via INTRAVENOUS

## 2017-04-04 MED ORDER — POTASSIUM CHLORIDE 10 MEQ/50ML IV SOLN
10.0000 meq | INTRAVENOUS | Status: AC
  Administered 2017-04-04 (×3): 10 meq via INTRAVENOUS

## 2017-04-04 MED ORDER — CHLORHEXIDINE GLUCONATE 0.12 % MT SOLN
15.0000 mL | OROMUCOSAL | Status: AC
  Administered 2017-04-04: 15 mL via OROMUCOSAL

## 2017-04-04 MED ORDER — HEPARIN SODIUM (PORCINE) 1000 UNIT/ML IJ SOLN
INTRAMUSCULAR | Status: AC
  Filled 2017-04-04: qty 1

## 2017-04-04 MED ORDER — BISACODYL 5 MG PO TBEC
10.0000 mg | DELAYED_RELEASE_TABLET | Freq: Every day | ORAL | Status: DC
  Administered 2017-04-05 – 2017-04-08 (×4): 10 mg via ORAL
  Filled 2017-04-04 (×4): qty 2

## 2017-04-04 MED ORDER — FENTANYL CITRATE (PF) 250 MCG/5ML IJ SOLN
INTRAMUSCULAR | Status: AC
  Filled 2017-04-04: qty 25

## 2017-04-04 MED ORDER — LACTATED RINGERS IV SOLN
INTRAVENOUS | Status: DC
  Administered 2017-04-04: 20 mL/h via INTRAVENOUS
  Administered 2017-04-05: 23:00:00 via INTRAVENOUS

## 2017-04-04 MED ORDER — CALCIUM CHLORIDE 10 % IV SOLN
INTRAVENOUS | Status: AC
  Filled 2017-04-04: qty 10

## 2017-04-04 MED ORDER — SODIUM CHLORIDE 0.9 % IJ SOLN
OROMUCOSAL | Status: DC | PRN
  Administered 2017-04-04 (×3): 4 mL via TOPICAL

## 2017-04-04 MED ORDER — CHLORHEXIDINE GLUCONATE CLOTH 2 % EX PADS
6.0000 | MEDICATED_PAD | Freq: Every day | CUTANEOUS | Status: DC
  Administered 2017-04-05 – 2017-04-06 (×2): 6 via TOPICAL

## 2017-04-04 MED ORDER — LACTATED RINGERS IV SOLN
INTRAVENOUS | Status: DC | PRN
  Administered 2017-04-04: 07:00:00 via INTRAVENOUS

## 2017-04-04 MED ORDER — MORPHINE SULFATE (PF) 4 MG/ML IV SOLN
2.0000 mg | INTRAVENOUS | Status: DC | PRN
  Administered 2017-04-04: 4 mg via INTRAVENOUS
  Administered 2017-04-04 – 2017-04-05 (×3): 2 mg via INTRAVENOUS
  Filled 2017-04-04 (×5): qty 1

## 2017-04-04 MED ORDER — SODIUM CHLORIDE 0.45 % IV SOLN
INTRAVENOUS | Status: DC | PRN
  Administered 2017-04-04: 20 mL/h via INTRAVENOUS

## 2017-04-04 MED ORDER — TICAGRELOR 90 MG PO TABS
90.0000 mg | ORAL_TABLET | Freq: Two times a day (BID) | ORAL | Status: DC
  Administered 2017-04-05 – 2017-04-08 (×7): 90 mg via ORAL
  Filled 2017-04-04 (×7): qty 1

## 2017-04-04 MED ORDER — ONDANSETRON HCL 4 MG/2ML IJ SOLN
4.0000 mg | Freq: Four times a day (QID) | INTRAMUSCULAR | Status: DC | PRN
  Administered 2017-04-04 – 2017-04-07 (×3): 4 mg via INTRAVENOUS
  Filled 2017-04-04 (×3): qty 2

## 2017-04-04 MED ORDER — MIDAZOLAM HCL 5 MG/5ML IJ SOLN
INTRAMUSCULAR | Status: DC | PRN
  Administered 2017-04-04 (×3): 1 mg via INTRAVENOUS

## 2017-04-04 MED ORDER — SODIUM CHLORIDE 0.9 % IJ SOLN
INTRAMUSCULAR | Status: AC
  Filled 2017-04-04: qty 10

## 2017-04-04 MED ORDER — TICAGRELOR 90 MG PO TABS: 90.0000 mg | ORAL_TABLET | Freq: Two times a day (BID) | ORAL | Status: DC

## 2017-04-04 MED ORDER — BISACODYL 10 MG RE SUPP: 10.0000 mg | Freq: Every day | RECTAL | Status: DC

## 2017-04-04 MED ORDER — LACTATED RINGERS IV SOLN: 500.0000 mL | Freq: Once | INTRAVENOUS | Status: DC | PRN

## 2017-04-04 MED ORDER — DEXTROSE 5 % IV SOLN
INTRAVENOUS | Status: DC | PRN
  Administered 2017-04-04: 1.5 g via INTRAVENOUS

## 2017-04-04 MED ORDER — ALBUMIN HUMAN 5 % IV SOLN
INTRAVENOUS | Status: DC | PRN
  Administered 2017-04-04: 11:00:00 via INTRAVENOUS

## 2017-04-04 MED ORDER — HEPARIN SODIUM (PORCINE) 1000 UNIT/ML IJ SOLN
INTRAMUSCULAR | Status: DC | PRN
  Administered 2017-04-04: 10000 [IU] via INTRAVENOUS
  Administered 2017-04-04: 3000 [IU] via INTRAVENOUS

## 2017-04-04 MED ORDER — CEFUROXIME SODIUM 1.5 G IV SOLR
1.5000 g | Freq: Two times a day (BID) | INTRAVENOUS | Status: AC
  Administered 2017-04-04 – 2017-04-06 (×4): 1.5 g via INTRAVENOUS
  Filled 2017-04-04 (×4): qty 1.5

## 2017-04-04 MED ORDER — CHLORHEXIDINE GLUCONATE 4 % EX LIQD: 30.0000 mL | CUTANEOUS | Status: DC

## 2017-04-04 MED ORDER — ALBUMIN HUMAN 5 % IV SOLN
250.0000 mL | INTRAVENOUS | Status: AC | PRN
  Administered 2017-04-04 (×2): 250 mL via INTRAVENOUS

## 2017-04-04 MED ORDER — ASPIRIN EC 81 MG PO TBEC
81.0000 mg | DELAYED_RELEASE_TABLET | Freq: Every day | ORAL | Status: DC
  Administered 2017-04-05 – 2017-04-08 (×4): 81 mg via ORAL
  Filled 2017-04-04 (×4): qty 1

## 2017-04-04 MED ORDER — ACETAMINOPHEN 650 MG RE SUPP
650.0000 mg | Freq: Once | RECTAL | Status: AC
  Administered 2017-04-04: 650 mg via RECTAL

## 2017-04-04 MED ORDER — INSULIN REGULAR BOLUS VIA INFUSION
0.0000 [IU] | Freq: Three times a day (TID) | INTRAVENOUS | Status: DC
  Filled 2017-04-04: qty 10

## 2017-04-04 MED ORDER — MIDAZOLAM HCL 2 MG/2ML IJ SOLN
2.0000 mg | INTRAMUSCULAR | Status: DC | PRN
  Administered 2017-04-04: 2 mg via INTRAVENOUS
  Filled 2017-04-04: qty 2

## 2017-04-04 MED ORDER — ARTIFICIAL TEARS OPHTHALMIC OINT
TOPICAL_OINTMENT | OPHTHALMIC | Status: DC | PRN
  Administered 2017-04-04: 1 via OPHTHALMIC

## 2017-04-04 MED ORDER — SODIUM CHLORIDE 0.9% FLUSH: 3.0000 mL | INTRAVENOUS | Status: DC | PRN

## 2017-04-04 MED ORDER — POTASSIUM CHLORIDE 10 MEQ/50ML IV SOLN
10.0000 meq | INTRAVENOUS | Status: DC | PRN
  Administered 2017-04-04: 10 meq via INTRAVENOUS
  Filled 2017-04-04 (×3): qty 50

## 2017-04-04 MED ORDER — NITROGLYCERIN IN D5W 200-5 MCG/ML-% IV SOLN: 0.0000 ug/min | INTRAVENOUS | Status: DC

## 2017-04-04 MED ORDER — 0.9 % SODIUM CHLORIDE (POUR BTL) OPTIME
TOPICAL | Status: DC | PRN
  Administered 2017-04-04: 6000 mL

## 2017-04-04 MED ORDER — SODIUM CHLORIDE 0.9 % IV SOLN
0.0000 ug/min | INTRAVENOUS | Status: DC
  Filled 2017-04-04: qty 2

## 2017-04-04 MED ORDER — TRAMADOL HCL 50 MG PO TABS
50.0000 mg | ORAL_TABLET | ORAL | Status: DC | PRN
  Administered 2017-04-06: 100 mg via ORAL
  Filled 2017-04-04: qty 2

## 2017-04-04 MED ORDER — SODIUM CHLORIDE 0.9 % IV SOLN
INTRAVENOUS | Status: DC
  Administered 2017-04-04: 20 mL/h via INTRAVENOUS

## 2017-04-04 MED ORDER — MIDAZOLAM HCL 10 MG/2ML IJ SOLN
INTRAMUSCULAR | Status: AC
  Filled 2017-04-04: qty 2

## 2017-04-04 MED ORDER — METOPROLOL TARTRATE 5 MG/5ML IV SOLN
2.5000 mg | INTRAVENOUS | Status: DC | PRN
  Administered 2017-04-04 – 2017-04-05 (×2): 2.5 mg via INTRAVENOUS

## 2017-04-04 MED ORDER — POTASSIUM CHLORIDE 10 MEQ/50ML IV SOLN
10.0000 meq | INTRAVENOUS | Status: AC
  Administered 2017-04-04 (×2): 10 meq via INTRAVENOUS

## 2017-04-04 MED ORDER — SODIUM CHLORIDE 0.9% FLUSH
3.0000 mL | Freq: Two times a day (BID) | INTRAVENOUS | Status: DC
  Administered 2017-04-05: 3 mL via INTRAVENOUS

## 2017-04-04 MED ORDER — SODIUM CHLORIDE 0.9 % IV SOLN
INTRAVENOUS | Status: DC
  Filled 2017-04-04: qty 1

## 2017-04-04 MED ORDER — ONDANSETRON HCL 4 MG/2ML IJ SOLN
INTRAMUSCULAR | Status: AC
  Filled 2017-04-04: qty 2

## 2017-04-04 MED ORDER — ROCURONIUM BROMIDE 10 MG/ML (PF) SYRINGE
PREFILLED_SYRINGE | INTRAVENOUS | Status: DC | PRN
  Administered 2017-04-04 (×2): 20 mg via INTRAVENOUS
  Administered 2017-04-04: 60 mg via INTRAVENOUS
  Administered 2017-04-04: 20 mg via INTRAVENOUS

## 2017-04-04 MED ORDER — ORAL CARE MOUTH RINSE: 15.0000 mL | Freq: Two times a day (BID) | OROMUCOSAL | Status: DC

## 2017-04-04 MED ORDER — VANCOMYCIN HCL IN DEXTROSE 1-5 GM/200ML-% IV SOLN
1000.0000 mg | Freq: Once | INTRAVENOUS | Status: AC
  Administered 2017-04-04: 1000 mg via INTRAVENOUS
  Filled 2017-04-04: qty 200

## 2017-04-04 MED ORDER — HEMOSTATIC AGENTS (NO CHARGE) OPTIME
TOPICAL | Status: DC | PRN
  Administered 2017-04-04: 1 via TOPICAL

## 2017-04-04 MED ORDER — METOPROLOL TARTRATE 12.5 MG HALF TABLET
12.5000 mg | ORAL_TABLET | Freq: Two times a day (BID) | ORAL | Status: DC
  Administered 2017-04-04: 12.5 mg via ORAL
  Filled 2017-04-04: qty 1

## 2017-04-04 MED ORDER — PHENYLEPHRINE 40 MCG/ML (10ML) SYRINGE FOR IV PUSH (FOR BLOOD PRESSURE SUPPORT)
PREFILLED_SYRINGE | INTRAVENOUS | Status: AC
  Filled 2017-04-04: qty 10

## 2017-04-04 MED ORDER — SODIUM CHLORIDE 0.9% FLUSH
10.0000 mL | Freq: Two times a day (BID) | INTRAVENOUS | Status: DC
  Administered 2017-04-04: 10 mL

## 2017-04-04 MED ORDER — ARTIFICIAL TEARS OPHTHALMIC OINT
TOPICAL_OINTMENT | OPHTHALMIC | Status: AC
  Filled 2017-04-04: qty 3.5

## 2017-04-04 MED ORDER — PROPOFOL 10 MG/ML IV BOLUS
INTRAVENOUS | Status: DC | PRN
  Administered 2017-04-04: 50 mg via INTRAVENOUS

## 2017-04-04 MED ORDER — MAGNESIUM SULFATE 4 GM/100ML IV SOLN
4.0000 g | Freq: Once | INTRAVENOUS | Status: AC
  Administered 2017-04-04: 4 g via INTRAVENOUS
  Filled 2017-04-04: qty 100

## 2017-04-04 MED ORDER — SODIUM CHLORIDE 0.9 % IV SOLN: 250.0000 mL | INTRAVENOUS | Status: DC

## 2017-04-04 MED ORDER — CALCIUM CHLORIDE 10 % IV SOLN
INTRAVENOUS | Status: DC | PRN
  Administered 2017-04-04 (×2): 100 mg via INTRAVENOUS

## 2017-04-04 MED ORDER — ACETAMINOPHEN 160 MG/5ML PO SOLN: 650.0000 mg | Freq: Once | ORAL | Status: AC

## 2017-04-04 MED ORDER — EPHEDRINE 5 MG/ML INJ
INTRAVENOUS | Status: AC
  Filled 2017-04-04: qty 10

## 2017-04-04 MED ORDER — DOCUSATE SODIUM 100 MG PO CAPS
200.0000 mg | ORAL_CAPSULE | Freq: Every day | ORAL | Status: DC
  Administered 2017-04-05 – 2017-04-08 (×4): 200 mg via ORAL
  Filled 2017-04-04 (×4): qty 2

## 2017-04-04 MED FILL — Heparin Sodium (Porcine) Inj 1000 Unit/ML: INTRAMUSCULAR | Qty: 30 | Status: AC

## 2017-04-04 MED FILL — Magnesium Sulfate Inj 50%: INTRAMUSCULAR | Qty: 10 | Status: AC

## 2017-04-04 MED FILL — Potassium Chloride Inj 2 mEq/ML: INTRAVENOUS | Qty: 40 | Status: AC

## 2017-04-04 SURGICAL SUPPLY — 91 items
BAG DECANTER FOR FLEXI CONT (MISCELLANEOUS) ×4 IMPLANT
BANDAGE ACE 4X5 VEL STRL LF (GAUZE/BANDAGES/DRESSINGS) IMPLANT
BANDAGE ACE 6X5 VEL STRL LF (GAUZE/BANDAGES/DRESSINGS) IMPLANT
BASKET HEART  (ORDER IN 25'S) (MISCELLANEOUS) ×1
BASKET HEART (ORDER IN 25'S) (MISCELLANEOUS) ×1
BASKET HEART (ORDER IN 25S) (MISCELLANEOUS) ×2 IMPLANT
BLADE STERNUM SYSTEM 6 (BLADE) ×4 IMPLANT
BLOWER MISTER CAL-MED (MISCELLANEOUS) ×4 IMPLANT
BNDG GAUZE ELAST 4 BULKY (GAUZE/BANDAGES/DRESSINGS) IMPLANT
CANISTER SUCT 3000ML PPV (MISCELLANEOUS) ×4 IMPLANT
CANNULA EZ GLIDE AORTIC 21FR (CANNULA) ×4 IMPLANT
CATH CPB KIT HENDRICKSON (MISCELLANEOUS) ×4 IMPLANT
CATH ROBINSON RED A/P 18FR (CATHETERS) ×8 IMPLANT
CATH THORACIC 36FR (CATHETERS) ×4 IMPLANT
CATH THORACIC 36FR RT ANG (CATHETERS) ×4 IMPLANT
CLIP VESOCCLUDE MED 24/CT (CLIP) IMPLANT
CLIP VESOCCLUDE SM WIDE 24/CT (CLIP) IMPLANT
CRADLE DONUT ADULT HEAD (MISCELLANEOUS) ×4 IMPLANT
DRAPE CARDIOVASCULAR INCISE (DRAPES) ×2
DRAPE SLUSH/WARMER DISC (DRAPES) ×4 IMPLANT
DRAPE SRG 135X102X78XABS (DRAPES) ×2 IMPLANT
DRSG COVADERM 4X14 (GAUZE/BANDAGES/DRESSINGS) ×4 IMPLANT
DRSG COVADERM 4X8 (GAUZE/BANDAGES/DRESSINGS) ×4 IMPLANT
ELECT CAUTERY BLADE 6.4 (BLADE) ×4 IMPLANT
ELECT REM PT RETURN 9FT ADLT (ELECTROSURGICAL) ×8
ELECTRODE REM PT RTRN 9FT ADLT (ELECTROSURGICAL) ×4 IMPLANT
FELT TEFLON 1X6 (MISCELLANEOUS) ×8 IMPLANT
GAUZE SPONGE 4X4 12PLY STRL (GAUZE/BANDAGES/DRESSINGS) ×4 IMPLANT
GAUZE SPONGE 4X4 12PLY STRL LF (GAUZE/BANDAGES/DRESSINGS) ×4 IMPLANT
GLOVE BIO SURGEON STRL SZ8 (GLOVE) ×4 IMPLANT
GLOVE BIOGEL PI IND STRL 6 (GLOVE) ×6 IMPLANT
GLOVE BIOGEL PI IND STRL 6.5 (GLOVE) ×6 IMPLANT
GLOVE BIOGEL PI INDICATOR 6 (GLOVE) ×6
GLOVE BIOGEL PI INDICATOR 6.5 (GLOVE) ×6
GLOVE SURG SIGNA 7.5 PF LTX (GLOVE) ×12 IMPLANT
GOWN STRL REUS W/ TWL LRG LVL3 (GOWN DISPOSABLE) ×18 IMPLANT
GOWN STRL REUS W/ TWL XL LVL3 (GOWN DISPOSABLE) ×4 IMPLANT
GOWN STRL REUS W/TWL LRG LVL3 (GOWN DISPOSABLE) ×18
GOWN STRL REUS W/TWL XL LVL3 (GOWN DISPOSABLE) ×4
HEMOSTAT POWDER SURGIFOAM 1G (HEMOSTASIS) ×12 IMPLANT
HEMOSTAT SURGICEL 2X14 (HEMOSTASIS) ×4 IMPLANT
INSERT FOGARTY XLG (MISCELLANEOUS) ×4 IMPLANT
KIT BASIN OR (CUSTOM PROCEDURE TRAY) ×4 IMPLANT
KIT ROOM TURNOVER OR (KITS) ×4 IMPLANT
KIT SUCTION CATH 14FR (SUCTIONS) ×8 IMPLANT
KIT VASOVIEW HEMOPRO VH 3000 (KITS) ×4 IMPLANT
MARKER GRAFT CORONARY BYPASS (MISCELLANEOUS) ×12 IMPLANT
NS IRRIG 1000ML POUR BTL (IV SOLUTION) ×20 IMPLANT
OFFPUMP STABILIZER SUV (MISCELLANEOUS) ×4 IMPLANT
PACK E OPEN HEART (SUTURE) ×4 IMPLANT
PACK OPEN HEART (CUSTOM PROCEDURE TRAY) ×4 IMPLANT
PAD ARMBOARD 7.5X6 YLW CONV (MISCELLANEOUS) ×8 IMPLANT
PAD ELECT DEFIB RADIOL ZOLL (MISCELLANEOUS) ×4 IMPLANT
PENCIL BUTTON HOLSTER BLD 10FT (ELECTRODE) ×4 IMPLANT
POSITIONER ACROBAT-I OFFPUMP (MISCELLANEOUS) ×4 IMPLANT
PUNCH AORTIC ROTATE 4.0MM (MISCELLANEOUS) IMPLANT
PUNCH AORTIC ROTATE 4.5MM 8IN (MISCELLANEOUS) IMPLANT
PUNCH AORTIC ROTATE 5MM 8IN (MISCELLANEOUS) IMPLANT
SET CARDIOPLEGIA MPS 5001102 (MISCELLANEOUS) ×4 IMPLANT
SUT BONE WAX W31G (SUTURE) ×4 IMPLANT
SUT MNCRL AB 4-0 PS2 18 (SUTURE) IMPLANT
SUT PROLENE 3 0 SH DA (SUTURE) ×4 IMPLANT
SUT PROLENE 4 0 RB 1 (SUTURE)
SUT PROLENE 4 0 SH DA (SUTURE) IMPLANT
SUT PROLENE 4-0 RB1 .5 CRCL 36 (SUTURE) IMPLANT
SUT PROLENE 5 0 C 1 36 (SUTURE) ×4 IMPLANT
SUT PROLENE 6 0 C 1 30 (SUTURE) ×8 IMPLANT
SUT PROLENE 7 0 BV1 MDA (SUTURE) ×4 IMPLANT
SUT PROLENE 8 0 BV175 6 (SUTURE) IMPLANT
SUT STEEL 6MS V (SUTURE) ×4 IMPLANT
SUT STEEL STERNAL CCS#1 18IN (SUTURE) IMPLANT
SUT STEEL SZ 6 DBL 3X14 BALL (SUTURE) ×4 IMPLANT
SUT VIC AB 1 CTX 36 (SUTURE) ×4
SUT VIC AB 1 CTX36XBRD ANBCTR (SUTURE) ×4 IMPLANT
SUT VIC AB 2-0 CT1 27 (SUTURE)
SUT VIC AB 2-0 CT1 TAPERPNT 27 (SUTURE) IMPLANT
SUT VIC AB 2-0 CTX 27 (SUTURE) IMPLANT
SUT VIC AB 3-0 SH 27 (SUTURE)
SUT VIC AB 3-0 SH 27X BRD (SUTURE) IMPLANT
SUT VIC AB 3-0 X1 27 (SUTURE) IMPLANT
SUT VICRYL 4-0 PS2 18IN ABS (SUTURE) IMPLANT
SYSTEM SAHARA CHEST DRAIN ATS (WOUND CARE) ×4 IMPLANT
TAPE CLOTH SURG 6X10 WHT LF (GAUZE/BANDAGES/DRESSINGS) ×4 IMPLANT
TAPE PAPER 3X10 WHT MICROPORE (GAUZE/BANDAGES/DRESSINGS) ×4 IMPLANT
TOWEL GREEN STERILE (TOWEL DISPOSABLE) ×4 IMPLANT
TOWEL GREEN STERILE FF (TOWEL DISPOSABLE) ×4 IMPLANT
TRAY FOLEY SILVER 16FR TEMP (SET/KITS/TRAYS/PACK) ×4 IMPLANT
TUBE FEEDING 8FR 16IN STR KANG (MISCELLANEOUS) ×8 IMPLANT
TUBING INSUFFLATION (TUBING) ×4 IMPLANT
UNDERPAD 30X30 (UNDERPADS AND DIAPERS) ×4 IMPLANT
WATER STERILE IRR 1000ML POUR (IV SOLUTION) ×8 IMPLANT

## 2017-04-04 NOTE — Anesthesia Procedure Notes (Signed)
Procedure Name: Intubation Date/Time: 04/04/2017 8:40 AM Performed by: Wilburn Cornelia, CRNA Pre-anesthesia Checklist: Patient identified, Emergency Drugs available, Suction available, Patient being monitored and Timeout performed Patient Re-evaluated:Patient Re-evaluated prior to induction Oxygen Delivery Method: Circle system utilized Preoxygenation: Pre-oxygenation with 100% oxygen Induction Type: IV induction Ventilation: Mask ventilation without difficulty Laryngoscope Size: Mac and 4 Grade View: Grade I Tube type: Oral Tube size: 8.0 mm Number of attempts: 1 Airway Equipment and Method: Stylet Placement Confirmation: ETT inserted through vocal cords under direct vision,  positive ETCO2,  CO2 detector and breath sounds checked- equal and bilateral Secured at: 23 cm Tube secured with: Tape Dental Injury: Teeth and Oropharynx as per pre-operative assessment

## 2017-04-04 NOTE — Anesthesia Postprocedure Evaluation (Signed)
Anesthesia Post Note  Patient: Kelly Hardy  Procedure(s) Performed: CORONARY ARTERY BYPASS GRAFTING (CABG), times one, using left internal mammary artery. Off pump (N/A Chest) TRANSESOPHAGEAL ECHOCARDIOGRAM (TEE) (N/A )     Patient location during evaluation: SICU Anesthesia Type: General Level of consciousness: sedated Pain management: pain level controlled Vital Signs Assessment: post-procedure vital signs reviewed and stable Respiratory status: patient remains intubated per anesthesia plan Cardiovascular status: stable Postop Assessment: no apparent nausea or vomiting Anesthetic complications: no    Last Vitals:  Vitals:   04/04/17 0700 04/04/17 1150  BP: (!) 146/64 (!) 115/51  Pulse: 76 (!) 52  Resp: 18 12  Temp: 36.7 C   SpO2: 100% 100%    Last Pain:  Vitals:   04/04/17 0700  TempSrc: Oral                 Tiajuana Amass

## 2017-04-04 NOTE — Procedures (Signed)
Extubation Procedure Note  Patient Details:   Name: Kelly Hardy DOB: Oct 15, 1960 MRN: [id number]   Airway Documentation:     Evaluation  O2 sats: stable throughout Complications: No apparent complications Patient did tolerate procedure well. Bilateral Breath Sounds: Clear   Yes   Positive cuff leak, VC 1320, NIF - 22.  Pt placed on [place] 4 L with humidity, tolerating well at this time. No stridor noted.  Pt able to reach 625 mL using incentive spirometer.  [place] Amber Maddisen Vought 04/04/2017, 4:21 PM

## 2017-04-04 NOTE — Anesthesia Procedure Notes (Signed)
Central Venous Catheter Insertion Performed by: Suzette Battiest, MD, anesthesiologist Start/End1/05/2017 7:50 AM, 04/04/2017 8:00 AM Patient location: Pre-op. Preanesthetic checklist: patient identified, IV checked, site marked, risks and benefits discussed, surgical consent, monitors and equipment checked, pre-op evaluation, timeout performed and anesthesia consent Position: Trendelenburg Lidocaine 1% used for infiltration and patient sedated Hand hygiene performed , maximum sterile barriers used  and Seldinger technique used Catheter size: 9 Fr Total catheter length 10. Central line and PA cath was placed.MAC introducer Swan type:thermodilution PA Cath depth:50 Procedure performed using ultrasound guided technique. Ultrasound Notes:anatomy identified, needle tip was noted to be adjacent to the nerve/plexus identified, no ultrasound evidence of intravascular and/or intraneural injection and image(s) printed for medical record Attempts: 1 Following insertion, line sutured, dressing applied and Biopatch. Post procedure assessment: blood return through all ports, free fluid flow and no air  Patient tolerated the procedure well with no immediate complications.

## 2017-04-04 NOTE — Anesthesia Procedure Notes (Signed)
Arterial Line Insertion Start/End1/05/2017 7:45 AM, 04/04/2017 7:50 AM Performed by: Wilburn Cornelia, CRNA, CRNA  Patient location: Pre-op. Preanesthetic checklist: patient identified, IV checked, risks and benefits discussed, surgical consent, monitors and equipment checked, pre-op evaluation and timeout performed Lidocaine 1% used for infiltration and patient sedated Left, radial was placed Catheter size: 20 G Hand hygiene performed  and maximum sterile barriers used  Allen's test indicative of satisfactory collateral circulation Attempts: 1 Procedure performed without using ultrasound guided technique. Following insertion, Biopatch. Post procedure assessment: normal  Patient tolerated the procedure well with no immediate complications.

## 2017-04-04 NOTE — Brief Op Note (Signed)
04/04/2017  5:37 PM  PATIENT:  Kelly Hardy  57 y.o. male  PRE-OPERATIVE DIAGNOSIS:  Coronary Artery Disease  POST-OPERATIVE DIAGNOSIS:  Coronary Artery Disease  PROCEDURE:  Procedure(s): CORONARY ARTERY BYPASS GRAFTING (CABG), times one, using left internal mammary artery. Off pump (N/A) TRANSESOPHAGEAL ECHOCARDIOGRAM (TEE) (N/A)  SURGEON:  Surgeon(s) and Role:    * Melrose Nakayama, MD - Primary  PHYSICIAN ASSISTANT: Jadene Pierini, PA-C  ASSISTANTS: Dineen Kid, RNFA   ANESTHESIA:   general  EBL:  100 mL   BLOOD ADMINISTERED:none  DRAINS: - Chest Tube(s) in the Mediastinum and left pleura   LOCAL MEDICATIONS USED:  NONE  SPECIMEN:  No Specimen  DISPOSITION OF SPECIMEN:  N/A  COUNTS:  YES  TOURNIQUET:  * No tourniquets in log *  DICTATION: .Other Dictation: Dictation Number -  PLAN OF CARE: Admit to inpatient   PATIENT DISPOSITION:  ICU - intubated and hemodynamically stable.   Delay start of Pharmacological VTE agent (>24hrs) due to surgical blood loss or risk of bleeding: no

## 2017-04-04 NOTE — Progress Notes (Signed)
Initial CXR showed a left pneumothorax- pleural tube in place The swan was coiled in Pa- pulled back ~5 cm  Remo Lipps C. Roxan Hockey, MD [facility] [phone]

## 2017-04-04 NOTE — Transfer of Care (Signed)
Immediate Anesthesia Transfer of Care Note  Patient: Kelly Hardy  Procedure(s) Performed: CORONARY ARTERY BYPASS GRAFTING (CABG), times one, using left internal mammary artery. Off pump (N/A Chest) TRANSESOPHAGEAL ECHOCARDIOGRAM (TEE) (N/A )  Patient Location: SICU  Anesthesia Type:General  Level of Consciousness: Patient remains intubated per anesthesia plan  Airway & Oxygen Therapy: Patient remains intubated per anesthesia plan and Patient placed on Ventilator (see vital sign flow sheet for setting)  Post-op Assessment: Report given to RN and Post -op Vital signs reviewed and stable  Post vital signs: Reviewed and stable  Last Vitals:  Vitals:   04/04/17 0700  BP: (!) 146/64  Pulse: 76  Resp: 18  Temp: 36.7 C  SpO2: 100%    Last Pain:  Vitals:   04/04/17 0700  TempSrc: Oral      Patients Stated Pain Goal: 3 (19/50/93 2671)  Complications: No apparent anesthesia complications

## 2017-04-04 NOTE — Interval H&P Note (Signed)
History and Physical Interval Note:  04/04/2017 8:05 AM  Kelly Hardy  has presented today for surgery, with the diagnosis of Coronary Artery Disease  The various methods of treatment have been discussed with the patient and family. After consideration of risks, benefits and other options for treatment, the patient has consented to  Procedure(s): CORONARY ARTERY BYPASS GRAFTING (CABG) (N/A) TRANSESOPHAGEAL ECHOCARDIOGRAM (TEE) (N/A) as a surgical intervention .  The patient's history has been reviewed, patient examined, no change in status, stable for surgery.  I have reviewed the patient's chart and labs.  Questions were answered to the patient's satisfaction.     Melrose Nakayama

## 2017-04-04 NOTE — Op Note (Signed)
Kelly Hardy, Kelly Hardy               ACCOUNT NO.:  [id number]  MEDICAL RECORD NO.:  [id number]  LOCATION:                                 FACILITY:  PHYSICIAN:  Revonda Standard. Roxan Hockey, M.D. DATE OF BIRTH:  DATE OF PROCEDURE:  04/04/2017 DATE OF DISCHARGE:                              OPERATIVE REPORT   PREOPERATIVE DIAGNOSIS:  Severe single-vessel coronary artery disease with progressive angina.  POSTOPERATIVE DIAGNOSIS:  Severe single-vessel coronary artery disease with progressive angina.  PROCEDURES:   Median sternotomy Off-pump coronary artery bypass grafting x 1  Left internal mammary artery to LAD.  SURGEON:  Revonda Standard. Roxan Hockey, MD.  ASSISTANTS:  Jadene Pierini, PA-C.  ANESTHESIA:  General.  FINDINGS:  Mammary and LAD both good quality vessels.  Transesophageal echocardiography showed preserved left ventricular wall motion with mild MR and trace aortic insufficiency.  CLINICAL NOTE:  Kelly Hardy is a 57 year old man with a history of known coronary artery disease with a previous stent to his right coronary.  He has type 1 diabetes, hyperlipidemia, hypertension as well.  He underwent cardiac catheterization and was found to have severe single-vessel coronary artery disease involving the proximal LAD.  This was not amenable to percutaneous intervention.  He was referred for coronary artery bypass grafting.  The patient was advised to have coronary artery bypass grafting with left mammary to the LAD.  The indications, risks, benefits, and alternatives were discussed in detail with the patient. He understood and accepted the risks and agreed to proceed.  OPERATIVE NOTE:  Kelly Hardy was brought to the preoperative holding area on April 04, 2017.  Anesthesia placed a Swan-Ganz catheter and an arterial blood pressure monitoring line.  He was taken to the operating room, anesthetized, and intubated.  Intravenous antibiotics were administered. The chest, abdomen, and legs were  prepped and draped in the usual sterile fashion.  Transesophageal echocardiography was performed. It revealed normal left ventricular wall motion.  There were mild MR and trace aortic insufficiency.  After performing a time-out, a median sternotomy was performed.  The left internal mammary artery was harvested using standard technique. Heparin was administered prior to dividing the distal end of the mammary artery.  The mammary artery was a good quality vessel with excellent flow when divided distally.  The Maquet off-pump retractor was placed and gradually opened.  The pericardium was opened.  The ascending aorta was inspected.  There was no evidence of atherosclerotic disease.  The LAD was visible in its midportion.  The left mammary was brought through an incision in the pericardium and reached the site for anastomosis easily and the decision was made to proceed with off-pump grafting.  The patient was placed in Trendelenburg position.  The suction cup was placed on the apex of the heart and the apex was brought anterior and to the right exposing the LAD.  The stabilizing device was placed at the site selected for anastomosis on the LAD.  Silastic tapes were placed proximally and distally.  An arteriotomy was made in the LAD.  Gentle traction was applied to the silastic tapes.  The arteriotomy was lengthened proximally and distally. The left mammary then was anastomosed to the  LAD with a running 8-0 Prolene suture.  At the completion of anastomosis, the silastic tapes were removed and the bulldog clamp was removed from the left mammary artery.  There was good hemostasis at the anastomosis.  The anastomosis was probed proximally and distally prior to tying the suture.  Mammary pedicle was tacked to the epicardial surface of the heart with 6-0 Prolene sutures.  The stabilizer was removed.  The suction cup was removed from the apex, and the heart was allowed to rest in its  normal location.  There was no tension on the artery or anastomosis. Epicardial pacing wires were placed on the right ventricle and right atrium.  It should be noted the patient remained hemodynamically stable without any arrhythmias or significant ST changes during the anastomosis.  The chest was copiously irrigated with warm saline. Hemostasis was achieved.  The pericardium was reapproximated with interrupted 3-0 silk sutures.  It came together easily without tension. Left pleural and mediastinal chest tubes were placed through separate subcostal incisions and secured with #1 silk sutures.  The sternum was closed with a combination of single and double heavy gauge stainless steel wires.  The pectoralis fascia, subcutaneous tissue, and skin were closed in standard fashion.  All sponge, needle, and instrument counts were correct at the end of the procedure.  The patient was taken from the operating room to the Surgical Intensive Care Unit intubated and in good condition.     Revonda Standard Roxan Hockey, M.D.     SCH/MEDQ  D:  04/04/2017  T:  04/04/2017  Job:  [id number]

## 2017-04-04 NOTE — Anesthesia Procedure Notes (Signed)
Central Venous Catheter Insertion Performed by: Suzette Battiest, MD, anesthesiologist Start/End1/05/2017 7:50 AM, 04/04/2017 8:00 AM Patient location: Pre-op. Preanesthetic checklist: patient identified, IV checked, site marked, risks and benefits discussed, surgical consent, monitors and equipment checked, pre-op evaluation, timeout performed and anesthesia consent Hand hygiene performed  and maximum sterile barriers used  PA cath was placed.Swan type:thermodilution Procedure performed without using ultrasound guided technique. Attempts: 1 Patient tolerated the procedure well with no immediate complications.

## 2017-04-04 NOTE — Brief Op Note (Signed)
04/04/2017  9:06 AM  PATIENT:  Kelly Hardy  57 y.o. male  PRE-OPERATIVE DIAGNOSIS:  Coronary Artery Disease  POST-OPERATIVE DIAGNOSIS:  Coronary Artery Disease  PROCEDURE:  Procedure(s): CORONARY ARTERY BYPASS GRAFTING (CABG), times one, using left internal mammary artery. Off pump (N/A) TRANSESOPHAGEAL ECHOCARDIOGRAM (TEE) (N/A)  SURGEON:  Surgeon(s) and Role:    * Melrose Nakayama, MD - Primary  PHYSICIAN ASSISTANT: WAYNE GOLD PA-C  ANESTHESIA:   general  EBL:  100 mL   BLOOD ADMINISTERED:none  DRAINS: MEDIASTINAL AND PLEURAL CHEST TUBES   LOCAL MEDICATIONS USED:  NONE  SPECIMEN:  No Specimen  DISPOSITION OF SPECIMEN:  N/A  COUNTS:  YES  TOURNIQUET:  * No tourniquets in log *  DICTATION: .Other Dictation: Dictation Number [id number]  PLAN OF CARE: Admit to inpatient   PATIENT DISPOSITION:  ICU - intubated and hemodynamically stable.   Delay start of Pharmacological VTE agent (>24hrs) due to surgical blood loss or risk of bleeding: yes

## 2017-04-04 NOTE — Progress Notes (Signed)
Patient ID: Kelly Hardy, male   DOB: 1960/05/15, 57 y.o.   MRN: [id number]  TCTS Evening Rounds:   Hemodynamically stable  CI = 3.8  Extubated in 4 hrs  Urine output good  CT output low  CBC    Component Value Date/Time   WBC 10.2 04/04/2017 1801   RBC 3.38 (L) 04/04/2017 1801   HGB 9.9 (L) 04/04/2017 1807   HCT 29.0 (L) 04/04/2017 1807   PLT 180 04/04/2017 1801   MCV 91.1 04/04/2017 1801   MCH 30.5 04/04/2017 1801   MCHC 33.4 04/04/2017 1801   RDW 12.9 04/04/2017 1801   LYMPHSABS 1.4 02/09/2016 1357   MONOABS 0.3 02/09/2016 1357   EOSABS 0.1 02/09/2016 1357   BASOSABS 0.0 02/09/2016 1357     BMET    Component Value Date/Time   NA 140 04/04/2017 1807   K 4.4 04/04/2017 1807   CL 105 04/04/2017 1807   CO2 24 04/02/2017 0838   GLUCOSE 132 (H) 04/04/2017 1807   BUN 16 04/04/2017 1807   CREATININE 0.50 (L) 04/04/2017 1807   CALCIUM 9.3 04/02/2017 0838   GFRNONAA >60 04/02/2017 0838   GFRAA >60 04/02/2017 0838     A/P:  Stable postop course. Continue current plans

## 2017-04-05 ENCOUNTER — Other Ambulatory Visit: Payer: Self-pay

## 2017-04-05 ENCOUNTER — Inpatient Hospital Stay (HOSPITAL_COMMUNITY): Payer: Commercial Managed Care - PPO

## 2017-04-05 ENCOUNTER — Encounter (HOSPITAL_COMMUNITY): Payer: Self-pay | Admitting: Thoracic Surgery (Cardiothoracic Vascular Surgery)

## 2017-04-05 LAB — GLUCOSE, CAPILLARY
GLUCOSE-CAPILLARY: 102 mg/dL — AB (ref 65–99)
GLUCOSE-CAPILLARY: 121 mg/dL — AB (ref 65–99)
GLUCOSE-CAPILLARY: 121 mg/dL — AB (ref 65–99)
GLUCOSE-CAPILLARY: 96 mg/dL (ref 65–99)
GLUCOSE-CAPILLARY: 167 mg/dL — AB (ref 65–99)
Glucose-Capillary: 128 mg/dL — ABNORMAL HIGH (ref 65–99)
Glucose-Capillary: 129 mg/dL — ABNORMAL HIGH (ref 65–99)
Glucose-Capillary: 135 mg/dL — ABNORMAL HIGH (ref 65–99)
Glucose-Capillary: 139 mg/dL — ABNORMAL HIGH (ref 65–99)
Glucose-Capillary: 140 mg/dL — ABNORMAL HIGH (ref 65–99)
Glucose-Capillary: 142 mg/dL — ABNORMAL HIGH (ref 65–99)
Glucose-Capillary: 143 mg/dL — ABNORMAL HIGH (ref 65–99)
Glucose-Capillary: 87 mg/dL (ref 65–99)
Glucose-Capillary: 95 mg/dL (ref 65–99)
Glucose-Capillary: 99 mg/dL (ref 65–99)

## 2017-04-05 LAB — CBC
HEMATOCRIT: 32 % — AB (ref 39.0–52.0)
HEMATOCRIT: 31.3 % — AB (ref 39.0–52.0)
Hemoglobin: 10.2 g/dL — ABNORMAL LOW (ref 13.0–17.0)
Hemoglobin: 10.1 g/dL — ABNORMAL LOW (ref 13.0–17.0)
MCH: 28.7 pg (ref 26.0–34.0)
MCH: 29.8 pg (ref 26.0–34.0)
MCHC: 31.9 g/dL (ref 30.0–36.0)
MCHC: 32.3 g/dL (ref 30.0–36.0)
MCV: 89.9 fL (ref 78.0–100.0)
MCV: 92.3 fL (ref 78.0–100.0)
PLATELETS: 179 10*3/uL (ref 150–400)
Platelets: 162 10*3/uL (ref 150–400)
RBC: 3.56 MIL/uL — ABNORMAL LOW (ref 4.22–5.81)
RBC: 3.39 MIL/uL — ABNORMAL LOW (ref 4.22–5.81)
RDW: 12.9 % (ref 11.5–15.5)
RDW: 13.3 % (ref 11.5–15.5)
WBC: 12.8 10*3/uL — ABNORMAL HIGH (ref 4.0–10.5)
WBC: 9.8 10*3/uL (ref 4.0–10.5)

## 2017-04-05 LAB — CREATININE, SERUM
Creatinine, Ser: 0.74 mg/dL (ref 0.61–1.24)
GFR calc Af Amer: >60 mL/min (ref >60)

## 2017-04-05 LAB — POCT I-STAT, CHEM 8
BUN: 14 mg/dL (ref 6–20)
CREATININE: 0.6 mg/dL — AB (ref 0.61–1.24)
Calcium, Ion: 1.2 mmol/L (ref 1.15–1.40)
Chloride: 99 mmol/L — ABNORMAL LOW (ref 101–111)
GLUCOSE: 148 mg/dL — AB (ref 65–99)
HCT: 29 % — ABNORMAL LOW (ref 39.0–52.0)
HEMOGLOBIN: 9.9 g/dL — AB (ref 13.0–17.0)
POTASSIUM: 4.4 mmol/L (ref 3.5–5.1)
Sodium: 135 mmol/L (ref 135–145)
TCO2: 26 mmol/L (ref 22–32)

## 2017-04-05 LAB — MAGNESIUM
MAGNESIUM: 2.1 mg/dL (ref 1.7–2.4)
Magnesium: 2.3 mg/dL (ref 1.7–2.4)

## 2017-04-05 LAB — BASIC METABOLIC PANEL
Anion gap: 7 (ref 5–15)
BUN: 14 mg/dL (ref 6–20)
CO2: 21 mmol/L — AB (ref 22–32)
Calcium: 8.5 mg/dL — ABNORMAL LOW (ref 8.9–10.3)
Chloride: 107 mmol/L (ref 101–111)
Creatinine, Ser: 0.7 mg/dL (ref 0.61–1.24)
GLUCOSE: 141 mg/dL — AB (ref 65–99)
Potassium: 4.4 mmol/L (ref 3.5–5.1)
Sodium: 135 mmol/L (ref 135–145)

## 2017-04-05 MED ORDER — ENOXAPARIN SODIUM 40 MG/0.4ML ~~LOC~~ SOLN
40.0000 mg | Freq: Every day | SUBCUTANEOUS | Status: DC
  Administered 2017-04-05 – 2017-04-07 (×3): 40 mg via SUBCUTANEOUS
  Filled 2017-04-05 (×3): qty 0.4

## 2017-04-05 MED ORDER — METOPROLOL TARTRATE 25 MG PO TABS
25.0000 mg | ORAL_TABLET | Freq: Two times a day (BID) | ORAL | Status: DC
  Administered 2017-04-05 – 2017-04-08 (×6): 25 mg via ORAL
  Filled 2017-04-05 (×7): qty 1

## 2017-04-05 MED ORDER — INSULIN ASPART 100 UNIT/ML ~~LOC~~ SOLN
0.0000 [IU] | SUBCUTANEOUS | Status: DC
  Administered 2017-04-05 (×2): 1 [IU] via SUBCUTANEOUS
  Administered 2017-04-05: 2 [IU] via SUBCUTANEOUS
  Administered 2017-04-06: 1 [IU] via SUBCUTANEOUS

## 2017-04-05 MED ORDER — INSULIN ASPART PROT & ASPART (70-30 MIX) 100 UNIT/ML ~~LOC~~ SUSP
12.0000 [IU] | Freq: Two times a day (BID) | SUBCUTANEOUS | Status: DC
  Administered 2017-04-05 – 2017-04-07 (×5): 12 [IU] via SUBCUTANEOUS
  Filled 2017-04-05: qty 10

## 2017-04-05 NOTE — Progress Notes (Signed)
1 Day Post-Op Procedure(s) (LRB): CORONARY ARTERY BYPASS GRAFTING (CABG), times one, using left internal mammary artery. Off pump (N/A) TRANSESOPHAGEAL ECHOCARDIOGRAM (TEE) (N/A) Subjective: Some incisional pain  Objective: Vital signs in last 24 hours: Temp:  [93 F (33.9 C)-98.5 F (36.9 C)] 98.5 F (36.9 C) (01/03 0736) Pulse Rate:  [52-103] 81 (01/03 0800) Cardiac Rhythm: Normal sinus rhythm (01/03 0400) Resp:  [10-30] 14 (01/03 0800) BP: (98-132)/(49-73) 116/55 (01/03 0800) SpO2:  [93 %-100 %] 100 % (01/03 0800) Arterial Line BP: (103-191)/(40-76) 150/43 (01/03 0800) FiO2 (%):  [40 %-50 %] 40 % (01/02 1505) Weight:  [155 lb 13.8 oz (70.7 kg)] 155 lb 13.8 oz (70.7 kg) (01/03 0500)  Hemodynamic parameters for last 24 hours: PAP: (14-28)/(6-17) 23/7 CO:  [3.8 L/min-7.4 L/min] 6 L/min CI:  [2.1 L/min/m2-4 L/min/m2] 4 L/min/m2  Intake/Output from previous day: 01/02 0701 - 01/03 0700 In: 3061.4 [P.O.:90; I.V.:2603.4; IV Piggyback:350] Out: 1300 [Urine:890; Blood:100; Chest Tube:310] Intake/Output this shift: Total I/O In: -  Out: 85 [Urine:55; Chest Tube:30]  General appearance: alert, cooperative and no distress Neurologic: intact Heart: regular rate and rhythm Lungs: diminished breath sounds bibasilar no air leak  Lab Results: Recent Labs    04/04/17 1801 04/04/17 1807 04/05/17 0350  WBC 10.2  --  12.8*  HGB 10.3* 9.9* 10.2*  HCT 30.8* 29.0* 32.0*  PLT 180  --  179   BMET:  Recent Labs    04/02/17 0838  04/04/17 1807 04/05/17 0350  NA 135   < > 140 135  K 3.8   < > 4.4 4.4  CL 103   < > 105 107  CO2 24  --   --  21*  GLUCOSE 161*   < > 132* 141*  BUN 14   < > 16 14  CREATININE 0.81   < > 0.50* 0.70  CALCIUM 9.3  --   --  8.5*   < > = values in this interval not displayed.    PT/INR:  Recent Labs    04/04/17 1156  LABPROT 15.2  INR 1.21   ABG    Component Value Date/Time   PHART 7.277 (L) 04/04/2017 1702   HCO3 21.7 04/04/2017 1702   TCO2 23 04/04/2017 1807   ACIDBASEDEF 5.0 (H) 04/04/2017 1702   O2SAT 100.0 04/04/2017 1702   CBG (last 3)  Recent Labs    04/05/17 0503 04/05/17 0558 04/05/17 0702  GLUCAP 140* 143* 129*    Assessment/Plan: S/P Procedure(s) (LRB): CORONARY ARTERY BYPASS GRAFTING (CABG), times one, using left internal mammary artery. Off pump (N/A) TRANSESOPHAGEAL ECHOCARDIOGRAM (TEE) (N/A) -  CV- stable, continue ASA, restart brillinta  RESP- IS for atelectasis  Small pneumothorax on CXR- unchanged from yesterday- no air leak form CT RENAL- creatinine and lytes OK-   ENDO- Type 1 DM- restart 70/30 12 units BID, SSI Q4  Dc chest tubes  SCD + enoxaparin for DVT prophylaxis  Ambulate    LOS: 1 day    Melrose Nakayama 04/05/2017

## 2017-04-05 NOTE — Plan of Care (Signed)
  Clinical Measurements: Respiratory complications will improve 04/05/2017 1047 - Progressing by Boris Lown, RN Note MT and PT chest tubes removed this AM.    Activity: Risk for activity intolerance will decrease 04/05/2017 1047 - Progressing by Boris Lown, RN Note Will begin ambulating patient today.

## 2017-04-05 NOTE — Progress Notes (Signed)
      [address]       [place] [address]             [phone]      Feels better this evening  BP 104/64   Pulse 79   Temp 98 F (36.7 C) (Oral)   Resp 15   Ht 5\' 9"  (1.753 m)   Wt 155 lb 13.8 oz (70.7 kg)   SpO2 98%   BMI 23.02 kg/m   Intake/Output Summary (Last 24 hours) at 04/05/2017 1806 Last data filed at 04/05/2017 1800 Gross per 24 hour  Intake 2392.88 ml  Output 1230 ml  Net 1162.88 ml   Creatinine 0.60, Hct=32  Doing well POD #1  Remo Lipps C. Roxan Hockey, MD [facility] [phone]

## 2017-04-06 ENCOUNTER — Inpatient Hospital Stay (HOSPITAL_COMMUNITY): Payer: Commercial Managed Care - PPO

## 2017-04-06 LAB — BASIC METABOLIC PANEL
Anion gap: 7 (ref 5–15)
BUN: 10 mg/dL (ref 6–20)
CHLORIDE: 100 mmol/L — AB (ref 101–111)
CO2: 24 mmol/L (ref 22–32)
CREATININE: 0.77 mg/dL (ref 0.61–1.24)
Calcium: 8.4 mg/dL — ABNORMAL LOW (ref 8.9–10.3)
GFR calc non Af Amer: >60 mL/min (ref >60)
GLUCOSE: 142 mg/dL — AB (ref 65–99)
Potassium: 3.7 mmol/L (ref 3.5–5.1)
Sodium: 131 mmol/L — ABNORMAL LOW (ref 135–145)

## 2017-04-06 LAB — CBC
HCT: 30.5 % — ABNORMAL LOW (ref 39.0–52.0)
Hemoglobin: 9.8 g/dL — ABNORMAL LOW (ref 13.0–17.0)
MCH: 29.4 pg (ref 26.0–34.0)
MCHC: 32.1 g/dL (ref 30.0–36.0)
MCV: 91.6 fL (ref 78.0–100.0)
PLATELETS: 156 10*3/uL (ref 150–400)
RBC: 3.33 MIL/uL — AB (ref 4.22–5.81)
RDW: 13.1 % (ref 11.5–15.5)
WBC: 9.4 10*3/uL (ref 4.0–10.5)

## 2017-04-06 LAB — GLUCOSE, CAPILLARY
GLUCOSE-CAPILLARY: 125 mg/dL — AB (ref 65–99)
Glucose-Capillary: 126 mg/dL — ABNORMAL HIGH (ref 65–99)
Glucose-Capillary: 134 mg/dL — ABNORMAL HIGH (ref 65–99)
Glucose-Capillary: 142 mg/dL — ABNORMAL HIGH (ref 65–99)
Glucose-Capillary: 222 mg/dL — ABNORMAL HIGH (ref 65–99)

## 2017-04-06 MED ORDER — MOVING RIGHT ALONG BOOK
Freq: Once | Status: DC
  Filled 2017-04-06: qty 1

## 2017-04-06 MED ORDER — INSULIN ASPART 100 UNIT/ML ~~LOC~~ SOLN
0.0000 [IU] | Freq: Three times a day (TID) | SUBCUTANEOUS | Status: DC
  Administered 2017-04-06 (×3): 1 [IU] via SUBCUTANEOUS
  Administered 2017-04-07 (×2): 3 [IU] via SUBCUTANEOUS
  Administered 2017-04-07 – 2017-04-08 (×2): 2 [IU] via SUBCUTANEOUS

## 2017-04-06 MED ORDER — LOSARTAN POTASSIUM 25 MG PO TABS
25.0000 mg | ORAL_TABLET | Freq: Every day | ORAL | Status: DC
  Administered 2017-04-06 – 2017-04-08 (×3): 25 mg via ORAL
  Filled 2017-04-06 (×3): qty 1

## 2017-04-06 MED ORDER — POTASSIUM CHLORIDE CRYS ER 20 MEQ PO TBCR
20.0000 meq | EXTENDED_RELEASE_TABLET | Freq: Three times a day (TID) | ORAL | Status: AC
  Administered 2017-04-06 (×3): 20 meq via ORAL
  Filled 2017-04-06 (×3): qty 1

## 2017-04-06 MED ORDER — SODIUM CHLORIDE 0.9% FLUSH
3.0000 mL | Freq: Two times a day (BID) | INTRAVENOUS | Status: DC
  Administered 2017-04-06 – 2017-04-08 (×4): 3 mL via INTRAVENOUS

## 2017-04-06 MED ORDER — MAGNESIUM HYDROXIDE 400 MG/5ML PO SUSP: 30.0000 mL | Freq: Every day | ORAL | Status: DC | PRN

## 2017-04-06 MED ORDER — SODIUM CHLORIDE 0.9 % IV SOLN: 250.0000 mL | INTRAVENOUS | Status: DC | PRN

## 2017-04-06 MED ORDER — SODIUM CHLORIDE 0.9% FLUSH: 3.0000 mL | INTRAVENOUS | Status: DC | PRN

## 2017-04-06 MED ORDER — ALUM & MAG HYDROXIDE-SIMETH 200-200-20 MG/5ML PO SUSP
15.0000 mL | Freq: Four times a day (QID) | ORAL | Status: DC | PRN
  Administered 2017-04-06: 15 mL via ORAL
  Filled 2017-04-06: qty 30

## 2017-04-06 MED ORDER — ZOLPIDEM TARTRATE 5 MG PO TABS: 5.0000 mg | ORAL_TABLET | Freq: Every evening | ORAL | Status: DC | PRN

## 2017-04-06 MED FILL — Heparin Sodium (Porcine) Inj 1000 Unit/ML: INTRAMUSCULAR | Qty: 30 | Status: AC

## 2017-04-06 MED FILL — Sodium Chloride IV Soln 0.9%: INTRAVENOUS | Qty: 1000 | Status: AC

## 2017-04-06 NOTE — Progress Notes (Addendum)
2 Days Post-Op Procedure(s) (LRB): CORONARY ARTERY BYPASS GRAFTING (CABG), times one, using left internal mammary artery. Off pump (N/A) TRANSESOPHAGEAL ECHOCARDIOGRAM (TEE) (N/A) Subjective: Some soreness  Objective: Vital signs in last 24 hours: Temp:  [98 F (36.7 C)-98.7 F (37.1 C)] 98.3 F (36.8 C) (01/04 0400) Pulse Rate:  [74-97] 94 (01/04 0500) Cardiac Rhythm: Normal sinus rhythm (01/04 0400) Resp:  [11-25] 23 (01/04 0600) BP: (98-142)/(55-75) 142/69 (01/04 0600) SpO2:  [93 %-100 %] 93 % (01/04 0500) Arterial Line BP: (150-160)/(43-54) 160/54 (01/03 0900) Weight:  [155 lb 13.8 oz (70.7 kg)] 155 lb 13.8 oz (70.7 kg) (01/04 0500)  Hemodynamic parameters for last 24 hours:    Intake/Output from previous day: 01/03 0701 - 01/04 0700 In: 2181.4 [P.O.:1570; I.V.:561.4; IV Piggyback:50] Out: 2641 [Urine:1285; Chest Tube:50] Intake/Output this shift: No intake/output data recorded.  General appearance: alert, cooperative and no distress Neurologic: intact Heart: regular rate and rhythm Lungs: clear to auscultation bilaterally  Lab Results: Recent Labs    04/05/17 1707 04/06/17 0345  WBC 9.8 9.4  HGB 10.1* 9.8*  HCT 31.3* 30.5*  PLT 162 156   BMET:  Recent Labs    04/05/17 0350 04/05/17 1545 04/05/17 1707 04/06/17 0345  NA 135 135  --  131*  K 4.4 4.4  --  3.7  CL 107 99*  --  100*  CO2 21*  --   --  24  GLUCOSE 141* 148*  --  142*  BUN 14 14  --  10  CREATININE 0.70 0.60* 0.74 0.77  CALCIUM 8.5*  --   --  8.4*    PT/INR:  Recent Labs    04/04/17 1156  LABPROT 15.2  INR 1.21   ABG    Component Value Date/Time   PHART 7.277 (L) 04/04/2017 1702   HCO3 21.7 04/04/2017 1702   TCO2 26 04/05/2017 1545   ACIDBASEDEF 5.0 (H) 04/04/2017 1702   O2SAT 100.0 04/04/2017 1702   CBG (last 3)  Recent Labs    04/05/17 1940 04/05/17 2320 04/06/17 0342  GLUCAP 167* 142* 126*    Assessment/Plan: S/P Procedure(s) (LRB): CORONARY ARTERY BYPASS  GRAFTING (CABG), times one, using left internal mammary artery. Off pump (N/A) TRANSESOPHAGEAL ECHOCARDIOGRAM (TEE) (N/A) Plan for transfer to step-down: see transfer orders  CV- in SR, BP a little elevated- resume Cozaar  RESP- continue IS  Small left pneumothorax- stable no intervention needed  RENAL- creatinine OK  Hyponatremia- mild, follow  Supplement K  ENDO= type 1 DM- well controlled  Transfer to 4 E   LOS: 2 days    Kelly Hardy 04/06/2017

## 2017-04-06 NOTE — Progress Notes (Signed)
Foley removed at 0530 04/06/2017; Introducer removed at 0600 04/06/2017-pt on bedrest 30 min supine. No changes in hemodynamic status.

## 2017-04-06 NOTE — Discharge Summary (Signed)
Physician Discharge Summary  Patient ID: Kelly Hardy MRN: [id number] DOB/AGE: 1960/09/19 57 y.o.  Admit date: 04/04/2017 Discharge date: 04/08/2017  Admission Diagnoses:  Patient Active Problem List   Diagnosis Date Noted  . Unstable angina ([place]) 03/23/2017  . Coronary artery disease involving native coronary artery of native heart without angina pectoris 11/30/2016  . Dyslipidemia 11/30/2016  . 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 Family history of coronary artery disease in father 02/18/2016  . Hypertension   . Hyperlipidemia   . CAD S/P percutaneous coronary angioplasty 02/10/2016  . Exertional angina ([facility])   . Abnormal cardiovascular stress test   . Fatigue 02/01/2016  . Chest pain 02/01/2016  . Abnormal EKG 02/01/2016   Discharge Diagnoses:   Patient Active Problem List   Diagnosis Date Noted  . S/P CABG x 1 04/04/2017  . Unstable angina ([place]) 03/23/2017  . Coronary artery disease involving native coronary artery of native heart without angina pectoris 11/30/2016  . Dyslipidemia 11/30/2016  . 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 Family history of coronary artery disease in father 02/18/2016  . Hypertension   . Hyperlipidemia   . CAD S/P percutaneous coronary angioplasty 02/10/2016  . Exertional angina ([facility])   . Abnormal cardiovascular stress test   . Fatigue 02/01/2016  . Chest pain 02/01/2016  . Abnormal EKG 02/01/2016   Discharged Condition: good  History of Present Illness:  Kelly Hardy is a 57 yo male with known history of CAD, S/P PCI with stent to his RCA performed in 2017.  He also has a history of Type 1 DM, Hypertension, Hyperlipidemia, and palpitations.  He was in his usual state of health until a few weeks ago, when he developed exertional chest discomfort.  He follows with Kelly Hardy at which time he admitted he experiences the discomfort with exertion, mainly when carrying boxes at work.  If he  stops the pain resolves.  It was felt with his underlying CAD that he should have repeat cardiac catheterization. This was performed on 03/23/2017 and revealed obstructive CAD of his LAD.  It was felt due to the location of the lesion he would benefit from coronary bypass grafting.  The risks and benefits of the procedure were explained to the patient and he was agreeable to proceed.  He was evaluated by Kelly Hardy who was agreeable the patient would benefit from bypass surgery.  However, being patient was on Brilinta he would require a wash out prior to proceeding.  The risks and benefits of the surgery were explained to the patient and he was agreeable to proceed. He was discharge home prior to surgery.  Hospital Course:   Kelly Hardy presented to [facility] on 04/04/2016.  He underwent CABG x 1 utilizing LIMA to LAD.  He tolerated the procedure without difficulty and was taken to the SICU in stable condition.  He was extubated the evening of surgery.  During his stay in the SICU the patient's chest tubes and arterial lines were removed without difficulty.  He was restarted on Brilinta for his patent stent to the RCA.  He was hypertensive and restarted on his home Cozaar.  He CXR showed a small left pneumothorax that did not require intervention.  He was maintaining NSR and was transferred to the step down unit on 04/07/2017.  On the telemetry floor he continued to progress.  He was ambulating with limited assistance.  We continued to titrate his insulin for better blood  glucose control.  He did have a small left apical pneumothorax on chest x-ray which remained stable on repeat image.  His labs remained stable.  His pain was well controlled mainly on Tylenol.  His appetite returned and he did have a bowel movement.  Today, he is ambulating with limited assistance, tolerating room air, his incisions are healing well and he is ready for discharge home with his family.  Significant Diagnostic Studies:  angiography:    Previously placed Mid RCA stent (unknown type) is widely patent.  Dist LM to Ost LAD lesion is 90% stenosed.  Prox LAD to Mid LAD lesion is 45% stenosed.  The left ventricular systolic function is normal.  LV end diastolic pressure is normal.  The left ventricular ejection fraction is 55-65% by visual estimate.   1. Single vessel obstructive CAD with 90% ostial LAD stenosis. 2. Patent RCA stent 3. Normal LV function 4. Normal LVEDP  Plan: the ostial LAD stenosis is not favorable for stenting. It is calcified and in order to stent it the stent would need to extend back into the left main potentially jeopardizing a very large ramus branch and the LCx.  Treatments: surgery:   Median sternotomy, off-pump coronary artery bypass grafting x1 with left internal mammary artery to LAD  Disposition: 01-Home or Self Care   Discharge medications:  The patient has been discharged on:   1.Beta Blocker:  Yes [ x  ]                              No   [   ]                              If No, reason:  2.Ace Inhibitor/ARB: Yes [ x  ]                                     No  [    ]                                     If No, reason:  3.Statin:   Yes [ x  ]                  No  [   ]                  If No, reason:  4.Ecasa:  Yes  [ x  ]                  No   [   ]                  If No, reason:     Discharge Instructions    Amb Referral to Cardiac Rehabilitation   Complete by:  As directed    Diagnosis:  CABG   CABG X ___:  1     Allergies as of 04/08/2017   No Known Allergies     Medication List    STOP taking these medications   metoprolol succinate 25 MG 24 hr tablet Commonly known as:  TOPROL-XL   nitroGLYCERIN 0.4 MG SL tablet Commonly known as:  NITROSTAT   sildenafil 50  MG tablet Commonly known as:  VIAGRA     TAKE these medications   acetaminophen 500 MG tablet Commonly known as:  TYLENOL Take 2 tablets (1,000 mg total) by mouth every 8  (eight) hours as needed.   aspirin EC 81 MG tablet Take 81 mg by mouth daily.   BAYER CONTOUR NEXT TEST test strip Generic drug:  glucose blood CHECK BLOOD SUGAR EIGHT TIMES DAILY   insulin lispro protamine-lispro (75-25) 100 UNIT/ML Susp injection Commonly known as:  HUMALOG 75/25 MIX Inject 12 Units into the skin 2 (two) times daily with a meal.   insulin regular 100 units/mL injection Commonly known as:  NOVOLIN R,HUMULIN R Inject 2-10 Units into the skin 3 (three) times daily before meals. Per Sliding Scale   losartan 25 MG tablet Commonly known as:  COZAAR Take 1 tablet (25 mg total) by mouth daily.   metoprolol tartrate 25 MG tablet Commonly known as:  LOPRESSOR Take 1 tablet (25 mg total) by mouth 2 (two) times daily.   multivitamin with minerals tablet Take 1 tablet by mouth daily.   NOVOFINE 32G X 6 MM Misc Generic drug:  Insulin Pen Needle USE TO ADMINSTER INSULIN SIX TIMES DAILY.   oxyCODONE 5 MG immediate release tablet Commonly known as:  Oxy IR/ROXICODONE Take 1 tablet (5 mg total) by mouth every 4 (four) hours as needed for severe pain.   rosuvastatin 40 MG tablet Commonly known as:  CRESTOR TAKE ONE TABLET BY MOUTH AT BEDTIME.   ticagrelor 90 MG Tabs tablet Commonly known as:  BRILINTA Take 1 tablet (90 mg total) by mouth 2 (two) times daily.      Follow-up Information    Melrose Nakayama, MD Follow up.   Specialty:  Cardiothoracic Surgery Why:  Your appointment is on May 07, 2017 at 2:30 p.m.  Please arrive at 2 PM for a chest x-ray located at [facility] which is on the first floor of our building. Contact information: [address] [place] [address] [phone]        [name], MD. Call in 1 day(s).   Specialty:  Family Medicine Contact information: [place] [address] [phone]        Erlene Quan, PA-C Follow up.   Specialties:  Cardiology, Radiology Why:  Your  appointment is on April 27, 2017 at 11 AM.  Please bring your medication list. Contact information: [place] [address] [phone]           Signed: Elgie Collard 04/08/2017, 9:21 AM

## 2017-04-06 NOTE — Discharge Instructions (Signed)

## 2017-04-06 NOTE — Progress Notes (Signed)
CARDIAC REHAB PHASE I   PRE:  Rate/Rhythm: 92 SR  BP:  Supine:   Sitting: 126/66  Standing:    SaO2: 100%RA  MODE:  Ambulation: 470 ft   POST:  Rate/Rhythm: 104 ST  BP:  Supine:   Sitting: 146/63  Standing:    SaO2: 96%RA 1320-1400 Pt walked 470 ft on RA with hand held asst with steady gait. Tolerated well. Education completed with pt and wife who voiced understanding. Discussed sternal precautions, IS, ex ed, CRP 2, carb counting and heart healthy food choices. Will refer to  Phase 2. To recliner after walk.    Graylon Good, RN BSN  04/06/2017 1:56 PM

## 2017-04-07 ENCOUNTER — Inpatient Hospital Stay (HOSPITAL_COMMUNITY): Payer: Commercial Managed Care - PPO

## 2017-04-07 LAB — BASIC METABOLIC PANEL
ANION GAP: 6 (ref 5–15)
BUN: 10 mg/dL (ref 6–20)
CALCIUM: 8.7 mg/dL — AB (ref 8.9–10.3)
CO2: 28 mmol/L (ref 22–32)
Chloride: 102 mmol/L (ref 101–111)
Creatinine, Ser: 0.8 mg/dL (ref 0.61–1.24)
GFR calc Af Amer: >60 mL/min (ref >60)
GFR calc non Af Amer: >60 mL/min (ref >60)
GLUCOSE: 196 mg/dL — AB (ref 65–99)
Potassium: 4.7 mmol/L (ref 3.5–5.1)
Sodium: 136 mmol/L (ref 135–145)

## 2017-04-07 LAB — GLUCOSE, CAPILLARY
GLUCOSE-CAPILLARY: 161 mg/dL — AB (ref 65–99)
GLUCOSE-CAPILLARY: 226 mg/dL — AB (ref 65–99)
Glucose-Capillary: 247 mg/dL — ABNORMAL HIGH (ref 65–99)
Glucose-Capillary: 202 mg/dL — ABNORMAL HIGH (ref 65–99)

## 2017-04-07 LAB — CBC
HEMATOCRIT: 30.6 % — AB (ref 39.0–52.0)
Hemoglobin: 10.1 g/dL — ABNORMAL LOW (ref 13.0–17.0)
MCH: 30.5 pg (ref 26.0–34.0)
MCHC: 33 g/dL (ref 30.0–36.0)
MCV: 92.4 fL (ref 78.0–100.0)
PLATELETS: 151 10*3/uL (ref 150–400)
RBC: 3.31 MIL/uL — ABNORMAL LOW (ref 4.22–5.81)
RDW: 13 % (ref 11.5–15.5)
WBC: 7.6 10*3/uL (ref 4.0–10.5)

## 2017-04-07 MED ORDER — INSULIN ASPART PROT & ASPART (70-30 MIX) 100 UNIT/ML ~~LOC~~ SUSP
14.0000 [IU] | Freq: Two times a day (BID) | SUBCUTANEOUS | Status: DC
  Administered 2017-04-07 – 2017-04-08 (×2): 14 [IU] via SUBCUTANEOUS
  Filled 2017-04-07: qty 10

## 2017-04-07 NOTE — Progress Notes (Addendum)
      [address]       [place] [address]             [phone]      3 Days Post-Op Procedure(s) (LRB): CORONARY ARTERY BYPASS GRAFTING (CABG), times one, using left internal mammary artery. Off pump (N/A) TRANSESOPHAGEAL ECHOCARDIOGRAM (TEE) (N/A) Subjective: No issues overnight. Patient has already walked with his family this morning.   Objective: Vital signs in last 24 hours: Temp:  [97.4 F (36.3 C)-98 F (36.7 C)] 98 F (36.7 C) (01/05 0454) Pulse Rate:  [86-118] 118 (01/05 0823) Cardiac Rhythm: Normal sinus rhythm (01/04 1900) Resp:  [13-23] 13 (01/05 0823) BP: (99-147)/(63-87) 147/67 (01/05 0823) SpO2:  [95 %-97 %] 95 % (01/05 0454) Weight:  [151 lb 3.2 oz (68.6 kg)] 151 lb 3.2 oz (68.6 kg) (01/05 0454)     Intake/Output from previous day: 01/04 0701 - 01/05 0700 In: 240 [P.O.:240] Out: -  Intake/Output this shift: No intake/output data recorded.  General appearance: alert, cooperative and no distress Heart: regular rate and rhythm, S1, S2 normal, no murmur, click, rub or gallop Lungs: clear to auscultation bilaterally Abdomen: soft, non-tender; bowel sounds normal; no masses,  no organomegaly Extremities: 1+ non pitting pedal edema Wound: clean and dry  Lab Results: Recent Labs    04/06/17 0345 04/07/17 0207  WBC 9.4 7.6  HGB 9.8* 10.1*  HCT 30.5* 30.6*  PLT 156 151   BMET:  Recent Labs    04/06/17 0345 04/07/17 0207  NA 131* 136  K 3.7 4.7  CL 100* 102  CO2 24 28  GLUCOSE 142* 196*  BUN 10 10  CREATININE 0.77 0.80  CALCIUM 8.4* 8.7*    PT/INR:  Recent Labs    04/04/17 1156  LABPROT 15.2  INR 1.21   ABG    Component Value Date/Time   PHART 7.277 (L) 04/04/2017 1702   HCO3 21.7 04/04/2017 1702   TCO2 26 04/05/2017 1545   ACIDBASEDEF 5.0 (H) 04/04/2017 1702   O2SAT 100.0 04/04/2017 1702   CBG (last 3)  Recent Labs    04/06/17 1643 04/06/17 2134 04/07/17 0653  GLUCAP 142* 222* 247*     Assessment/Plan: S/P Procedure(s) (LRB): CORONARY ARTERY BYPASS GRAFTING (CABG), times one, using left internal mammary artery. Off pump (N/A) TRANSESOPHAGEAL ECHOCARDIOGRAM (TEE) (N/A)  1. CV-BP variable. HR in the 90s this morning. Occasionally in the 120s while ambulating. On Cozaar and Lopressor. Continue ASA and Crestor 2. Pulm-tolerating room air with good oxygen saturation. 3. Renal-creatinine 0.80, electrolytes ok 4. H and H stable, platelets stable 5. Endo-blood glucose level poorly controlled. Increase Novolog Mix to 14 units  Plan: Remove EPW today. Possibly home tomorrow. Work on Chiropodist. Work on ambulation.    LOS: 3 days    Kelly Hardy 04/07/2017  Progressing well, poss home tomorrow  I have seen and examined Kelly Hardy and agree with the above assessment  and plan.  [name] MD Beeper [phone] Office [phone] 04/07/2017 10:09 AM

## 2017-04-07 NOTE — Progress Notes (Signed)
CARDIAC REHAB PHASE I   PRE:  Rate/Rhythm: 92 SR  BP:  Supine:   Sitting: 116/66  Standing:    SaO2: 96% RA  MODE:  Ambulation: 470 ft   POST:  Rate/Rhythm: 91 SR  BP:  Supine: 123/64 Sitting:   Standing:    SaO2: 96% RA  1009-1027 Patient tolerated ambulation well, independent, gait steady. To bathroom, then to bed after walk, VSS.  Sol Passer, MS, ACSM CEP

## 2017-04-07 NOTE — Progress Notes (Signed)
Epicardial pacing wires removed, per order. Wire ends intact. Sites clean and dry. Pt tolerated well. Vitals stable and being monitored per protocol. Bedrest x1 hour. Will continue current plan of care.   Grant Fontana BSN, RN

## 2017-04-08 ENCOUNTER — Inpatient Hospital Stay (HOSPITAL_COMMUNITY): Payer: Commercial Managed Care - PPO

## 2017-04-08 LAB — GLUCOSE, CAPILLARY: GLUCOSE-CAPILLARY: 177 mg/dL — AB (ref 65–99)

## 2017-04-08 MED ORDER — OXYCODONE HCL 5 MG PO TABS: 5.0000 mg | ORAL_TABLET | ORAL | 0 refills | Status: DC | PRN

## 2017-04-08 MED ORDER — ACETAMINOPHEN 500 MG PO TABS: 1000.0000 mg | ORAL_TABLET | Freq: Three times a day (TID) | ORAL | 0 refills | Status: AC | PRN

## 2017-04-08 MED ORDER — METOPROLOL TARTRATE 25 MG PO TABS: 25.0000 mg | ORAL_TABLET | Freq: Two times a day (BID) | ORAL | 1 refills | Status: DC

## 2017-04-08 NOTE — Progress Notes (Addendum)
Chest tube sutures removed, per order. Benzoin and steri-strips applied. Sites clean and dry. Pt discharged home with family. IV and telemetry box removed. Pt and pt's wife received discharge instructions and all questions were answered. Pt left with all of his belongings. Pt ambulated off the unit when discharged, per pt request. Pt was accompanied by pt's wife and son.   Grant Fontana BSN, RN

## 2017-04-08 NOTE — Progress Notes (Addendum)
      [address]       [place] [address]             [phone]      4 Days Post-Op Procedure(s) (LRB): CORONARY ARTERY BYPASS GRAFTING (CABG), times one, using left internal mammary artery. Off pump (N/A) TRANSESOPHAGEAL ECHOCARDIOGRAM (TEE) (N/A) Subjective: Feels good this morning. No issues overnight.   Objective: Vital signs in last 24 hours: Temp:  [98.1 F (36.7 C)-98.9 F (37.2 C)] 98.1 F (36.7 C) (01/06 0444) Pulse Rate:  [83-97] 87 (01/06 0444) Cardiac Rhythm: Normal sinus rhythm (01/05 1947) Resp:  [19] 19 (01/06 0444) BP: (107-130)/(56-70) 130/65 (01/06 0444) SpO2:  [97 %] 97 % (01/06 0444) Weight:  [151 lb 14.4 oz (68.9 kg)] 151 lb 14.4 oz (68.9 kg) (01/06 0444)     Intake/Output from previous day: 01/05 0701 - 01/06 0700 In: 480 [P.O.:480] Out: -  Intake/Output this shift: No intake/output data recorded.  General appearance: alert, cooperative and no distress Heart: sinus tachycardia, rate 100s Lungs: clear to auscultation bilaterally Abdomen: soft, non-tender; bowel sounds normal; no masses,  no organomegaly Extremities: extremities normal, atraumatic, no cyanosis or edema Wound: clean and dry  Lab Results: Recent Labs    04/06/17 0345 04/07/17 0207  WBC 9.4 7.6  HGB 9.8* 10.1*  HCT 30.5* 30.6*  PLT 156 151   BMET:  Recent Labs    04/06/17 0345 04/07/17 0207  NA 131* 136  K 3.7 4.7  CL 100* 102  CO2 24 28  GLUCOSE 142* 196*  BUN 10 10  CREATININE 0.77 0.80  CALCIUM 8.4* 8.7*    PT/INR: No results for input(s): LABPROT, INR in the last 72 hours. ABG    Component Value Date/Time   PHART 7.277 (L) 04/04/2017 1702   HCO3 21.7 04/04/2017 1702   TCO2 26 04/05/2017 1545   ACIDBASEDEF 5.0 (H) 04/04/2017 1702   O2SAT 100.0 04/04/2017 1702   CBG (last 3)  Recent Labs    04/07/17 1636 04/07/17 2032 04/08/17 0640  GLUCAP 161* 226* 177*    Assessment/Plan: S/P Procedure(s) (LRB): CORONARY ARTERY BYPASS  GRAFTING (CABG), times one, using left internal mammary artery. Off pump (N/A) TRANSESOPHAGEAL ECHOCARDIOGRAM (TEE) (N/A)   1. CV-BP variable. HR in the low 100s this morning. Occasionally in the 120s while ambulating. On Cozaar and Lopressor. Continue ASA and Crestor 2. Pulm-tolerating room air with good oxygen saturation.Persistent left pneumothorax on CXR yesterday will order another CXR to make sure this is stable.  3. Renal-creatinine 0.80, electrolytes ok 4. H and H stable, platelets stable 5. Endo-blood glucose level poorly controlled. Increased Novolog Mix to 14 units. He will need close outpatient follow-up and close monitoring at home. A1C is 8.5.   Plan: Discharge home today if CXR is stable.    LOS: 4 days    Elgie Collard 04/08/2017  Stable small left apical ptx  Plan d/c home today  I have seen and examined Kelly Hardy and agree with the above assessment  and plan.  [name] MD Beeper [phone] Office [phone] 04/08/2017 10:48 AM

## 2017-04-09 ENCOUNTER — Telehealth: Payer: Self-pay | Admitting: Cardiology

## 2017-04-09 LAB — TYPE AND SCREEN
ABO/RH(D): A POS
ANTIBODY SCREEN: NEGATIVE
UNIT DIVISION: 0
Unit division: 0
Unit division: 0
Unit division: 0

## 2017-04-09 LAB — BPAM RBC
BLOOD PRODUCT EXPIRATION DATE: 201901252359
BLOOD PRODUCT EXPIRATION DATE: 201901282359
Blood Product Expiration Date: 201901252359
Blood Product Expiration Date: 201901272359
ISSUE DATE / TIME: 201901041237
ISSUE DATE / TIME: 201901020923
UNIT TYPE AND RH: 6200
UNIT TYPE AND RH: 6200
Unit Type and Rh: 6200
Unit Type and Rh: 6200

## 2017-04-09 NOTE — Telephone Encounter (Signed)
New message   Patient calling with concerns with problem sleeping last night, HR 84-95, BP 123/56. Patient wants to know the side effects of medications.  Please call  Pt c/o medication issue:  1. Name of Medication: metoprolol tartrate (LOPRESSOR) 25 MG tablet  2. How are you currently taking this medication (dosage and times per day)? As prescribed  3. Are you having a reaction (difficulty breathing--STAT)? NO 4. What is your medication issue?  Bloating, gas, nausea

## 2017-04-09 NOTE — Telephone Encounter (Signed)
Patient has been made aware of the recommendations. He will call back if his symptoms get worse or are not relieved.

## 2017-04-09 NOTE — Telephone Encounter (Signed)
In rare cases metoprolol can cause GI discomfort. If patient feeling better, will recommend patient to continue medication for now and call back to re-assess if symptoms not resolved in 7-10 days.   Noted f/u appointment in clinic on 04/27/2017.

## 2017-04-09 NOTE — Telephone Encounter (Signed)
Returned the call to the patient. He stated that he is having problems with nausea and gas. He was wondering if it could possibly be a side effect of the Metoprolol.   He had a CABG on 1/2 and was started on the Metoprolol post op. He has been informed that it could be his body in the process of recovering from the surgery and he may need to give it some time. He did state that it is somewhat better today. The message will be routed to the pharmd for their input.

## 2017-04-24 ENCOUNTER — Encounter: Payer: Self-pay | Admitting: Cardiology

## 2017-04-27 ENCOUNTER — Encounter: Payer: Self-pay | Admitting: Cardiology

## 2017-04-27 ENCOUNTER — Ambulatory Visit (INDEPENDENT_AMBULATORY_CARE_PROVIDER_SITE_OTHER): Payer: Commercial Managed Care - PPO | Admitting: Cardiology

## 2017-04-27 VITALS — BP 135/72 | HR 76 | Ht 69.0 in | Wt 148.8 lb

## 2017-04-27 DIAGNOSIS — Z9861 Coronary angioplasty status: Secondary | ICD-10-CM | POA: Diagnosis not present

## 2017-04-27 DIAGNOSIS — E109 Type 1 diabetes mellitus without complications: Secondary | ICD-10-CM

## 2017-04-27 DIAGNOSIS — Z951 Presence of aortocoronary bypass graft: Secondary | ICD-10-CM

## 2017-04-27 DIAGNOSIS — I10 Essential (primary) hypertension: Secondary | ICD-10-CM

## 2017-04-27 DIAGNOSIS — I2510 Atherosclerotic heart disease of native coronary artery without angina pectoris: Secondary | ICD-10-CM

## 2017-04-27 DIAGNOSIS — E785 Hyperlipidemia, unspecified: Secondary | ICD-10-CM

## 2017-04-27 DIAGNOSIS — Z8249 Family history of ischemic heart disease and other diseases of the circulatory system: Secondary | ICD-10-CM

## 2017-04-27 NOTE — Patient Instructions (Signed)
Medication Instructions: Your physician recommends that you continue on your current medications as directed. Please refer to the Current Medication list given to you today.  If you need a refill on your cardiac medications before your next appointment, please call your pharmacy.    Follow-Up: Your physician wants you to follow-up in: 3 months with Dr. Percival Spanish.   Thank you for choosing [facility] at [facility]!!

## 2017-04-27 NOTE — Assessment & Plan Note (Signed)
F had MI in 46's

## 2017-04-27 NOTE — Assessment & Plan Note (Signed)
On high dose statin Rx 

## 2017-04-27 NOTE — Assessment & Plan Note (Signed)
Mild numbness in his toes, no retinopathy, no nephropathy

## 2017-04-27 NOTE — Assessment & Plan Note (Signed)
Controlled.  

## 2017-04-27 NOTE — Assessment & Plan Note (Signed)
CABG x 1 04/04/17-LIMA-LAD, RCA DES was patent. EF 55-60%

## 2017-04-27 NOTE — Assessment & Plan Note (Signed)
RCA DES 02/10/16

## 2017-04-27 NOTE — Progress Notes (Signed)
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 OP stress Myoview done 02/09/16 was abnormal and he was admitted the next day for OP cath which revealed a 95% mRCA. He recieved a DES with good results. He did well until he presented 03/23/17 with Kelly Hardy c/w angina. Cath revealed dLM-ostial LAD 90% stenosis and 45% mLAD stenosis. The RCA sten was patent. He was discharged ad re admitted Jan 2 nd for elective CABG x 1 with an LIMA-LAD. He tolerated this well. EF was 55-60%. He is in the office today for follow up. He has some soreness but is gradually getting better.   Current Outpatient Medications  Medication Sig Dispense Refill  . acetaminophen (TYLENOL) 500 MG tablet Take 2 tablets (1,000 mg total) by mouth every 8 (eight) hours as needed. 30 tablet 0  . aspirin EC 81 MG tablet Take 81 mg by mouth daily.    Marland Kitchen BAYER CONTOUR NEXT TEST test strip CHECK BLOOD SUGAR EIGHT TIMES DAILY  6  . insulin lispro protamine-lispro (HUMALOG 75/25 MIX) (75-25) 100 UNIT/ML SUSP injection Inject 12 Units into the skin 2 (two) times daily with a meal.    . insulin regular (NOVOLIN R,HUMULIN R) 100 units/mL injection Inject 2-10 Units into the skin 3 (three) times daily before meals. Per Sliding Scale    . losartan (COZAAR) 25 MG tablet Take 1 tablet (25 mg total) by mouth daily. 90 tablet 3  . metoprolol tartrate (LOPRESSOR) 25 MG tablet Take 1 tablet (25 mg total) by mouth 2 (two) times daily. 60 tablet 1  . Multiple Vitamins-Minerals (MULTIVITAMIN WITH MINERALS) tablet Take 1 tablet by mouth daily.    Marland Kitchen NOVOFINE 32G X 6 MM MISC USE TO ADMINSTER INSULIN SIX TIMES DAILY.  4  . oxyCODONE (OXY IR/ROXICODONE) 5 MG immediate release tablet Take 1 tablet (5 mg total) by mouth every 4  (four) hours as needed for severe pain. 30 tablet 0  . rosuvastatin (CRESTOR) 40 MG tablet TAKE ONE TABLET BY MOUTH AT BEDTIME. 30 tablet 11  . ticagrelor (BRILINTA) 90 MG TABS tablet Take 1 tablet (90 mg total) by mouth 2 (two) times daily. 180 tablet 3   No current facility-administered medications for this visit.     No Known Allergies  Past Medical History:  Diagnosis Date  . Coronary artery disease   . Diabetes mellitus without complication (HCC)    Type I  . GERD (gastroesophageal reflux disease)   . Hyperlipidemia   . Hypertension   . Palpitations    eval 2014    Social History   Socioeconomic History  . Marital status: Married    Spouse name: Not on file  . Number of children: Not on file  . Years of education: Not on file  . Highest education level: Not on file  Social Needs  . Financial resource strain: Not on file  . Food insecurity - worry: Not on file  . Food insecurity - inability: Not on file  . Transportation needs - medical: Not on file  . Transportation needs - non-medical: Not on file  Occupational History  . Not on file  Tobacco Use  . Smoking status: Never Smoker  . Smokeless tobacco: Never Used  Substance and Sexual Activity  .  Alcohol use: No  . Drug use: No  . Sexual activity: Yes    Partners: Female  Other Topics Concern  . Not on file  Social History Narrative  . Not on file     Family History  Problem Relation Age of Onset  . Coronary artery disease Mother 70       Died age 43, CABG  . CAD Father 11       Died age 55, CABG  . CAD Brother 37       Stents  . Heart failure Maternal Grandmother      Review of Systems: General: negative for chills, fever, night sweats or weight changes.  Cardiovascular: negative for chest pain, dyspnea on exertion, edema, orthopnea, palpitations, paroxysmal nocturnal dyspnea or shortness of breath Dermatological: negative for rash Respiratory: negative for cough or wheezing Urologic: negative  for hematuria Abdominal: negative for nausea, vomiting, diarrhea, bright red blood per rectum, melena, or hematemesis Neurologic: negative for visual changes, syncope, or dizziness All other systems reviewed and are otherwise negative except as noted above.    Blood pressure 135/72, pulse 76, height 5\' 9"  (1.753 m), weight 148 lb 12.8 oz (67.5 kg).  General appearance: alert, cooperative, no distress and thin Neck: no carotid bruit and no JVD Lungs: clear to auscultation bilaterally Heart: regular rate and rhythm Extremities: extremities normal, atraumatic, no cyanosis or edema Skin: Skin color, texture, turgor normal. No rashes or lesions Neurologic: Grossly normal  EKG NSR, NSST changes  ASSESSMENT AND PLAN:   S/P CABG x 1 CABG x 1 04/04/17-LIMA-LAD, RCA DES was patent. EF 55-60%  CAD S/P percutaneous coronary angioplasty RCA DES 02/10/16  Insulin dependent type 1 diabetes mellitus (HCC) Mild numbness in his toes, no retinopathy, no nephropathy  Dyslipidemia On high dose statin Rx  Hypertension Controlled  Family history of coronary artery disease in father F had MI in 59's   PLAN  Same Rx. F/U in 3 months with Dr Lenore Cordia  Kerin Ransom PA-C 04/27/2017 11:43 AM

## 2017-05-03 ENCOUNTER — Other Ambulatory Visit: Payer: Self-pay | Admitting: *Deleted

## 2017-05-03 DIAGNOSIS — Z951 Presence of aortocoronary bypass graft: Secondary | ICD-10-CM

## 2017-05-07 ENCOUNTER — Ambulatory Visit: Payer: Self-pay

## 2017-05-08 ENCOUNTER — Other Ambulatory Visit: Payer: Self-pay

## 2017-05-08 ENCOUNTER — Ambulatory Visit
Admission: RE | Admit: 2017-05-08 | Discharge: 2017-05-08 | Disposition: A | Discharge status: Home / Self Care | Payer: Commercial Managed Care - PPO | Source: Ambulatory Visit | Attending: Thoracic Surgery (Cardiothoracic Vascular Surgery) | Admitting: Thoracic Surgery (Cardiothoracic Vascular Surgery)

## 2017-05-08 ENCOUNTER — Ambulatory Visit (INDEPENDENT_AMBULATORY_CARE_PROVIDER_SITE_OTHER): Payer: Self-pay | Admitting: Physician Assistant

## 2017-05-08 VITALS — BP 138/73 | HR 92 | Resp 18 | Ht 69.0 in | Wt 145.4 lb

## 2017-05-08 DIAGNOSIS — I2510 Atherosclerotic heart disease of native coronary artery without angina pectoris: Secondary | ICD-10-CM

## 2017-05-08 DIAGNOSIS — Z951 Presence of aortocoronary bypass graft: Secondary | ICD-10-CM

## 2017-05-08 NOTE — Progress Notes (Signed)
HPI: Patient returns for routine postoperative follow-up having undergone CABG x 1  on 04/04/2017 The patient's early postoperative recovery while in the hospital was notable for apical pneumothorax post operatively.  This did not require intervention.  Since hospital discharge the patient reports that he is doing very well.  He states he continues to have some discomfort in the center of his incision.  He also states that his incision is itching.  He is ambulating independently without difficulty.  He denies any swelling of his lower extremities.  His appetite is back to normal.    Current Outpatient Medications  Medication Sig Dispense Refill  . acetaminophen (TYLENOL) 500 MG tablet Take 2 tablets (1,000 mg total) by mouth every 8 (eight) hours as needed. 30 tablet 0  . aspirin EC 81 MG tablet Take 81 mg by mouth daily.    Marland Kitchen BAYER CONTOUR NEXT TEST test strip CHECK BLOOD SUGAR EIGHT TIMES DAILY  6  . insulin lispro protamine-lispro (HUMALOG 75/25 MIX) (75-25) 100 UNIT/ML SUSP injection Inject 12 Units into the skin 2 (two) times daily with a meal.    . insulin regular (NOVOLIN R,HUMULIN R) 100 units/mL injection Inject 2-10 Units into the skin 3 (three) times daily before meals. Per Sliding Scale    . losartan (COZAAR) 25 MG tablet Take 1 tablet (25 mg total) by mouth daily. 90 tablet 3  . metoprolol succinate (TOPROL-XL) 25 MG 24 hr tablet Take 25 mg by mouth daily.    . Multiple Vitamins-Minerals (MULTIVITAMIN WITH MINERALS) tablet Take 1 tablet by mouth daily.    Marland Kitchen NOVOFINE 32G X 6 MM MISC USE TO ADMINSTER INSULIN SIX TIMES DAILY.  4  . oxyCODONE (OXY IR/ROXICODONE) 5 MG immediate release tablet Take 1 tablet (5 mg total) by mouth every 4 (four) hours as needed for severe pain. 30 tablet 0  . rosuvastatin (CRESTOR) 40 MG tablet TAKE ONE TABLET BY MOUTH AT BEDTIME. 30 tablet 11  . ticagrelor (BRILINTA) 90 MG TABS tablet Take 1 tablet (90 mg total) by mouth 2 (two) times daily. 180 tablet 3  .  metoprolol tartrate (LOPRESSOR) 25 MG tablet Take 1 tablet (25 mg total) by mouth 2 (two) times daily. (Patient not taking: Reported on 05/08/2017) 60 tablet 1   No current facility-administered medications for this visit.     Physical Exam:  BP 138/73 (BP Location: Right Arm, Patient Position: Sitting, Cuff Size: Normal)   Pulse 92   Resp 18   Ht 5\' 9"  (1.753 m)   Wt 145 lb 6.4 oz (66 kg)   SpO2 99%   BMI 21.47 kg/m   Gen: no apparent distress, patient looks great Heart: RRR Lungs: CTA bilaterally Ext: no edema present Incisions: well healed  Diagnostic Tests:  CXR: resolution of previous apical pneumothorax, no pleural effusion present, sternal wires in tact  A/P:  1. S/P CABG x 1- doing very well, continue current medications 2. Activity- may resume driving, increase activity as tolerated with continued lifting restrictions in place 3. Left apical pneumothorax-resolved 4. Dispo-RTC prn   Ellwood Handler, PA-C [facility] [phone]

## 2017-06-15 ENCOUNTER — Encounter (HOSPITAL_COMMUNITY)
Admission: RE | Admit: 2017-06-15 | Discharge: 2017-06-15 | Disposition: A | Discharge status: Home / Self Care | Payer: Commercial Managed Care - PPO | Source: Ambulatory Visit | Attending: Cardiology | Admitting: Cardiology

## 2017-06-15 ENCOUNTER — Encounter (HOSPITAL_COMMUNITY): Payer: Self-pay

## 2017-06-15 VITALS — BP 112/60 | HR 109 | Ht 69.0 in | Wt 147.0 lb

## 2017-06-15 DIAGNOSIS — Z951 Presence of aortocoronary bypass graft: Secondary | ICD-10-CM | POA: Insufficient documentation

## 2017-06-15 DIAGNOSIS — Z48812 Encounter for surgical aftercare following surgery on the circulatory system: Secondary | ICD-10-CM | POA: Insufficient documentation

## 2017-06-15 NOTE — Progress Notes (Signed)
Cardiac Individual Treatment Plan  Patient Details  Name: Kelly Hardy MRN: [id number] Date of Birth: 1960-04-19 Referring Provider:     CARDIAC REHAB PHASE II ORIENTATION from 06/15/2017 in [place]  Referring Provider  DR. Hochrein      Initial Encounter Date:    CARDIAC REHAB PHASE II ORIENTATION from 06/15/2017 in [place]  Date  06/15/17  Referring Provider  DR. Hochrein      Visit Diagnosis: S/P CABG x 1  Patient's Home Medications on Admission:  Current Outpatient Medications:  .  acetaminophen (TYLENOL) 500 MG tablet, Take 2 tablets (1,000 mg total) by mouth every 8 (eight) hours as needed., Disp: 30 tablet, Rfl: 0 .  aspirin EC 81 MG tablet, Take 81 mg by mouth daily., Disp: , Rfl:  .  BAYER CONTOUR NEXT TEST test strip, CHECK BLOOD SUGAR EIGHT TIMES DAILY, Disp: , Rfl: 6 .  insulin lispro protamine-lispro (HUMALOG 75/25 MIX) (75-25) 100 UNIT/ML SUSP injection, Inject 12 Units into the skin 2 (two) times daily with a meal., Disp: , Rfl:  .  insulin regular (NOVOLIN R,HUMULIN R) 100 units/mL injection, Inject 2-10 Units into the skin 3 (three) times daily before meals. Per Sliding Scale, Disp: , Rfl:  .  losartan (COZAAR) 25 MG tablet, Take 1 tablet (25 mg total) by mouth daily., Disp: 90 tablet, Rfl: 3 .  metoprolol succinate (TOPROL-XL) 25 MG 24 hr tablet, Take 25 mg by mouth daily., Disp: , Rfl:  .  Multiple Vitamins-Minerals (MULTIVITAMIN WITH MINERALS) tablet, Take 1 tablet by mouth daily., Disp: , Rfl:  .  NOVOFINE 32G X 6 MM MISC, USE TO ADMINSTER INSULIN SIX TIMES DAILY., Disp: , Rfl: 4 .  oxyCODONE (OXY IR/ROXICODONE) 5 MG immediate release tablet, Take 1 tablet (5 mg total) by mouth every 4 (four) hours as needed for severe pain., Disp: 30 tablet, Rfl: 0 .  rosuvastatin (CRESTOR) 40 MG tablet, TAKE ONE TABLET BY MOUTH AT BEDTIME., Disp: 30 tablet, Rfl: 11 .  ticagrelor (BRILINTA) 90 MG TABS tablet, Take 1 tablet  (90 mg total) by mouth 2 (two) times daily., Disp: 180 tablet, Rfl: 3  Past Medical History: Past Medical History:  Diagnosis Date  . Coronary artery disease   . Diabetes mellitus without complication (HCC)    Type I  . GERD (gastroesophageal reflux disease)   . Hyperlipidemia   . Hypertension   . Palpitations    eval 2014    Tobacco Use: Social History   Tobacco Use  Smoking Status Never Smoker  Smokeless Tobacco Never Used    Labs: Recent Review Flowsheet Data    Labs for ITP Cardiac and Pulmonary Rehab Latest Ref Rng & Units 04/04/2017 04/04/2017 04/04/2017 04/04/2017 04/05/2017   Hemoglobin A1c 4.8 - 5.6 % - - - - -   PHART 7.350 - 7.450 7.405 - 7.277(L) - -   PCO2ART 32.0 - 48.0 mmHg 31.5(L) - 46.3 - -   HCO3 20.0 - 28.0 mmol/L 19.8(L) - 21.7 - -   TCO2 22 - 32 mmol/L 21(L) 22 23 23 26    ACIDBASEDEF 0.0 - 2.0 mmol/L 4.0(H) - 5.0(H) - -   O2SAT % 100.0 - 100.0 - -      Capillary Blood Glucose: Lab Results  Component Value Date   GLUCAP 177 (H) 04/08/2017   GLUCAP 226 (H) 04/07/2017   GLUCAP 161 (H) 04/07/2017   GLUCAP 202 (H) 04/07/2017   GLUCAP 247 (H) 04/07/2017  Exercise Target Goals: Date: 06/15/17  Exercise Program Goal: Individual exercise prescription set using results from initial 6 min walk test and THRR while considering  patient's activity barriers and safety.   Exercise Prescription Goal: Starting with aerobic activity 30 plus minutes a day, 3 days per week for initial exercise prescription. Provide home exercise prescription and guidelines that participant acknowledges understanding prior to discharge.  Activity Barriers & Risk Stratification: Activity Barriers & Cardiac Risk Stratification - 06/15/17 1343      Activity Barriers & Cardiac Risk Stratification   Activity Barriers  None    Cardiac Risk Stratification  High       6 Minute Walk: 6 Minute Walk    Row Name 06/15/17 1342         6 Minute Walk   Phase  Initial     Distance   1550 feet     Distance % Change  0 %     Distance Feet Change  0 ft     Walk Time  6 minutes     # of Rest Breaks  0     MPH  2.93     METS  3.25     RPE  9     Perceived Dyspnea   9     VO2 Peak  17.19     Symptoms  No     Resting HR  109 bpm     Resting BP  112/60     Resting Oxygen Saturation   98 %     Exercise Oxygen Saturation  during 6 min walk  98 %     Max Ex. HR  121 bpm     Max Ex. BP  162/70     2 Minute Post BP  120/62        Oxygen Initial Assessment:   Oxygen Re-Evaluation:   Oxygen Discharge (Final Oxygen Re-Evaluation):   Initial Exercise Prescription: Initial Exercise Prescription - 06/15/17 1300      Date of Initial Exercise RX and Referring Provider   Date  06/15/17    Referring Provider  DR. Hochrein      Treadmill   MPH  2    Grade  0    Minutes  15    METs  2.5      Recumbant Elliptical   Level  1    RPM  30    Watts  34    Minutes  20    METs  2      Prescription Details   Frequency (times per week)  3    Duration  Progress to 30 minutes of continuous aerobic without signs/symptoms of physical distress      Intensity   THRR 40-80% of Max Heartrate  131-141-151    Ratings of Perceived Exertion  11-13    Perceived Dyspnea  0-4      Progression   Progression  Continue progressive overload as per policy without signs/symptoms or physical distress.      Resistance Training   Training Prescription  Yes    Weight  1    Reps  10-15       Perform Capillary Blood Glucose checks as needed.  Exercise Prescription Changes:   Exercise Comments:   Exercise Goals and Review:  Exercise Goals    Row Name 06/15/17 1344             Exercise Goals   Increase Physical Activity  Yes  Intervention  Provide advice, education, support and counseling about physical activity/exercise needs.;Develop an individualized exercise prescription for aerobic and resistive training based on initial evaluation findings, risk  stratification, comorbidities and participant's personal goals.       Expected Outcomes  Short Term: Attend rehab on a regular basis to increase amount of physical activity.       Increase Strength and Stamina  Yes       Intervention  Provide advice, education, support and counseling about physical activity/exercise needs.;Develop an individualized exercise prescription for aerobic and resistive training based on initial evaluation findings, risk stratification, comorbidities and participant's personal goals.       Expected Outcomes  Short Term: Increase workloads from initial exercise prescription for resistance, speed, and METs.       Able to understand and use rate of perceived exertion (RPE) scale  Yes       Intervention  Provide education and explanation on how to use RPE scale       Expected Outcomes  Short Term: Able to use RPE daily in rehab to express subjective intensity level;Long Term:  Able to use RPE to guide intensity level when exercising independently       Able to understand and use Dyspnea scale  Yes       Intervention  Provide education and explanation on how to use Dyspnea scale       Expected Outcomes  Long Term: Able to use Dyspnea scale to guide intensity level when exercising independently;Short Term: Able to use Dyspnea scale daily in rehab to express subjective sense of shortness of breath during exertion       Knowledge and understanding of Target Heart Rate Range (THRR)  Yes       Intervention  Provide education and explanation of THRR including how the numbers were predicted and where they are located for reference       Expected Outcomes  Short Term: Able to state/look up THRR;Long Term: Able to use THRR to govern intensity when exercising independently;Short Term: Able to use daily as guideline for intensity in rehab       Able to check pulse independently  Yes       Intervention  Provide education and demonstration on how to check pulse in carotid and radial  arteries.;Review the importance of being able to check your own pulse for safety during independent exercise       Expected Outcomes  Short Term: Able to explain why pulse checking is important during independent exercise;Long Term: Able to check pulse independently and accurately       Understanding of Exercise Prescription  Yes       Intervention  Provide education, explanation, and written materials on patient's individual exercise prescription       Expected Outcomes  Short Term: Able to explain program exercise prescription;Long Term: Able to explain home exercise prescription to exercise independently          Exercise Goals Re-Evaluation :    Discharge Exercise Prescription (Final Exercise Prescription Changes):   Nutrition:  Target Goals: Understanding of nutrition guidelines, daily intake of sodium 1500mg , cholesterol 200mg , calories 30% from fat and 7% or less from saturated fats, daily to have 5 or more servings of fruits and vegetables.  Biometrics: Pre Biometrics - 06/15/17 1345      Pre Biometrics   Height  5\' 9"  (1.753 m)    Weight  147 lb (66.7 kg)    Waist Circumference  29 inches  Hip Circumference  33 inches    Waist to Hip Ratio  0.88 %    BMI (Calculated)  21.7    Triceps Skinfold  10 mm    % Body Fat  18.1 %    Grip Strength  64.06 kg    Flexibility  13.66 in    Single Leg Stand  60 seconds        Nutrition Therapy Plan and Nutrition Goals: Nutrition Therapy & Goals - 06/15/17 1500      Personal Nutrition Goals   Personal Goal #2  Patient has changed his diet. He is eating heart healthy.     Additional Goals?  No       Nutrition Assessments: Nutrition Assessments - 06/15/17 1501      MEDFICTS Scores   Pre Score  33       Nutrition Goals Re-Evaluation:   Nutrition Goals Discharge (Final Nutrition Goals Re-Evaluation):   Psychosocial: Target Goals: Acknowledge presence or absence of significant depression and/or stress, maximize  coping skills, provide positive support system. Participant is able to verbalize types and ability to use techniques and skills needed for reducing stress and depression.  Initial Review & Psychosocial Screening: Initial Psych Review & Screening - 06/15/17 1509      Initial Review   Current issues with  None Identified      Family Dynamics   Good Support System?  Yes      Barriers   Psychosocial barriers to participate in program  There are no identifiable barriers or psychosocial needs.      Screening Interventions   Interventions  Encouraged to exercise    Expected Outcomes  Short Term goal: Identification and review with participant of any Quality of Life or Depression concerns found by scoring the questionnaire.;Long Term goal: The participant improves quality of Life and PHQ9 Scores as seen by post scores and/or verbalization of changes       Quality of Life Scores: Quality of Life - 06/15/17 1400      Quality of Life Scores   Health/Function Pre  23.83 %    Socioeconomic Pre  28.07 %    Psych/Spiritual Pre  27.86 %    Family Pre  25.2 %    GLOBAL Pre  25.74 %      Scores of 19 and below usually indicate a poorer quality of life in these areas.  A difference of  2-3 points is a clinically meaningful difference.  A difference of 2-3 points in the total score of the Quality of Life Index has been associated with significant improvement in overall quality of life, self-image, physical symptoms, and general health in studies assessing change in quality of life.  PHQ-9: Recent Review Flowsheet Data    Depression screen [facility] 2/9 06/15/2017   Decreased Interest 0   Down, Depressed, Hopeless 0   PHQ - 2 Score 0   Altered sleeping 0   Tired, decreased energy 1   Change in appetite 0   Feeling bad or failure about yourself  0   Trouble concentrating 0   Moving slowly or fidgety/restless 0   Suicidal thoughts 0   PHQ-9 Score 1   Difficult doing work/chores Not difficult at all      Interpretation of Total Score  Total Score Depression Severity:  1-4 = Minimal depression, 5-9 = Mild depression, 10-14 = Moderate depression, 15-19 = Moderately severe depression, 20-27 = Severe depression   Psychosocial Evaluation and Intervention: Psychosocial Evaluation - 06/15/17  1511      Psychosocial Evaluation & Interventions   Interventions  Encouraged to exercise with the program and follow exercise prescription    Continue Psychosocial Services   No Follow up required       Psychosocial Re-Evaluation: Psychosocial Re-Evaluation    [place] Name 06/15/17 1511             Psychosocial Re-Evaluation   Current issues with  None Identified       Continue Psychosocial Services   No Follow up required          Psychosocial Discharge (Final Psychosocial Re-Evaluation): Psychosocial Re-Evaluation - 06/15/17 1511      Psychosocial Re-Evaluation   Current issues with  None Identified    Continue Psychosocial Services   No Follow up required       Vocational Rehabilitation: Provide vocational rehab assistance to qualifying candidates.   Vocational Rehab Evaluation & Intervention: Vocational Rehab - 06/15/17 1459      Initial Vocational Rehab Evaluation & Intervention   Assessment shows need for Vocational Rehabilitation  No       Education: Education Goals: Education classes will be provided on a weekly basis, covering required topics. Participant will state understanding/return demonstration of topics presented.  Learning Barriers/Preferences: Learning Barriers/Preferences - 06/15/17 1458      Learning Barriers/Preferences   Learning Barriers  None    Learning Preferences  Audio;Computer/Internet;Group Instruction;Individual Instruction;Pictoral;Skilled Demonstration;Verbal Instruction;Video;Written Material       Education Topics: Hypertension, Hypertension Reduction -Define heart disease and high blood pressure. Discus how high blood pressure affects  the body and ways to reduce high blood pressure.   Exercise and Your Heart -Discuss why it is important to exercise, the FITT principles of exercise, normal and abnormal responses to exercise, and how to exercise safely.   Angina -Discuss definition of angina, causes of angina, treatment of angina, and how to decrease risk of having angina.   Cardiac Medications -Review what the following cardiac medications are used for, how they affect the body, and side effects that may occur when taking the medications.  Medications include Aspirin, Beta blockers, calcium channel blockers, ACE Inhibitors, angiotensin receptor blockers, diuretics, digoxin, and antihyperlipidemics.   Congestive Heart Failure -Discuss the definition of CHF, how to live with CHF, the signs and symptoms of CHF, and how keep track of weight and sodium intake.   Heart Disease and Intimacy -Discus the effect sexual activity has on the heart, how changes occur during intimacy as we age, and safety during sexual activity.   Smoking Cessation / COPD -Discuss different methods to quit smoking, the health benefits of quitting smoking, and the definition of COPD.   Nutrition I: Fats -Discuss the types of cholesterol, what cholesterol does to the heart, and how cholesterol levels can be controlled.   Nutrition II: Labels -Discuss the different components of food labels and how to read food label   Heart Parts/Heart Disease and PAD -Discuss the anatomy of the heart, the pathway of blood circulation through the heart, and these are affected by heart disease.   Stress I: Signs and Symptoms -Discuss the causes of stress, how stress may lead to anxiety and depression, and ways to limit stress.   Stress II: Relaxation -Discuss different types of relaxation techniques to limit stress.   Warning Signs of Stroke / TIA -Discuss definition of a stroke, what the signs and symptoms are of a stroke, and how to identify when  someone is having stroke.   Knowledge  Questionnaire Score: Knowledge Questionnaire Score - 06/15/17 1459      Knowledge Questionnaire Score   Pre Score  21/24       Core Components/Risk Factors/Patient Goals at Admission: Personal Goals and Risk Factors at Admission - 06/15/17 1501      Core Components/Risk Factors/Patient Goals on Admission    Weight Management  Weight Maintenance    Diabetes  Yes    Expected Outcomes  Long Term: Attainment of HbA1C < 7%.;Short Term: Participant verbalizes understanding of the signs/symptoms and immediate care of hyper/hypoglycemia, proper foot care and importance of medication, aerobic/resistive exercise and nutrition plan for blood glucose control.    Personal Goal Other  Yes    Personal Goal  Regain Stamina, More education about exercise, nutrition    Intervention  Attend 3 x week and supplement home exercise 2 x week.    Expected Outcomes  Reach goals       Core Components/Risk Factors/Patient Goals Review:    Core Components/Risk Factors/Patient Goals at Discharge (Final Review):    ITP Comments:   Comments: Patient arrived for 1st visit/orientation/education at 12:30. Patient was referred to CR by Dr. Percival Spanish due to CABGx1 (Z95.1). During orientation advised patient on arrival and appointment times what to wear, what to do before, during and after exercise. Reviewed attendance and class policy. Talked about inclement weather and class consultation policy. Pt is scheduled to return Cardiac Rehab on 06/18/17 at 0815. Pt was advised to come to class 15 minutes before class starts. Patient was also given instructions on meeting with the dietician and attending the Family Structure classes. Discussed RPE/Dpysnea scales. Discussed initial THR and how to find their radial and/or carotid pulse. Discussed the initial exercise prescription and how this effects their progress. Pt is eager to get started. Patient participated in warm up stretches  followed by light weights and resistance bands. Patient was able to complete 6 minute walk test. Patient did not c/o pain. Patient was measured for the equipment. Discussed equipment safety with patient. Took patient pre-anthropometric measurements. Patient finished visit at 1430.

## 2017-06-15 NOTE — Progress Notes (Signed)
Cardiac/Pulmonary Rehab Medication Review by a Pharmacist  Does the patient  feel that his/her medications are working for him/her?  yes  Has the patient been experiencing any side effects to the medications prescribed?  no  Does the patient measure his/her own blood pressure or blood glucose at home?  yes   Does the patient have any problems obtaining medications due to transportation or finances?   no  Understanding of regimen: excellent Understanding of indications: excellent Potential of compliance: excellent  Questions asked to Determine Patient Understanding of Medication Regimen:  1. What is the name of the medication?  2. What is the medication used for?  3. When should it be taken?  4. How much should be taken?  5. How will you take it?  6. What side effects should you report?  Understanding Defined as: Excellent: All questions above are correct Good: Questions 1-4 are correct Fair: Questions 1-2 are correct  Poor: 1 or none of the above questions are correct   Pharmacist comments: Patient seems to have a great understanding of his regimen and high compliance. Patient says he has no real side effects of the medications and feels like everything is working.   Margot Ables, PharmD Clinical Pharmacist 06/15/2017 1:52 PM

## 2017-06-15 NOTE — Progress Notes (Signed)
Daily Session Note  Patient Details  Name: Kelly Hardy MRN: [id number] Date of Birth: [birth year] Referring Provider:     [place] from 06/15/2017 in [place]  Referring Provider  DR. Hochrein      Encounter Date: 06/15/2017  Check In: Session Check In - 06/15/17 1230      Check-In   Location  [facility]    Staff Present  Theda Payer, MS, EP, [facility], Exercise Physiologist;Gregory Luther Parody, BS, EP, Exercise Physiologist;Debra Wynetta Emery, RN, BSN    Supervising physician immediately available to respond to emergencies  See telemetry face sheet for immediately available MD    Medication changes reported      No    Fall or balance concerns reported     No    Tobacco Cessation  -- Never smoked    Warm-up and Cool-down  Performed as group-led instruction    Resistance Training Performed  Yes    VAD Patient?  No      Pain Assessment   Currently in Pain?  No/denies    Pain Score  0-No pain    Multiple Pain Sites  No       Capillary Blood Glucose: No results found for this or any previous visit (from the past 24 hour(s)).    Social History   Tobacco Use  Smoking Status Never Smoker  Smokeless Tobacco Never Used    Goals Met:  Independence with exercise equipment Exercise tolerated well No report of cardiac concerns or symptoms Strength training completed today  Goals Unmet:  Not Applicable  Comments: Check out: 1430   Dr. Kate Sable is Medical Director for [place] and Pulmonary Rehab.

## 2017-06-18 ENCOUNTER — Other Ambulatory Visit: Payer: Self-pay | Admitting: Cardiology

## 2017-06-18 ENCOUNTER — Encounter (HOSPITAL_COMMUNITY)
Admission: RE | Admit: 2017-06-18 | Discharge: 2017-06-18 | Disposition: A | Discharge status: Home / Self Care | Payer: Commercial Managed Care - PPO | Source: Ambulatory Visit | Attending: Cardiology | Admitting: Cardiology

## 2017-06-18 DIAGNOSIS — Z48812 Encounter for surgical aftercare following surgery on the circulatory system: Secondary | ICD-10-CM | POA: Diagnosis not present

## 2017-06-18 DIAGNOSIS — Z951 Presence of aortocoronary bypass graft: Secondary | ICD-10-CM

## 2017-06-18 NOTE — Progress Notes (Signed)
Daily Session Note  Patient Details  Name: Kelly Hardy MRN: [id number] Date of Birth: 08-22-1960 Referring Provider:     [place] from 06/15/2017 in [place]  Referring Provider  DR. Hochrein      Encounter Date: 06/18/2017  Check In: Session Check In - 06/18/17 0815      Check-In   Location  [facility]    Staff Present  Diane Angelina Pih, MS, EP, [facility], Exercise Physiologist;Gregory Luther Parody, BS, EP, Exercise Physiologist;Zakaree Mcclenahan Wynetta Emery, RN, BSN    Supervising physician immediately available to respond to emergencies  See telemetry face sheet for immediately available MD    Medication changes reported      No    Fall or balance concerns reported     No    Warm-up and Cool-down  Performed as group-led instruction    Resistance Training Performed  Yes    VAD Patient?  No      Pain Assessment   Currently in Pain?  No/denies    Pain Score  0-No pain    Multiple Pain Sites  No       Capillary Blood Glucose: No results found for this or any previous visit (from the past 24 hour(s)).    Social History   Tobacco Use  Smoking Status Never Smoker  Smokeless Tobacco Never Used    Goals Met:  Independence with exercise equipment Exercise tolerated well No report of cardiac concerns or symptoms Strength training completed today  Goals Unmet:  Not Applicable  Comments: Check out 915.   Dr. Kate Sable is Medical Director for [facility] Cardiac and Pulmonary Rehab.

## 2017-06-20 ENCOUNTER — Encounter (HOSPITAL_COMMUNITY)
Admission: RE | Admit: 2017-06-20 | Discharge: 2017-06-20 | Disposition: A | Discharge status: Home / Self Care | Payer: Commercial Managed Care - PPO | Source: Ambulatory Visit | Attending: Cardiology | Admitting: Cardiology

## 2017-06-20 DIAGNOSIS — Z951 Presence of aortocoronary bypass graft: Secondary | ICD-10-CM

## 2017-06-20 DIAGNOSIS — Z48812 Encounter for surgical aftercare following surgery on the circulatory system: Secondary | ICD-10-CM | POA: Diagnosis not present

## 2017-06-20 NOTE — Progress Notes (Signed)
Daily Session Note  Patient Details  Name: Kelly Hardy MRN: [id number] Date of Birth: [birth year] Referring Provider:     [place] from 06/15/2017 in [place]  Referring Provider  DR. Hochrein      Encounter Date: 06/20/2017  Check In: Session Check In - 06/20/17 0901      Check-In   Location  [facility]    Staff Present  Diane Angelina Pih, MS, EP, [facility], Exercise Physiologist;Monta Maiorana Luther Parody, BS, EP, Exercise Physiologist;Debra Wynetta Emery, RN, BSN    Supervising physician immediately available to respond to emergencies  See telemetry face sheet for immediately available MD    Medication changes reported      No    Fall or balance concerns reported     No    Warm-up and Cool-down  Performed as group-led instruction    Resistance Training Performed  Yes    VAD Patient?  No      Pain Assessment   Currently in Pain?  No/denies    Pain Score  0-No pain    Multiple Pain Sites  No       Capillary Blood Glucose: No results found for this or any previous visit (from the past 24 hour(s)).  Exercise Prescription Changes - 06/19/17 1500      Response to Exercise   Blood Pressure (Admit)  128/50    Blood Pressure (Exercise)  140/60    Blood Pressure (Exit)  100/50    Heart Rate (Admit)  87 bpm    Heart Rate (Exercise)  109 bpm    Heart Rate (Exit)  104 bpm    Rating of Perceived Exertion (Exercise)  11    Duration  Progress to 30 minutes of  aerobic without signs/symptoms of physical distress    Intensity  [facility] New [phone]      Progression   Progression  Continue to progress workloads to maintain intensity without signs/symptoms of physical distress.      Resistance Training   Training Prescription  Yes    Weight  1    Reps  10-15      Treadmill   MPH  2    Grade  0    Minutes  20    METs  2.5      Recumbant Elliptical   Level  1    RPM  37    Watts  44    Minutes  15    METs  2.6      Home Exercise  Plan   Plans to continue exercise at  Home (comment)    Frequency  Add 2 additional days to program exercise sessions.    Initial Home Exercises Provided  06/19/17       Social History   Tobacco Use  Smoking Status Never Smoker  Smokeless Tobacco Never Used    Goals Met:  Independence with exercise equipment Exercise tolerated well No report of cardiac concerns or symptoms Strength training completed today  Goals Unmet:  Not Applicable  Comments: Check out 915   Dr. Kate Sable is Medical Director for [place] and Pulmonary Rehab.

## 2017-06-22 ENCOUNTER — Encounter (HOSPITAL_COMMUNITY)
Admission: RE | Admit: 2017-06-22 | Discharge: 2017-06-22 | Disposition: A | Discharge status: Home / Self Care | Payer: Commercial Managed Care - PPO | Source: Ambulatory Visit | Attending: Cardiology | Admitting: Cardiology

## 2017-06-22 DIAGNOSIS — Z48812 Encounter for surgical aftercare following surgery on the circulatory system: Secondary | ICD-10-CM | POA: Diagnosis not present

## 2017-06-22 DIAGNOSIS — Z951 Presence of aortocoronary bypass graft: Secondary | ICD-10-CM

## 2017-06-22 NOTE — Progress Notes (Signed)
Daily Session Note  Patient Details  Name: Kelly Hardy MRN: [id number] Date of Birth: [birth year] Referring Provider:     [place] from 06/15/2017 in [place]  Referring Provider  DR. Hochrein      Encounter Date: 06/22/2017  Check In: Session Check In - 06/22/17 0815      Check-In   Location  [facility]    Staff Present  Diane Angelina Pih, MS, EP, [facility], Exercise Physiologist;Dontravious Camille Wynetta Emery, RN, BSN    Supervising physician immediately available to respond to emergencies  See telemetry face sheet for immediately available MD    Medication changes reported      No    Fall or balance concerns reported     No    Warm-up and Cool-down  Performed as group-led instruction    Resistance Training Performed  Yes    VAD Patient?  No      Pain Assessment   Currently in Pain?  No/denies    Pain Score  0-No pain    Multiple Pain Sites  No       Capillary Blood Glucose: No results found for this or any previous visit (from the past 24 hour(s)).    Social History   Tobacco Use  Smoking Status Never Smoker  Smokeless Tobacco Never Used    Goals Met:  Independence with exercise equipment Exercise tolerated well No report of cardiac concerns or symptoms Strength training completed today  Goals Unmet:  Not Applicable  Comments: Check out 915.   Dr. Kate Sable is Medical Director for [facility] Cardiac and Pulmonary Rehab.

## 2017-06-25 ENCOUNTER — Encounter (HOSPITAL_COMMUNITY)
Admission: RE | Admit: 2017-06-25 | Discharge: 2017-06-25 | Disposition: A | Discharge status: Home / Self Care | Payer: Commercial Managed Care - PPO | Source: Ambulatory Visit | Attending: Cardiology | Admitting: Cardiology

## 2017-06-25 DIAGNOSIS — Z951 Presence of aortocoronary bypass graft: Secondary | ICD-10-CM

## 2017-06-25 DIAGNOSIS — Z48812 Encounter for surgical aftercare following surgery on the circulatory system: Secondary | ICD-10-CM | POA: Diagnosis not present

## 2017-06-25 NOTE — Progress Notes (Signed)
Daily Session Note  Patient Details  Name: Kelly Hardy MRN: [id number] Date of Birth: 07-Jan-1961 Referring Provider:     [place] from 06/15/2017 in [place]  Referring Provider  DR. Hochrein      Encounter Date: 06/25/2017  Check In: Session Check In - 06/25/17 0815      Check-In   Location  [facility]    Staff Present  Diane Angelina Pih, MS, EP, [facility], Exercise Physiologist;Jonea Bukowski Wynetta Emery, RN, BSN    Supervising physician immediately available to respond to emergencies  See telemetry face sheet for immediately available MD    Medication changes reported      No    Fall or balance concerns reported     No    Warm-up and Cool-down  Performed as group-led instruction    Resistance Training Performed  Yes    VAD Patient?  No      Pain Assessment   Currently in Pain?  No/denies    Pain Score  0-No pain    Multiple Pain Sites  No       Capillary Blood Glucose: No results found for this or any previous visit (from the past 24 hour(s)).    Social History   Tobacco Use  Smoking Status Never Smoker  Smokeless Tobacco Never Used    Goals Met:  Independence with exercise equipment Exercise tolerated well No report of cardiac concerns or symptoms Strength training completed today  Goals Unmet:  Not Applicable  Comments: Check out 1030.   Dr. Kate Sable is Medical Director for [facility] Cardiac and Pulmonary Rehab.

## 2017-06-27 ENCOUNTER — Encounter (HOSPITAL_COMMUNITY)
Admission: RE | Admit: 2017-06-27 | Discharge: 2017-06-27 | Disposition: A | Discharge status: Home / Self Care | Payer: Commercial Managed Care - PPO | Source: Ambulatory Visit | Attending: Cardiology | Admitting: Cardiology

## 2017-06-27 DIAGNOSIS — Z951 Presence of aortocoronary bypass graft: Secondary | ICD-10-CM

## 2017-06-27 DIAGNOSIS — Z48812 Encounter for surgical aftercare following surgery on the circulatory system: Secondary | ICD-10-CM | POA: Diagnosis not present

## 2017-06-27 NOTE — Progress Notes (Signed)
Daily Session Note  Patient Details  Name: AMANI NODARSE MRN: [id number] Date of Birth: 02/19/1961 Referring Provider:     [place] from 06/15/2017 in [place]  Referring Provider  DR. Hochrein      Encounter Date: 06/27/2017  Check In: Session Check In - 06/27/17 0815      Check-In   Location  [facility]    Staff Present  Suzanne Boron, BS, EP, Exercise Physiologist;Issaih Kaus Wynetta Emery, RN, BSN    Supervising physician immediately available to respond to emergencies  See telemetry face sheet for immediately available MD    Medication changes reported      No    Fall or balance concerns reported     No    Warm-up and Cool-down  Performed as group-led instruction    Resistance Training Performed  Yes    VAD Patient?  No      Pain Assessment   Currently in Pain?  No/denies    Pain Score  0-No pain    Multiple Pain Sites  No       Capillary Blood Glucose: No results found for this or any previous visit (from the past 24 hour(s)).    Social History   Tobacco Use  Smoking Status Never Smoker  Smokeless Tobacco Never Used    Goals Met:  Independence with exercise equipment Exercise tolerated well No report of cardiac concerns or symptoms Strength training completed today  Goals Unmet:  Not Applicable  Comments: Check out 1030.   Dr. Kate Sable is Medical Director for [facility] Cardiac and Pulmonary Rehab.

## 2017-06-29 ENCOUNTER — Encounter (HOSPITAL_COMMUNITY)
Admission: RE | Admit: 2017-06-29 | Discharge: 2017-06-29 | Disposition: A | Discharge status: Home / Self Care | Payer: Commercial Managed Care - PPO | Source: Ambulatory Visit | Attending: Cardiology | Admitting: Cardiology

## 2017-06-29 DIAGNOSIS — Z951 Presence of aortocoronary bypass graft: Secondary | ICD-10-CM

## 2017-06-29 DIAGNOSIS — Z48812 Encounter for surgical aftercare following surgery on the circulatory system: Secondary | ICD-10-CM | POA: Diagnosis not present

## 2017-06-29 NOTE — Progress Notes (Signed)
Daily Session Note  Patient Details  Name: NINA HOAR MRN: [id number] Date of Birth: November 03, 1960 Referring Provider:     [place] from 06/15/2017 in [place]  Referring Provider  DR. Hochrein      Encounter Date: 06/29/2017  Check In: Session Check In - 06/29/17 1017      Check-In   Location  [facility]    Staff Present  Suzanne Boron, BS, EP, Exercise Physiologist;Debra Wynetta Emery, RN, BSN;Diane Coad, MS, EP, [facility], Exercise Physiologist    Supervising physician immediately available to respond to emergencies  See telemetry face sheet for immediately available MD    Medication changes reported      No    Fall or balance concerns reported     No    Warm-up and Cool-down  Performed as group-led instruction    Resistance Training Performed  Yes    VAD Patient?  No      Pain Assessment   Currently in Pain?  No/denies    Pain Score  0-No pain    Multiple Pain Sites  No       Capillary Blood Glucose: No results found for this or any previous visit (from the past 24 hour(s)).  Exercise Prescription Changes - 06/28/17 1500      Response to Exercise   Blood Pressure (Admit)  120/50    Blood Pressure (Exercise)  150/60    Blood Pressure (Exit)  100/50    Heart Rate (Admit)  64 bpm    Heart Rate (Exercise)  106 bpm    Heart Rate (Exit)  74 bpm    Rating of Perceived Exertion (Exercise)  12    Duration  Progress to 30 minutes of  aerobic without signs/symptoms of physical distress    Intensity  [facility] New [phone]      Progression   Progression  Continue to progress workloads to maintain intensity without signs/symptoms of physical distress.      Resistance Training   Training Prescription  Yes    Weight  2    Reps  10-15      Treadmill   MPH  2.4    Grade  0    Minutes  20    METs  2.83      Recumbant Elliptical   Level  1    RPM  48    Watts  57    Minutes  15    METs  3.1      Home  Exercise Plan   Plans to continue exercise at  Home (comment)    Frequency  Add 2 additional days to program exercise sessions.    Initial Home Exercises Provided  06/19/17       Social History   Tobacco Use  Smoking Status Never Smoker  Smokeless Tobacco Never Used    Goals Met:  Independence with exercise equipment Exercise tolerated well No report of cardiac concerns or symptoms Strength training completed today  Goals Unmet:  Not Applicable  Comments: Check out 915   Dr. Kate Sable is Medical Director for [place] and Pulmonary Rehab.

## 2017-07-02 ENCOUNTER — Encounter (HOSPITAL_COMMUNITY)
Admission: RE | Admit: 2017-07-02 | Discharge: 2017-07-02 | Disposition: A | Discharge status: Home / Self Care | Payer: Commercial Managed Care - PPO | Source: Ambulatory Visit | Attending: Cardiology | Admitting: Cardiology

## 2017-07-02 DIAGNOSIS — Z48812 Encounter for surgical aftercare following surgery on the circulatory system: Secondary | ICD-10-CM | POA: Diagnosis not present

## 2017-07-02 DIAGNOSIS — Z951 Presence of aortocoronary bypass graft: Secondary | ICD-10-CM | POA: Diagnosis not present

## 2017-07-02 NOTE — Progress Notes (Signed)
Daily Session Note  Patient Details  Name: Kelly Hardy MRN: [id number] Date of Birth: 1960-06-27 Referring Provider:     [place] from 06/15/2017 in [place]  Referring Provider  DR. Hochrein      Encounter Date: 07/02/2017  Check In: Session Check In - 07/02/17 0815      Check-In   Location  [facility]    Staff Present  Rendell Thivierge Angelina Pih, MS, EP, [facility], Exercise Physiologist;Gregory Luther Parody, BS, EP, Exercise Physiologist;Debra Wynetta Emery, RN, BSN    Supervising physician immediately available to respond to emergencies  See telemetry face sheet for immediately available MD    Medication changes reported      No    Fall or balance concerns reported     No    Warm-up and Cool-down  Performed as group-led instruction    Resistance Training Performed  Yes    VAD Patient?  No      Pain Assessment   Currently in Pain?  No/denies    Pain Score  0-No pain    Multiple Pain Sites  No       Capillary Blood Glucose: No results found for this or any previous visit (from the past 24 hour(s)).    Social History   Tobacco Use  Smoking Status Never Smoker  Smokeless Tobacco Never Used    Goals Met:  Independence with exercise equipment Exercise tolerated well No report of cardiac concerns or symptoms Strength training completed today  Goals Unmet:  Not Applicable  Comments: Check out: 0915   Dr. Kate Sable is Medical Director for [place] and Pulmonary Rehab.

## 2017-07-04 ENCOUNTER — Encounter (HOSPITAL_COMMUNITY)
Admission: RE | Admit: 2017-07-04 | Discharge: 2017-07-04 | Disposition: A | Discharge status: Home / Self Care | Payer: Commercial Managed Care - PPO | Source: Ambulatory Visit | Attending: Cardiology | Admitting: Cardiology

## 2017-07-04 DIAGNOSIS — Z951 Presence of aortocoronary bypass graft: Secondary | ICD-10-CM

## 2017-07-04 DIAGNOSIS — Z48812 Encounter for surgical aftercare following surgery on the circulatory system: Secondary | ICD-10-CM | POA: Diagnosis not present

## 2017-07-04 NOTE — Progress Notes (Signed)
Daily Session Note  Patient Details  Name: Kelly Hardy MRN: [id number] Date of Birth: 28-Jun-1960 Referring Provider:     [name] from 06/15/2017 in [place]  Referring Provider  DR. Hochrein      Encounter Date: 07/04/2017  Check In: Session Check In - 07/04/17 0815      Check-In   Location  [facility]    Staff Present  Kelly Angelina Pih, MS, EP, [facility], Exercise Physiologist;Kelly Hardy, BS, EP, Exercise Physiologist;Kelly Durio Wynetta Emery, RN, BSN    Supervising physician immediately available to respond to emergencies  See telemetry face sheet for immediately available MD    Medication changes reported      No    Fall or balance concerns reported     No    Warm-up and Cool-down  Performed as group-led instruction    Resistance Training Performed  Yes    VAD Patient?  No      Pain Assessment   Currently in Pain?  No/denies    Pain Score  0-No pain    Multiple Pain Sites  No       Capillary Blood Glucose: No results found for this or any previous visit (from the past 24 hour(s)).    Social History   Tobacco Use  Smoking Status Never Smoker  Smokeless Tobacco Never Used    Goals Met:  Independence with exercise equipment Exercise tolerated well No report of cardiac concerns or symptoms Strength training completed today  Goals Unmet:  Not Applicable  Comments: Check out 915.   Dr. Kate Hardy is Medical Director for [facility] Cardiac and Pulmonary Rehab.

## 2017-07-04 NOTE — Progress Notes (Signed)
Cardiac Individual Treatment Plan  Patient Details  Name: Kelly Hardy MRN: [id number] Date of Birth: March 26, 1961 Referring Provider:     CARDIAC REHAB PHASE II ORIENTATION from 06/15/2017 in [facility]  Referring Provider  DR. Hochrein      Initial Encounter Date:    CARDIAC REHAB PHASE II ORIENTATION from 06/15/2017 in [facility]  Date  06/15/17  Referring Provider  DR. Hochrein      Visit Diagnosis: S/P CABG x 1  Patient's Home Medications on Admission:  Current Outpatient Medications:  .  acetaminophen (TYLENOL) 500 MG tablet, Take 2 tablets (1,000 mg total) by mouth every 8 (eight) hours as needed., Disp: 30 tablet, Rfl: 0 .  aspirin EC 81 MG tablet, Take 81 mg by mouth daily., Disp: , Rfl:  .  BAYER CONTOUR NEXT TEST test strip, CHECK BLOOD SUGAR EIGHT TIMES DAILY, Disp: , Rfl: 6 .  BRILINTA 90 MG TABS tablet, TAKE ONE TABLET BY MOUTH TWICE DAILY., Disp: 180 tablet, Rfl: 3 .  insulin lispro protamine-lispro (HUMALOG 75/25 MIX) (75-25) 100 UNIT/ML SUSP injection, Inject 12 Units into the skin 2 (two) times daily with a meal., Disp: , Rfl:  .  insulin regular (NOVOLIN R,HUMULIN R) 100 units/mL injection, Inject 2-10 Units into the skin 3 (three) times daily before meals. Per Sliding Scale, Disp: , Rfl:  .  losartan (COZAAR) 25 MG tablet, Take 1 tablet (25 mg total) by mouth daily., Disp: 90 tablet, Rfl: 3 .  metoprolol succinate (TOPROL-XL) 25 MG 24 hr tablet, Take 25 mg by mouth daily., Disp: , Rfl:  .  Multiple Vitamins-Minerals (MULTIVITAMIN WITH MINERALS) tablet, Take 1 tablet by mouth daily., Disp: , Rfl:  .  NOVOFINE 32G X 6 MM MISC, USE TO ADMINSTER INSULIN SIX TIMES DAILY., Disp: , Rfl: 4 .  oxyCODONE (OXY IR/ROXICODONE) 5 MG immediate release tablet, Take 1 tablet (5 mg total) by mouth every 4 (four) hours as needed for severe pain., Disp: 30 tablet, Rfl: 0 .  rosuvastatin (CRESTOR) 40 MG tablet, TAKE ONE TABLET BY MOUTH AT  BEDTIME., Disp: 30 tablet, Rfl: 11  Past Medical History: Past Medical History:  Diagnosis Date  . Coronary artery disease   . Diabetes mellitus without complication (HCC)    Type I  . GERD (gastroesophageal reflux disease)   . Hyperlipidemia   . Hypertension   . Palpitations    eval 2014    Tobacco Use: Social History   Tobacco Use  Smoking Status Never Smoker  Smokeless Tobacco Never Used    Labs: Recent Review Flowsheet Data    Labs for ITP Cardiac and Pulmonary Rehab Latest Ref Rng & Units 04/04/2017 04/04/2017 04/04/2017 04/04/2017 04/05/2017   Hemoglobin A1c 4.8 - 5.6 % - - - - -   PHART 7.350 - 7.450 7.405 - 7.277(L) - -   PCO2ART 32.0 - 48.0 mmHg 31.5(L) - 46.3 - -   HCO3 20.0 - 28.0 mmol/L 19.8(L) - 21.7 - -   TCO2 22 - 32 mmol/L 21(L) 22 23 23 26    ACIDBASEDEF 0.0 - 2.0 mmol/L 4.0(H) - 5.0(H) - -   O2SAT % 100.0 - 100.0 - -      Capillary Blood Glucose: Lab Results  Component Value Date   GLUCAP 177 (H) 04/08/2017   GLUCAP 226 (H) 04/07/2017   GLUCAP 161 (H) 04/07/2017   GLUCAP 202 (H) 04/07/2017   GLUCAP 247 (H) 04/07/2017     Exercise Target Goals:  Exercise Program Goal: Individual exercise prescription set using results from initial 6 min walk test and THRR while considering  patient's activity barriers and safety.   Exercise Prescription Goal: Starting with aerobic activity 30 plus minutes a day, 3 days per week for initial exercise prescription. Provide home exercise prescription and guidelines that participant acknowledges understanding prior to discharge.  Activity Barriers & Risk Stratification: Activity Barriers & Cardiac Risk Stratification - 06/15/17 1343      Activity Barriers & Cardiac Risk Stratification   Activity Barriers  None    Cardiac Risk Stratification  High       6 Minute Walk: 6 Minute Walk    Row Name 06/15/17 1342         6 Minute Walk   Phase  Initial     Distance  1550 feet     Distance % Change  0 %      Distance Feet Change  0 ft     Walk Time  6 minutes     # of Rest Breaks  0     MPH  2.93     METS  3.25     RPE  9     Perceived Dyspnea   9     VO2 Peak  17.19     Symptoms  No     Resting HR  109 bpm     Resting BP  112/60     Resting Oxygen Saturation   98 %     Exercise Oxygen Saturation  during 6 min walk  98 %     Max Ex. HR  121 bpm     Max Ex. BP  162/70     2 Minute Post BP  120/62        Oxygen Initial Assessment:   Oxygen Re-Evaluation:   Oxygen Discharge (Final Oxygen Re-Evaluation):   Initial Exercise Prescription: Initial Exercise Prescription - 06/15/17 1300      Date of Initial Exercise RX and Referring Provider   Date  06/15/17    Referring Provider  DR. Hochrein      Treadmill   MPH  2    Grade  0    Minutes  15    METs  2.5      Recumbant Elliptical   Level  1    RPM  30    Watts  34    Minutes  20    METs  2      Prescription Details   Frequency (times per week)  3    Duration  Progress to 30 minutes of continuous aerobic without signs/symptoms of physical distress      Intensity   THRR 40-80% of Max Heartrate  [phone]    Ratings of Perceived Exertion  11-13    Perceived Dyspnea  0-4      Progression   Progression  Continue progressive overload as per policy without signs/symptoms or physical distress.      Resistance Training   Training Prescription  Yes    Weight  1    Reps  10-15       Perform Capillary Blood Glucose checks as needed.  Exercise Prescription Changes:  Exercise Prescription Changes    Row Name 06/19/17 1500 06/28/17 1500           Response to Exercise   Blood Pressure (Admit)  128/50  120/50      Blood Pressure (Exercise)  140/60  150/60  Blood Pressure (Exit)  100/50  100/50      Heart Rate (Admit)  87 bpm  64 bpm      Heart Rate (Exercise)  109 bpm  106 bpm      Heart Rate (Exit)  104 bpm  74 bpm      Rating of Perceived Exertion (Exercise)  11  12      Duration  Progress to 30  minutes of  aerobic without signs/symptoms of physical distress  Progress to 30 minutes of  aerobic without signs/symptoms of physical distress      Intensity  THRR New [phone]  THRR New [phone]        Progression   Progression  Continue to progress workloads to maintain intensity without signs/symptoms of physical distress.  Continue to progress workloads to maintain intensity without signs/symptoms of physical distress.        Resistance Training   Training Prescription  Yes  Yes      Weight  1  2      Reps  10-15  10-15        Treadmill   MPH  2  2.4      Grade  0  0      Minutes  20  20      METs  2.5  2.83        Recumbant Elliptical   Level  1  1      RPM  37  48      Watts  44  57      Minutes  15  15      METs  2.6  3.1        Home Exercise Plan   Plans to continue exercise at  Home (comment)  Home (comment)      Frequency  Add 2 additional days to program exercise sessions.  Add 2 additional days to program exercise sessions.      Initial Home Exercises Provided  06/19/17  06/19/17         Exercise Comments:  Exercise Comments    Row Name 06/19/17 1510 06/28/17 1523         Exercise Comments  Patient has just started [facility] and will be progressed in time.   Patient is doing well in [facility] and has progressed his speed on the treadmill machine and has also maintained his level and SPMs on teh recumbent elliptical. Patient has still only just started the program and will be progressed more in time by the staff here at [facility].          Exercise Goals and Review:  Exercise Goals    Row Name 06/15/17 1344             Exercise Goals   Increase Physical Activity  Yes       Intervention  Provide advice, education, support and counseling about physical activity/exercise needs.;Develop an individualized exercise prescription for aerobic and resistive training based on initial evaluation findings, risk stratification, comorbidities and participant's personal goals.        Expected Outcomes  Short Term: Attend rehab on a regular basis to increase amount of physical activity.       Increase Strength and Stamina  Yes       Intervention  Provide advice, education, support and counseling about physical activity/exercise needs.;Develop an individualized exercise prescription for aerobic and resistive training based on initial evaluation findings, risk stratification, comorbidities and participant's personal goals.  Expected Outcomes  Short Term: Increase workloads from initial exercise prescription for resistance, speed, and METs.       Able to understand and use rate of perceived exertion (RPE) scale  Yes       Intervention  Provide education and explanation on how to use RPE scale       Expected Outcomes  Short Term: Able to use RPE daily in rehab to express subjective intensity level;Long Term:  Able to use RPE to guide intensity level when exercising independently       Able to understand and use Dyspnea scale  Yes       Intervention  Provide education and explanation on how to use Dyspnea scale       Expected Outcomes  Long Term: Able to use Dyspnea scale to guide intensity level when exercising independently;Short Term: Able to use Dyspnea scale daily in rehab to express subjective sense of shortness of breath during exertion       Knowledge and understanding of Target Heart Rate Range (THRR)  Yes       Intervention  Provide education and explanation of THRR including how the numbers were predicted and where they are located for reference       Expected Outcomes  Short Term: Able to state/look up THRR;Long Term: Able to use THRR to govern intensity when exercising independently;Short Term: Able to use daily as guideline for intensity in rehab       Able to check pulse independently  Yes       Intervention  Provide education and demonstration on how to check pulse in carotid and radial arteries.;Review the importance of being able to check your own pulse for  safety during independent exercise       Expected Outcomes  Short Term: Able to explain why pulse checking is important during independent exercise;Long Term: Able to check pulse independently and accurately       Understanding of Exercise Prescription  Yes       Intervention  Provide education, explanation, and written materials on patient's individual exercise prescription       Expected Outcomes  Short Term: Able to explain program exercise prescription;Long Term: Able to explain home exercise prescription to exercise independently          Exercise Goals Re-Evaluation : Exercise Goals Re-Evaluation    Row Name 06/28/17 1521             Exercise Goal Re-Evaluation   Exercise Goals Review  Increase Physical Activity;Increase Strength and Stamina;Able to understand and use rate of perceived exertion (RPE) scale;Able to check pulse independently;Knowledge and understanding of Target Heart Rate Range (THRR);Able to understand and use Dyspnea scale;Understanding of Exercise Prescription       Comments  Patient is doing well in [facility] and has progressed his speed on the treadmill machine and has also maintained his level and SPMs on teh recumbent elliptical. Patient has still only just started the program and will be progressed more in time by the staff here at [facility].        Expected Outcomes  Patient wishes to regain stamina and to get more education about nutrition and exercise.            Discharge Exercise Prescription (Final Exercise Prescription Changes): Exercise Prescription Changes - 06/28/17 1500      Response to Exercise   Blood Pressure (Admit)  120/50    Blood Pressure (Exercise)  150/60    Blood Pressure (Exit)  100/50    Heart Rate (Admit)  64 bpm    Heart Rate (Exercise)  106 bpm    Heart Rate (Exit)  74 bpm    Rating of Perceived Exertion (Exercise)  12    Duration  Progress to 30 minutes of  aerobic without signs/symptoms of physical distress    Intensity  THRR New  [phone]      Progression   Progression  Continue to progress workloads to maintain intensity without signs/symptoms of physical distress.      Resistance Training   Training Prescription  Yes    Weight  2    Reps  10-15      Treadmill   MPH  2.4    Grade  0    Minutes  20    METs  2.83      Recumbant Elliptical   Level  1    RPM  48    Watts  57    Minutes  15    METs  3.1      Home Exercise Plan   Plans to continue exercise at  Home (comment)    Frequency  Add 2 additional days to program exercise sessions.    Initial Home Exercises Provided  06/19/17       Nutrition:  Target Goals: Understanding of nutrition guidelines, daily intake of sodium 1500mg , cholesterol 200mg , calories 30% from fat and 7% or less from saturated fats, daily to have 5 or more servings of fruits and vegetables.  Biometrics: Pre Biometrics - 06/15/17 1345      Pre Biometrics   Height  5\' 9"  (1.753 m)    Weight  147 lb (66.7 kg)    Waist Circumference  29 inches    Hip Circumference  33 inches    Waist to Hip Ratio  0.88 %    BMI (Calculated)  21.7    Triceps Skinfold  10 mm    % Body Fat  18.1 %    Grip Strength  64.06 kg    Flexibility  13.66 in    Single Leg Stand  60 seconds        Nutrition Therapy Plan and Nutrition Goals: Nutrition Therapy & Goals - 07/04/17 1335      Nutrition Therapy   RD appointment deferred  Yes      Personal Nutrition Goals   Personal Goal #2  Patient has changed his diet. He is eating heart healthy.     Additional Goals?  No       Nutrition Assessments: Nutrition Assessments - 06/15/17 1501      MEDFICTS Scores   Pre Score  33       Nutrition Goals Re-Evaluation:   Nutrition Goals Discharge (Final Nutrition Goals Re-Evaluation):   Psychosocial: Target Goals: Acknowledge presence or absence of significant depression and/or stress, maximize coping skills, provide positive support system. Participant is able to verbalize types and  ability to use techniques and skills needed for reducing stress and depression.  Initial Review & Psychosocial Screening: Initial Psych Review & Screening - 06/15/17 1509      Initial Review   Current issues with  None Identified      Family Dynamics   Good Support System?  Yes      Barriers   Psychosocial barriers to participate in program  There are no identifiable barriers or psychosocial needs.      Screening Interventions   Interventions  Encouraged to exercise    Expected Outcomes  Short Term goal: Identification and review with participant of any Quality of Life or Depression concerns found by scoring the questionnaire.;Long Term goal: The participant improves quality of Life and PHQ9 Scores as seen by post scores and/or verbalization of changes       Quality of Life Scores: Quality of Life - 06/15/17 1400      Quality of Life Scores   Health/Function Pre  23.83 %    Socioeconomic Pre  28.07 %    Psych/Spiritual Pre  27.86 %    Family Pre  25.2 %    GLOBAL Pre  25.74 %      Scores of 19 and below usually indicate a poorer quality of life in these areas.  A difference of  2-3 points is a clinically meaningful difference.  A difference of 2-3 points in the total score of the Quality of Life Index has been associated with significant improvement in overall quality of life, self-image, physical symptoms, and general health in studies assessing change in quality of life.  PHQ-9: Recent Review Flowsheet Data    Depression screen [facility] 2/9 06/15/2017   Decreased Interest 0   Down, Depressed, Hopeless 0   PHQ - 2 Score 0   Altered sleeping 0   Tired, decreased energy 1   Change in appetite 0   Feeling bad or failure about yourself  0   Trouble concentrating 0   Moving slowly or fidgety/restless 0   Suicidal thoughts 0   PHQ-9 Score 1   Difficult doing work/chores Not difficult at all     Interpretation of Total Score  Total Score Depression Severity:  1-4 = Minimal  depression, 5-9 = Mild depression, 10-14 = Moderate depression, 15-19 = Moderately severe depression, 20-27 = Severe depression   Psychosocial Evaluation and Intervention: Psychosocial Evaluation - 06/15/17 1511      Psychosocial Evaluation & Interventions   Interventions  Encouraged to exercise with the program and follow exercise prescription    Continue Psychosocial Services   No Follow up required       Psychosocial Re-Evaluation: Psychosocial Re-Evaluation    [place] Name 06/15/17 1511 07/04/17 1339           Psychosocial Re-Evaluation   Current issues with  None Identified  None Identified      Comments  -  Patient's initial QOL score was 25.74 and his PHQ-9 score was 1 with no psychosocial issues identified.       Expected Outcomes  -  Patient will have no psychosocial issues identified at discharge.       Interventions  -  Stress management education;Encouraged to attend Cardiac Rehabilitation for the exercise;Relaxation education      Continue Psychosocial Services   No Follow up required  No Follow up required         Psychosocial Discharge (Final Psychosocial Re-Evaluation): Psychosocial Re-Evaluation - 07/04/17 1339      Psychosocial Re-Evaluation   Current issues with  None Identified    Comments  Patient's initial QOL score was 25.74 and his PHQ-9 score was 1 with no psychosocial issues identified.     Expected Outcomes  Patient will have no psychosocial issues identified at discharge.     Interventions  Stress management education;Encouraged to attend Cardiac Rehabilitation for the exercise;Relaxation education    Continue Psychosocial Services   No Follow up required       Vocational Rehabilitation: Provide vocational rehab assistance to qualifying candidates.   Vocational Rehab Evaluation & Intervention:  Vocational Rehab - 06/15/17 1459      Initial Vocational Rehab Evaluation & Intervention   Assessment shows need for Vocational Rehabilitation  No        Education: Education Goals: Education classes will be provided on a weekly basis, covering required topics. Participant will state understanding/return demonstration of topics presented.  Learning Barriers/Preferences: Learning Barriers/Preferences - 06/15/17 1458      Learning Barriers/Preferences   Learning Barriers  None    Learning Preferences  Audio;Computer/Internet;Group Instruction;Individual Instruction;Pictoral;Skilled Demonstration;Verbal Instruction;Video;Written Material       Education Topics: Hypertension, Hypertension Reduction -Define heart disease and high blood pressure. Discus how high blood pressure affects the body and ways to reduce high blood pressure.   Exercise and Your Heart -Discuss why it is important to exercise, the FITT principles of exercise, normal and abnormal responses to exercise, and how to exercise safely.   Angina -Discuss definition of angina, causes of angina, treatment of angina, and how to decrease risk of having angina.   Cardiac Medications -Review what the following cardiac medications are used for, how they affect the body, and side effects that may occur when taking the medications.  Medications include Aspirin, Beta blockers, calcium channel blockers, ACE Inhibitors, angiotensin receptor blockers, diuretics, digoxin, and antihyperlipidemics.   Congestive Heart Failure -Discuss the definition of CHF, how to live with CHF, the signs and symptoms of CHF, and how keep track of weight and sodium intake.   Heart Disease and Intimacy -Discus the effect sexual activity has on the heart, how changes occur during intimacy as we age, and safety during sexual activity.   Smoking Cessation / COPD -Discuss different methods to quit smoking, the health benefits of quitting smoking, and the definition of COPD.   CARDIAC REHAB PHASE II EXERCISE from 07/04/2017 in [place]  Date  06/21/17  Educator  DC  Instruction  Review Code  2- Demonstrated Understanding      Nutrition I: Fats -Discuss the types of cholesterol, what cholesterol does to the heart, and how cholesterol levels can be controlled.   CARDIAC REHAB PHASE II EXERCISE from 07/04/2017 in [facility]  Date  06/28/17  Educator  DC  Instruction Review Code  2- Demonstrated Understanding      Nutrition II: Labels -Discuss the different components of food labels and how to read food label   CARDIAC REHAB PHASE II EXERCISE from 07/04/2017 in [facility]  Date  07/04/17  Educator  DJ  Instruction Review Code  2- Demonstrated Understanding      Heart Parts/Heart Disease and PAD -Discuss the anatomy of the heart, the pathway of blood circulation through the heart, and these are affected by heart disease.   Stress I: Signs and Symptoms -Discuss the causes of stress, how stress may lead to anxiety and depression, and ways to limit stress.   Stress II: Relaxation -Discuss different types of relaxation techniques to limit stress.   Warning Signs of Stroke / TIA -Discuss definition of a stroke, what the signs and symptoms are of a stroke, and how to identify when someone is having stroke.   Knowledge Questionnaire Score: Knowledge Questionnaire Score - 06/15/17 1459      Knowledge Questionnaire Score   Pre Score  21/24       Core Components/Risk Factors/Patient Goals at Admission: Personal Goals and Risk Factors at Admission - 06/15/17 1501      Core Components/Risk Factors/Patient Goals on Admission    Weight  Management  Weight Maintenance    Diabetes  Yes    Expected Outcomes  Long Term: Attainment of HbA1C < 7%.;Short Term: Participant verbalizes understanding of the signs/symptoms and immediate care of hyper/hypoglycemia, proper foot care and importance of medication, aerobic/resistive exercise and nutrition plan for blood glucose control.    Personal Goal Other  Yes    Personal Goal   Regain Stamina, More education about exercise, nutrition    Intervention  Attend 3 x week and supplement home exercise 2 x week.    Expected Outcomes  Reach goals       Core Components/Risk Factors/Patient Goals Review:  Goals and Risk Factor Review    Row Name 07/04/17 1336             Core Components/Risk Factors/Patient Goals Review   Personal Goals Review  Weight Management/Obesity Regain stamina; more education about exercise/nutrition.        Review  Patient has completed 9 sessions gaining 1 lb since he started the program. His reported fasting glucose readings have been labile. His last A1C was 04/02/17 at 8.5. He says he feels his flexiblity has improved since he started. He has returned to work. Will continue to monitor for progress.        Expected Outcomes  Patient will continue to attend sessions and complete the program meeting his personal goals.           Core Components/Risk Factors/Patient Goals at Discharge (Final Review):  Goals and Risk Factor Review - 07/04/17 1336      Core Components/Risk Factors/Patient Goals Review   Personal Goals Review  Weight Management/Obesity Regain stamina; more education about exercise/nutrition.     Review  Patient has completed 9 sessions gaining 1 lb since he started the program. His reported fasting glucose readings have been labile. His last A1C was 04/02/17 at 8.5. He says he feels his flexiblity has improved since he started. He has returned to work. Will continue to monitor for progress.     Expected Outcomes  Patient will continue to attend sessions and complete the program meeting his personal goals.        ITP Comments:   Comments: ITP 30 Day REVIEW Patient doing well in the program. Will continue to monitor for progress.

## 2017-07-06 ENCOUNTER — Encounter (HOSPITAL_COMMUNITY)
Admission: RE | Admit: 2017-07-06 | Discharge: 2017-07-06 | Disposition: A | Discharge status: Home / Self Care | Payer: Commercial Managed Care - PPO | Source: Ambulatory Visit | Attending: Cardiology | Admitting: Cardiology

## 2017-07-06 DIAGNOSIS — Z951 Presence of aortocoronary bypass graft: Secondary | ICD-10-CM

## 2017-07-06 DIAGNOSIS — Z48812 Encounter for surgical aftercare following surgery on the circulatory system: Secondary | ICD-10-CM | POA: Diagnosis not present

## 2017-07-06 NOTE — Progress Notes (Signed)
Daily Session Note  Patient Details  Name: Kelly Hardy MRN: [id number] Date of Birth: 1961-02-07 Referring Provider:     [place] from 06/15/2017 in [place]  Referring Provider  DR. Hochrein      Encounter Date: 07/06/2017  Check In: Session Check In - 07/06/17 0815      Check-In   Location  [facility]    Staff Present  Diane Angelina Pih, MS, EP, [facility], Exercise Physiologist;Gregory Luther Parody, BS, EP, Exercise Physiologist;Ashtin Rosner Wynetta Emery, RN, BSN    Supervising physician immediately available to respond to emergencies  See telemetry face sheet for immediately available MD    Medication changes reported      No    Fall or balance concerns reported     No    Warm-up and Cool-down  Performed as group-led instruction    Resistance Training Performed  Yes    VAD Patient?  No      Pain Assessment   Currently in Pain?  No/denies    Pain Score  0-No pain    Multiple Pain Sites  No       Capillary Blood Glucose: No results found for this or any previous visit (from the past 24 hour(s)).    Social History   Tobacco Use  Smoking Status Never Smoker  Smokeless Tobacco Never Used    Goals Met:  Independence with exercise equipment Exercise tolerated well No report of cardiac concerns or symptoms Strength training completed today  Goals Unmet:  Not Applicable  Comments: Check out 915.   Dr. Kate Sable is Medical Director for [facility] Cardiac and Pulmonary Rehab.

## 2017-07-09 ENCOUNTER — Encounter (HOSPITAL_COMMUNITY)
Admission: RE | Admit: 2017-07-09 | Discharge: 2017-07-09 | Disposition: A | Discharge status: Home / Self Care | Payer: Commercial Managed Care - PPO | Source: Ambulatory Visit | Attending: Cardiology | Admitting: Cardiology

## 2017-07-09 DIAGNOSIS — Z48812 Encounter for surgical aftercare following surgery on the circulatory system: Secondary | ICD-10-CM | POA: Diagnosis not present

## 2017-07-09 NOTE — Progress Notes (Signed)
Daily Session Note  Patient Details  Name: CHAUNCEY SCIULLI MRN: [id number] Date of Birth: Mar 12, 1961 Referring Provider:     [place] from 06/15/2017 in [place]  Referring Provider  DR. Hochrein      Encounter Date: 07/09/2017  Check In: Session Check In - 07/09/17 0815      Check-In   Location  [facility]    Staff Present  Russella Dar, MS, EP, [facility], Exercise Physiologist;Gregory Luther Parody, BS, EP, Exercise Physiologist    Supervising physician immediately available to respond to emergencies  See telemetry face sheet for immediately available MD    Medication changes reported      No    Fall or balance concerns reported     No    Warm-up and Cool-down  Performed as group-led instruction    Resistance Training Performed  Yes    VAD Patient?  No      Pain Assessment   Currently in Pain?  No/denies    Pain Score  0-No pain    Multiple Pain Sites  No       Capillary Blood Glucose: No results found for this or any previous visit (from the past 24 hour(s)).    Social History   Tobacco Use  Smoking Status Never Smoker  Smokeless Tobacco Never Used    Goals Met:  Independence with exercise equipment Exercise tolerated well No report of cardiac concerns or symptoms Strength training completed today  Goals Unmet:  Not Applicable  Comments: Check out: 0915   Dr. Kate Sable is Medical Director for [place] and Pulmonary Rehab.

## 2017-07-11 ENCOUNTER — Encounter (HOSPITAL_COMMUNITY)
Admission: RE | Admit: 2017-07-11 | Discharge: 2017-07-11 | Disposition: A | Discharge status: Home / Self Care | Payer: Commercial Managed Care - PPO | Source: Ambulatory Visit | Attending: Cardiology | Admitting: Cardiology

## 2017-07-11 DIAGNOSIS — Z48812 Encounter for surgical aftercare following surgery on the circulatory system: Secondary | ICD-10-CM | POA: Diagnosis not present

## 2017-07-11 DIAGNOSIS — Z951 Presence of aortocoronary bypass graft: Secondary | ICD-10-CM

## 2017-07-11 NOTE — Progress Notes (Signed)
Cardiology Office Note   Date:  07/13/2017   ID:  Kelly Hardy, DOB Aug 16, 1960, MRN [id number]  PCP:  Kelly Hilding, MD  Cardiologist:   Kelly Breeding, MD    Chief Complaint  Patient presents with  . Coronary Artery Disease     History of Present Illness: 57 y/o Caucasian male with complaints of exertional chest pain 02/01/16. OP stress Myoview done 02/09/16 showed diaphragmatic attenuation with 51mm inferior ST depression and chest pain. He was admitted the next day to [facility] for OP cath which revealed a 95% mRCA. He recieved a DES with good results.  When I last saw him he had angina and was admitted for cath.  This demonstrated dLM-ostial LAD 90% stenosis and 45% mLAD stenosis. The RCA sten was patent. He was discharged ad re admitted Jan 2nd for elective CABG x 1 with an LIMA-LAD. He tolerated this well. EF was 55-60%.  He returns for follow up.  He is back at work but not doing the heavy lifting that he was doing.   The patient denies any new symptoms such as chest discomfort, neck or arm discomfort. There has been no new shortness of breath, PND or orthopnea. There have been no presyncope or syncope.   He has rare palpitations and some very mild sternal pain.  Otherwise he is not getting any of the symptoms that were his anginal symptoms.  He id doing cardiac rehab.     Past Medical History:  Diagnosis Date  . Coronary artery disease   . Diabetes mellitus without complication (HCC)    Type I  . GERD (gastroesophageal reflux disease)   . Hyperlipidemia   . Hypertension   . Palpitations    eval 2014    Past Surgical History:  Procedure Laterality Date  . CARDIAC CATHETERIZATION N/A 02/10/2016   Procedure: Left Heart Cath and Coronary Angiography;  Surgeon: Kelly Sine, MD;  Location: [place] CV LAB;  Service: Cardiovascular;  Laterality: N/A;  . CARDIAC CATHETERIZATION N/A 02/10/2016   Procedure: Coronary Stent Intervention;  Surgeon: Kelly Sine, MD;  Location:  [place] CV LAB;  Service: Cardiovascular;  Laterality: N/A;  . CORONARY ARTERY BYPASS GRAFT N/A 04/04/2017   Procedure: CORONARY ARTERY BYPASS GRAFTING (CABG), times one, using left internal mammary artery. Off pump;  Surgeon: Kelly Nakayama, MD;  Location: [place];  Service: Open Heart Surgery;  Laterality: N/A;  . CORONARY STENT PLACEMENT  02/10/2016   STENT XIENCE ALPINE RX 3.25X15 drug eluting stent was successfully placed  . LEFT HEART CATH AND CORONARY ANGIOGRAPHY N/A 03/23/2017   Procedure: LEFT HEART CATH AND CORONARY ANGIOGRAPHY;  Surgeon: Martinique, Peter M, MD;  Location: [place] CV LAB;  Service: Cardiovascular;  Laterality: N/A;  . SALIVARY GLAND SURGERY Left    benign  . TEE WITHOUT CARDIOVERSION N/A 04/04/2017   Procedure: TRANSESOPHAGEAL ECHOCARDIOGRAM (TEE);  Surgeon: Kelly Nakayama, MD;  Location: [place];  Service: Open Heart Surgery;  Laterality: N/A;     Current Outpatient Medications  Medication Sig Dispense Refill  . acetaminophen (TYLENOL) 500 MG tablet Take 2 tablets (1,000 mg total) by mouth every 8 (eight) hours as needed. 30 tablet 0  . aspirin EC 81 MG tablet Take 81 mg by mouth daily.    . insulin lispro protamine-lispro (HUMALOG 75/25 MIX) (75-25) 100 UNIT/ML SUSP injection Inject 12 Units into the skin 2 (two) times daily with a meal.    . insulin regular (NOVOLIN R,HUMULIN  R) 100 units/mL injection Inject 2-10 Units into the skin 3 (three) times daily before meals. Per Sliding Scale    . losartan (COZAAR) 25 MG tablet Take 1 tablet (25 mg total) by mouth daily. 90 tablet 3  . metoprolol succinate (TOPROL-XL) 25 MG 24 hr tablet Take 25 mg by mouth daily.    . Multiple Vitamins-Minerals (MULTIVITAMIN WITH MINERALS) tablet Take 1 tablet by mouth daily.    Marland Kitchen NOVOFINE 32G X 6 MM MISC USE TO ADMINSTER INSULIN SIX TIMES DAILY.  4  . oxyCODONE (OXY IR/ROXICODONE) 5 MG immediate release tablet Take 1 tablet (5 mg total) by mouth every 4 (four) hours as  needed for severe pain. 30 tablet 0  . rosuvastatin (CRESTOR) 40 MG tablet TAKE ONE TABLET BY MOUTH AT BEDTIME. 30 tablet 11   No current facility-administered medications for this visit.     Allergies:   Patient has no known allergies.    ROS:  Please see the history of present illness.   Otherwise, review of systems are positive for none.   All other systems are reviewed and negative.    PHYSICAL EXAM: VS:  BP 135/71   Pulse 69   Ht 5\' 9"  (1.753 Hardy)   Wt 151 lb 6.4 oz (68.7 kg)   BMI 22.36 kg/Hardy  , BMI Body mass index is 22.36 kg/Hardy.  GENERAL:  Well appearing NECK:  No jugular venous distention, waveform within normal limits, carotid upstroke brisk and symmetric, no bruits, no thyromegaly LUNGS:  Clear to auscultation bilaterally CHEST:  Well healed sternotomy scar. HEART:  PMI not displaced or sustained,S1 and S2 within normal limits, no S3, no S4, no clicks, no rubs, no murmurs ABD:  Flat, positive bowel sounds normal in frequency in pitch, no bruits, no rebound, no guarding, no midline pulsatile mass, no hepatomegaly, no splenomegaly EXT:  2 plus pulses throughout, no edema, no cyanosis no clubbing    EKG:  EKG is  not ordered today.   Recent Labs: 04/02/2017: ALT 26 04/05/2017: Magnesium 2.1 04/07/2017: BUN 10; Creatinine, Ser 0.80; Hemoglobin 10.1; Platelets 151; Potassium 4.7; Sodium 136      Wt Readings from Last 3 Encounters:  07/13/17 151 lb 6.4 oz (68.7 kg)  06/15/17 147 lb (66.7 kg)  05/08/17 145 lb 6.4 oz (66 kg)     Other studies Reviewed: Additional studies/ records that were reviewed today include: Hospital records and lab Review of the above records demonstrates:    ASSESSMENT AND PLAN:  CABG:    The patient has no new sypmtoms.  No further cardiovascular testing is indicated.  We will continue with aggressive risk reduction and meds as listed.  He can stop his Brilinta.   DYSLIPIDEMIA:    He had a good lipid profile last year and will have this  repeated by Kelly Hilding, MD.  LDL should be less than 70.    HTN:   His BP is at target.  No change in therapy.   DM:  A1c was 7.7 down from 9.0.  This is now being followed by Kelly Hilding, MD   PALPITATIONS:   Rare palpitations.    Current medicines are reviewed at length with the patient today.  The patient does not have concerns regarding medicines.  The following changes have been made:   As above  Labs/ tests ordered today include:   None  No orders of the defined types were placed in this encounter.    Disposition:  Follow up with me in six months.   Signed, Kelly Breeding, MD  07/13/2017 9:16 AM    [facility]

## 2017-07-11 NOTE — Progress Notes (Signed)
Daily Session Note  Patient Details  Name: Kelly Hardy MRN: [id number] Date of Birth: 03-Apr-1961 Referring Provider:     [place] from 06/15/2017 in [place]  Referring Provider  DR. Hochrein      Encounter Date: 07/11/2017  Check In: Session Check In - 07/11/17 0849      Check-In   Location  [facility]    Staff Present  Diane Angelina Pih, MS, EP, [facility], Exercise Physiologist;Akshaj Besancon Luther Parody, BS, EP, Exercise Physiologist;Debra Wynetta Emery, RN, BSN    Supervising physician immediately available to respond to emergencies  See telemetry face sheet for immediately available MD    Medication changes reported      No    Fall or balance concerns reported     No    Warm-up and Cool-down  Performed as group-led instruction    Resistance Training Performed  Yes    VAD Patient?  No      Pain Assessment   Currently in Pain?  No/denies    Pain Score  0-No pain    Multiple Pain Sites  No       Capillary Blood Glucose: No results found for this or any previous visit (from the past 24 hour(s)).    Social History   Tobacco Use  Smoking Status Never Smoker  Smokeless Tobacco Never Used    Goals Met:  Independence with exercise equipment Exercise tolerated well No report of cardiac concerns or symptoms Strength training completed today  Goals Unmet:  Not Applicable  Comments: Check out 915   Dr. Kate Sable is Medical Director for [place] and Pulmonary Rehab.

## 2017-07-13 ENCOUNTER — Encounter: Payer: Self-pay | Admitting: Cardiology

## 2017-07-13 ENCOUNTER — Encounter (HOSPITAL_COMMUNITY): Payer: Commercial Managed Care - PPO

## 2017-07-13 ENCOUNTER — Ambulatory Visit (INDEPENDENT_AMBULATORY_CARE_PROVIDER_SITE_OTHER): Payer: Commercial Managed Care - PPO | Admitting: Cardiology

## 2017-07-13 VITALS — BP 135/71 | HR 69 | Ht 69.0 in | Wt 151.4 lb

## 2017-07-13 DIAGNOSIS — E785 Hyperlipidemia, unspecified: Secondary | ICD-10-CM

## 2017-07-13 DIAGNOSIS — I10 Essential (primary) hypertension: Secondary | ICD-10-CM | POA: Diagnosis not present

## 2017-07-13 DIAGNOSIS — I25110 Atherosclerotic heart disease of native coronary artery with unstable angina pectoris: Secondary | ICD-10-CM | POA: Diagnosis not present

## 2017-07-13 DIAGNOSIS — E118 Type 2 diabetes mellitus with unspecified complications: Secondary | ICD-10-CM | POA: Insufficient documentation

## 2017-07-13 NOTE — Patient Instructions (Signed)
Medication Instructions:  STOP- Brilinta  If you need a refill on your cardiac medications before your next appointment, please call your pharmacy.  Labwork: None Ordered   Testing/Procedures: None Ordered  Follow-Up: Your physician wants you to follow-up in: 6 Months. You should receive a reminder letter in the mail two months in advance. If you do not receive a letter, please call our office [phone].     Thank you for choosing [facility] at [facility]!!

## 2017-07-16 ENCOUNTER — Encounter (HOSPITAL_COMMUNITY)
Admission: RE | Admit: 2017-07-16 | Discharge: 2017-07-16 | Disposition: A | Discharge status: Home / Self Care | Payer: Commercial Managed Care - PPO | Source: Ambulatory Visit | Attending: Cardiology | Admitting: Cardiology

## 2017-07-16 DIAGNOSIS — Z951 Presence of aortocoronary bypass graft: Secondary | ICD-10-CM

## 2017-07-16 DIAGNOSIS — Z48812 Encounter for surgical aftercare following surgery on the circulatory system: Secondary | ICD-10-CM | POA: Diagnosis not present

## 2017-07-16 NOTE — Progress Notes (Signed)
Daily Session Note  Patient Details  Name: Kelly Hardy MRN: [id number] Date of Birth: 1960/12/06 Referring Provider:     [place] from 06/15/2017 in [place]  Referring Provider  DR. Hochrein      Encounter Date: 07/16/2017  Check In: Session Check In - 07/16/17 0815      Check-In   Location  [facility]    Staff Present  Aundra Dubin, RN, BSN;Gregory Luther Parody, BS, EP, Exercise Physiologist    Supervising physician immediately available to respond to emergencies  See telemetry face sheet for immediately available MD    Medication changes reported      No    Fall or balance concerns reported     No    Warm-up and Cool-down  Performed as group-led instruction    Resistance Training Performed  Yes    VAD Patient?  No      Pain Assessment   Currently in Pain?  No/denies    Pain Score  0-No pain    Multiple Pain Sites  No       Capillary Blood Glucose: No results found for this or any previous visit (from the past 24 hour(s)).    Social History   Tobacco Use  Smoking Status Never Smoker  Smokeless Tobacco Never Used    Goals Met:  Independence with exercise equipment Exercise tolerated well No report of cardiac concerns or symptoms Strength training completed today  Goals Unmet:  Not Applicable  Comments: Check out 915.   Dr. Kate Sable is Medical Director for [facility] Cardiac and Pulmonary Rehab.

## 2017-07-18 ENCOUNTER — Encounter (HOSPITAL_COMMUNITY): Payer: Commercial Managed Care - PPO

## 2017-07-20 ENCOUNTER — Encounter (HOSPITAL_COMMUNITY)
Admission: RE | Admit: 2017-07-20 | Discharge: 2017-07-20 | Disposition: A | Discharge status: Home / Self Care | Payer: Commercial Managed Care - PPO | Source: Ambulatory Visit | Attending: Cardiology | Admitting: Cardiology

## 2017-07-20 DIAGNOSIS — Z951 Presence of aortocoronary bypass graft: Secondary | ICD-10-CM

## 2017-07-20 DIAGNOSIS — Z48812 Encounter for surgical aftercare following surgery on the circulatory system: Secondary | ICD-10-CM | POA: Diagnosis not present

## 2017-07-20 NOTE — Progress Notes (Signed)
Daily Session Note  Patient Details  Name: GIOVONNI POIRIER MRN: [id number] Date of Birth: 1961/01/25 Referring Provider:     [place] from 06/15/2017 in [place]  Referring Provider  DR. Hochrein      Encounter Date: 07/20/2017  Check In: Session Check In - 07/20/17 0815      Check-In   Location  [facility]    Staff Present  Aundra Dubin, RN, BSN;Gregory Luther Parody, BS, EP, Exercise Physiologist    Supervising physician immediately available to respond to emergencies  See telemetry face sheet for immediately available MD    Medication changes reported      No    Fall or balance concerns reported     No    Warm-up and Cool-down  Performed as group-led instruction    Resistance Training Performed  Yes    VAD Patient?  No      Pain Assessment   Currently in Pain?  No/denies    Pain Score  0-No pain    Multiple Pain Sites  No       Capillary Blood Glucose: No results found for this or any previous visit (from the past 24 hour(s)).    Social History   Tobacco Use  Smoking Status Never Smoker  Smokeless Tobacco Never Used    Goals Met:  Independence with exercise equipment Exercise tolerated well No report of cardiac concerns or symptoms Strength training completed today  Goals Unmet:  Not Applicable  Comments: Check out 915.   Dr. Kate Sable is Medical Director for [facility] Cardiac and Pulmonary Rehab.

## 2017-07-23 ENCOUNTER — Encounter (HOSPITAL_COMMUNITY)
Admission: RE | Admit: 2017-07-23 | Discharge: 2017-07-23 | Disposition: A | Discharge status: Home / Self Care | Payer: Commercial Managed Care - PPO | Source: Ambulatory Visit | Attending: Cardiology | Admitting: Cardiology

## 2017-07-23 DIAGNOSIS — Z951 Presence of aortocoronary bypass graft: Secondary | ICD-10-CM

## 2017-07-23 DIAGNOSIS — Z48812 Encounter for surgical aftercare following surgery on the circulatory system: Secondary | ICD-10-CM | POA: Diagnosis not present

## 2017-07-23 NOTE — Progress Notes (Signed)
Daily Session Note  Patient Details  Name: Kelly Hardy MRN: [id number] Date of Birth: 1960-10-20 Referring Provider:     [place] from 06/15/2017 in [place]  Referring Provider  DR. Hochrein      Encounter Date: 07/23/2017  Check In: Session Check In - 07/23/17 0815      Check-In   Location  [facility]    Staff Present  Aundra Dubin, RN, BSN;Gregory Luther Parody, BS, EP, Exercise Physiologist    Supervising physician immediately available to respond to emergencies  See telemetry face sheet for immediately available MD    Medication changes reported      No    Fall or balance concerns reported     No    Warm-up and Cool-down  Performed as group-led instruction    Resistance Training Performed  Yes    VAD Patient?  No      Pain Assessment   Currently in Pain?  No/denies    Pain Score  0-No pain    Multiple Pain Sites  No       Capillary Blood Glucose: No results found for this or any previous visit (from the past 24 hour(s)).    Social History   Tobacco Use  Smoking Status Never Smoker  Smokeless Tobacco Never Used    Goals Met:  Independence with exercise equipment Exercise tolerated well No report of cardiac concerns or symptoms Strength training completed today  Goals Unmet:  Not Applicable  Comments: Check out 915.   Dr. Kate Sable is Medical Director for [facility] Cardiac and Pulmonary Rehab.

## 2017-07-25 ENCOUNTER — Encounter (HOSPITAL_COMMUNITY)
Admission: RE | Admit: 2017-07-25 | Discharge: 2017-07-25 | Disposition: A | Discharge status: Home / Self Care | Payer: Commercial Managed Care - PPO | Source: Ambulatory Visit | Attending: Cardiology | Admitting: Cardiology

## 2017-07-25 DIAGNOSIS — Z48812 Encounter for surgical aftercare following surgery on the circulatory system: Secondary | ICD-10-CM | POA: Diagnosis not present

## 2017-07-25 DIAGNOSIS — Z951 Presence of aortocoronary bypass graft: Secondary | ICD-10-CM

## 2017-07-25 NOTE — Progress Notes (Signed)
Daily Session Note  Patient Details  Name: BRANSYN ADAMI MRN: [id number] Date of Birth: 1960/06/14 Referring Provider:     [place] from 06/15/2017 in [place]  Referring Provider  DR. Hochrein      Encounter Date: 07/25/2017  Check In: Session Check In - 07/25/17 0815      Check-In   Location  [facility]    Staff Present  Aundra Dubin, RN, BSN;Gregory Luther Parody, BS, EP, Exercise Physiologist    Supervising physician immediately available to respond to emergencies  See telemetry face sheet for immediately available MD    Medication changes reported      No    Fall or balance concerns reported     No    Warm-up and Cool-down  Performed as group-led instruction    Resistance Training Performed  Yes    VAD Patient?  No      Pain Assessment   Currently in Pain?  No/denies    Pain Score  0-No pain    Multiple Pain Sites  No       Capillary Blood Glucose: No results found for this or any previous visit (from the past 24 hour(s)).    Social History   Tobacco Use  Smoking Status Never Smoker  Smokeless Tobacco Never Used    Goals Met:  Independence with exercise equipment Exercise tolerated well No report of cardiac concerns or symptoms Strength training completed today  Goals Unmet:  Not Applicable  Comments: Check out 915.   Dr. Kate Sable is Medical Director for [facility] Cardiac and Pulmonary Rehab.

## 2017-07-27 ENCOUNTER — Encounter (HOSPITAL_COMMUNITY)
Admission: RE | Admit: 2017-07-27 | Discharge: 2017-07-27 | Disposition: A | Discharge status: Home / Self Care | Payer: Commercial Managed Care - PPO | Source: Ambulatory Visit | Attending: Cardiology | Admitting: Cardiology

## 2017-07-27 DIAGNOSIS — Z48812 Encounter for surgical aftercare following surgery on the circulatory system: Secondary | ICD-10-CM | POA: Diagnosis not present

## 2017-07-27 NOTE — Progress Notes (Signed)
Daily Session Note  Patient Details  Name: ARBOR COHEN MRN: [id number] Date of Birth: [birth year] Referring Provider:     [place] from 06/15/2017 in [place]  Referring Provider  DR. Hochrein      Encounter Date: 07/27/2017  Check In: Session Check In - 07/27/17 0815      Check-In   Location  [facility]    Staff Present  Russella Dar, MS, EP, [facility], Exercise Physiologist;Gregory Luther Parody, BS, EP, Exercise Physiologist    Supervising physician immediately available to respond to emergencies  See telemetry face sheet for immediately available MD    Medication changes reported      No    Fall or balance concerns reported     No    Warm-up and Cool-down  Performed as group-led instruction    Resistance Training Performed  Yes    VAD Patient?  No      Pain Assessment   Currently in Pain?  No/denies    Pain Score  0-No pain    Multiple Pain Sites  No       Capillary Blood Glucose: No results found for this or any previous visit (from the past 24 hour(s)).    Social History   Tobacco Use  Smoking Status Never Smoker  Smokeless Tobacco Never Used    Goals Met:  Independence with exercise equipment Exercise tolerated well No report of cardiac concerns or symptoms Strength training completed today  Goals Unmet:  Not Applicable  Comments: Check out: 0915   Dr. Kate Sable is Medical Director for [place] and Pulmonary Rehab.

## 2017-07-30 ENCOUNTER — Encounter (HOSPITAL_COMMUNITY)
Admission: RE | Admit: 2017-07-30 | Discharge: 2017-07-30 | Disposition: A | Discharge status: Home / Self Care | Payer: Commercial Managed Care - PPO | Source: Ambulatory Visit | Attending: Cardiology | Admitting: Cardiology

## 2017-07-30 DIAGNOSIS — Z48812 Encounter for surgical aftercare following surgery on the circulatory system: Secondary | ICD-10-CM | POA: Diagnosis not present

## 2017-07-30 NOTE — Progress Notes (Signed)
Daily Session Note  Patient Details  Name: Kelly Hardy MRN: [id number] Date of Birth: Oct 05, 1960 Referring Provider:     [place] from 06/15/2017 in [place]  Referring Provider  DR. Hochrein      Encounter Date: 07/30/2017  Check In: Session Check In - 07/30/17 0815      Check-In   Location  [facility]    Staff Present  Russella Dar, MS, EP, [facility], Exercise Physiologist;Gregory Luther Parody, BS, EP, Exercise Physiologist    Supervising physician immediately available to respond to emergencies  See telemetry face sheet for immediately available MD    Medication changes reported      No    Fall or balance concerns reported     No    Warm-up and Cool-down  Performed as group-led instruction    Resistance Training Performed  Yes    VAD Patient?  No      Pain Assessment   Currently in Pain?  No/denies    Pain Score  0-No pain    Multiple Pain Sites  No       Capillary Blood Glucose: No results found for this or any previous visit (from the past 24 hour(s)).    Social History   Tobacco Use  Smoking Status Never Smoker  Smokeless Tobacco Never Used    Goals Met:  Independence with exercise equipment Exercise tolerated well No report of cardiac concerns or symptoms Strength training completed today  Goals Unmet:  Not Applicable  Comments: Check out: 0915   Dr. Kate Sable is Medical Director for [place] and Pulmonary Rehab.

## 2017-08-01 ENCOUNTER — Encounter (HOSPITAL_COMMUNITY)
Admission: RE | Admit: 2017-08-01 | Discharge: 2017-08-01 | Disposition: A | Discharge status: Home / Self Care | Payer: Commercial Managed Care - PPO | Source: Ambulatory Visit | Attending: Cardiology | Admitting: Cardiology

## 2017-08-01 DIAGNOSIS — Z951 Presence of aortocoronary bypass graft: Secondary | ICD-10-CM | POA: Insufficient documentation

## 2017-08-01 DIAGNOSIS — Z48812 Encounter for surgical aftercare following surgery on the circulatory system: Secondary | ICD-10-CM | POA: Insufficient documentation

## 2017-08-01 NOTE — Progress Notes (Signed)
Daily Session Note  Patient Details  Name: Kelly Hardy MRN: [id number] Date of Birth: 08/31/1960 Referring Provider:     CARDIAC REHAB PHASE II ORIENTATION from 06/15/2017 in [place] CARDIAC REHABILITATION  Referring Provider  DR. Hochrein      Encounter Date: 08/01/2017  Check In: Session Check In - 08/01/17 0823      Check-In   Location  [facility]    Staff Present  Diane Coad, MS, EP, [facility], Exercise Physiologist;Jameka Ivie, BS, EP, Exercise Physiologist;Debra Johnson, RN, BSN    Supervising physician immediately available to respond to emergencies  See telemetry face sheet for immediately available MD    Medication changes reported      No    Fall or balance concerns reported     No    Warm-up and Cool-down  Performed as group-led instruction    Resistance Training Performed  Yes    VAD Patient?  No      Pain Assessment   Currently in Pain?  No/denies    Pain Score  0-No pain    Multiple Pain Sites  No       Capillary Blood Glucose: No results found for this or any previous visit (from the past 24 hour(s)).    Social History   Tobacco Use  Smoking Status Never Smoker  Smokeless Tobacco Never Used    Goals Met:  Independence with exercise equipment Exercise tolerated well No report of cardiac concerns or symptoms Strength training completed today  Goals Unmet:  Not Applicable  Comments: Check out 915   Dr. Suresh Koneswaran is Medical Director for [place] Cardiac and Pulmonary Rehab. 

## 2017-08-01 NOTE — Progress Notes (Signed)
Cardiac Individual Treatment Plan  Patient Details  Name: Kelly Hardy MRN: [id number] Date of Birth: 03/27/1961 Referring Provider:     CARDIAC REHAB PHASE II ORIENTATION from 06/15/2017 in [facility]  Referring Provider  DR. Hochrein      Initial Encounter Date:    CARDIAC REHAB PHASE II ORIENTATION from 06/15/2017 in [facility]  Date  06/15/17  Referring Provider  DR. Hochrein      Visit Diagnosis: S/P CABG x 1  Patient's Home Medications on Admission:  Current Outpatient Medications:  .  acetaminophen (TYLENOL) 500 MG tablet, Take 2 tablets (1,000 mg total) by mouth every 8 (eight) hours as needed., Disp: 30 tablet, Rfl: 0 .  aspirin EC 81 MG tablet, Take 81 mg by mouth daily., Disp: , Rfl:  .  insulin lispro protamine-lispro (HUMALOG 75/25 MIX) (75-25) 100 UNIT/ML SUSP injection, Inject 12 Units into the skin 2 (two) times daily with a meal., Disp: , Rfl:  .  insulin regular (NOVOLIN R,HUMULIN R) 100 units/mL injection, Inject 2-10 Units into the skin 3 (three) times daily before meals. Per Sliding Scale, Disp: , Rfl:  .  losartan (COZAAR) 25 MG tablet, Take 1 tablet (25 mg total) by mouth daily., Disp: 90 tablet, Rfl: 3 .  metoprolol succinate (TOPROL-XL) 25 MG 24 hr tablet, Take 25 mg by mouth daily., Disp: , Rfl:  .  Multiple Vitamins-Minerals (MULTIVITAMIN WITH MINERALS) tablet, Take 1 tablet by mouth daily., Disp: , Rfl:  .  NOVOFINE 32G X 6 MM MISC, USE TO ADMINSTER INSULIN SIX TIMES DAILY., Disp: , Rfl: 4 .  oxyCODONE (OXY IR/ROXICODONE) 5 MG immediate release tablet, Take 1 tablet (5 mg total) by mouth every 4 (four) hours as needed for severe pain., Disp: 30 tablet, Rfl: 0 .  rosuvastatin (CRESTOR) 40 MG tablet, TAKE ONE TABLET BY MOUTH AT BEDTIME., Disp: 30 tablet, Rfl: 11  Past Medical History: Past Medical History:  Diagnosis Date  . Coronary artery disease   . Diabetes mellitus without complication (HCC)    Type I   . GERD (gastroesophageal reflux disease)   . Hyperlipidemia   . Hypertension   . Palpitations    eval 2014    Tobacco Use: Social History   Tobacco Use  Smoking Status Never Smoker  Smokeless Tobacco Never Used    Labs: Recent Review Flowsheet Data    Labs for ITP Cardiac and Pulmonary Rehab Latest Ref Rng & Units 04/04/2017 04/04/2017 04/04/2017 04/04/2017 04/05/2017   Hemoglobin A1c 4.8 - 5.6 % - - - - -   PHART 7.350 - 7.450 7.405 - 7.277(L) - -   PCO2ART 32.0 - 48.0 mmHg 31.5(L) - 46.3 - -   HCO3 20.0 - 28.0 mmol/L 19.8(L) - 21.7 - -   TCO2 22 - 32 mmol/L 21(L) 22 23 23 26    ACIDBASEDEF 0.0 - 2.0 mmol/L 4.0(H) - 5.0(H) - -   O2SAT % 100.0 - 100.0 - -      Capillary Blood Glucose: Lab Results  Component Value Date   GLUCAP 177 (H) 04/08/2017   GLUCAP 226 (H) 04/07/2017   GLUCAP 161 (H) 04/07/2017   GLUCAP 202 (H) 04/07/2017   GLUCAP 247 (H) 04/07/2017     Exercise Target Goals:    Exercise Program Goal: Individual exercise prescription set using results from initial 6 min walk test and THRR while considering  patient's activity barriers and safety.   Exercise Prescription Goal: Starting with aerobic activity 30 plus  minutes a day, 3 days per week for initial exercise prescription. Provide home exercise prescription and guidelines that participant acknowledges understanding prior to discharge.  Activity Barriers & Risk Stratification: Activity Barriers & Cardiac Risk Stratification - 06/15/17 1343      Activity Barriers & Cardiac Risk Stratification   Activity Barriers  None    Cardiac Risk Stratification  High       6 Minute Walk: 6 Minute Walk    Row Name 06/15/17 1342         6 Minute Walk   Phase  Initial     Distance  1550 feet     Distance % Change  0 %     Distance Feet Change  0 ft     Walk Time  6 minutes     # of Rest Breaks  0     MPH  2.93     METS  3.25     RPE  9     Perceived Dyspnea   9     VO2 Peak  17.19     Symptoms  No      Resting HR  109 bpm     Resting BP  112/60     Resting Oxygen Saturation   98 %     Exercise Oxygen Saturation  during 6 min walk  98 %     Max Ex. HR  121 bpm     Max Ex. BP  162/70     2 Minute Post BP  120/62        Oxygen Initial Assessment:   Oxygen Re-Evaluation:   Oxygen Discharge (Final Oxygen Re-Evaluation):   Initial Exercise Prescription: Initial Exercise Prescription - 06/15/17 1300      Date of Initial Exercise RX and Referring Provider   Date  06/15/17    Referring Provider  DR. Hochrein      Treadmill   MPH  2    Grade  0    Minutes  15    METs  2.5      Recumbant Elliptical   Level  1    RPM  30    Watts  34    Minutes  20    METs  2      Prescription Details   Frequency (times per week)  3    Duration  Progress to 30 minutes of continuous aerobic without signs/symptoms of physical distress      Intensity   THRR 40-80% of Max Heartrate  [phone]    Ratings of Perceived Exertion  11-13    Perceived Dyspnea  0-4      Progression   Progression  Continue progressive overload as per policy without signs/symptoms or physical distress.      Resistance Training   Training Prescription  Yes    Weight  1    Reps  10-15       Perform Capillary Blood Glucose checks as needed.  Exercise Prescription Changes:  Exercise Prescription Changes    Row Name 06/19/17 1500 06/28/17 1500 07/23/17 1400         Response to Exercise   Blood Pressure (Admit)  128/50  120/50  110/56     Blood Pressure (Exercise)  140/60  150/60  150/60     Blood Pressure (Exit)  100/50  100/50  100/50     Heart Rate (Admit)  87 bpm  64 bpm  90 bpm     Heart Rate (Exercise)  109 bpm  106 bpm  116 bpm     Heart Rate (Exit)  104 bpm  74 bpm  100 bpm     Rating of Perceived Exertion (Exercise)  11  12  10      Duration  Progress to 30 minutes of  aerobic without signs/symptoms of physical distress  Progress to 30 minutes of  aerobic without signs/symptoms of physical  distress  Progress to 30 minutes of  aerobic without signs/symptoms of physical distress     Intensity  THRR New [phone]  THRR New 104-123-143  THRR New [phone]       Progression   Progression  Continue to progress workloads to maintain intensity without signs/symptoms of physical distress.  Continue to progress workloads to maintain intensity without signs/symptoms of physical distress.  Continue to progress workloads to maintain intensity without signs/symptoms of physical distress.       Resistance Training   Training Prescription  Yes  Yes  Yes     Weight  1  2  2      Reps  10-15  10-15  10-15       Treadmill   MPH  2  2.4  2.7     Grade  0  0  0     Minutes  20  20  20      METs  2.5  2.83  3.06       Recumbant Elliptical   Level  1  1  2      RPM  37  48  62     Watts  44  57  91     Minutes  15  15  15      METs  2.6  3.1  5.1       Home Exercise Plan   Plans to continue exercise at  Home (comment)  Home (comment)  Home (comment)     Frequency  Add 2 additional days to program exercise sessions.  Add 2 additional days to program exercise sessions.  Add 2 additional days to program exercise sessions.     Initial Home Exercises Provided  06/19/17  06/19/17  06/19/17        Exercise Comments:  Exercise Comments    Row Name 06/19/17 1510 06/28/17 1523 07/23/17 1431       Exercise Comments  Patient has just started CR and will be progressed in time.   Patient is doing well in Cr and has progressed his speed on the treadmill machine and has also maintained his level and SPMs on teh recumbent elliptical. Patient has still only just started the program and will be progressed more in time by the staff here at [facility].   Patient continues to do well in CR. Patient has increased his speed on the treadmill to 2.18mph and has also been able to increase his level as well as watts and METs on the recumbent elliptical. Patient has stated to me that he feels that he has regained some  stamina since beginning the program. Patient will continued to be monitored throughout the remainder of the program.         Exercise Goals and Review:  Exercise Goals    Row Name 06/15/17 1344             Exercise Goals   Increase Physical Activity  Yes       Intervention  Provide advice, education, support and counseling about physical activity/exercise needs.;Develop an individualized exercise prescription for aerobic  and resistive training based on initial evaluation findings, risk stratification, comorbidities and participant's personal goals.       Expected Outcomes  Short Term: Attend rehab on a regular basis to increase amount of physical activity.       Increase Strength and Stamina  Yes       Intervention  Provide advice, education, support and counseling about physical activity/exercise needs.;Develop an individualized exercise prescription for aerobic and resistive training based on initial evaluation findings, risk stratification, comorbidities and participant's personal goals.       Expected Outcomes  Short Term: Increase workloads from initial exercise prescription for resistance, speed, and METs.       Able to understand and use rate of perceived exertion (RPE) scale  Yes       Intervention  Provide education and explanation on how to use RPE scale       Expected Outcomes  Short Term: Able to use RPE daily in rehab to express subjective intensity level;Long Term:  Able to use RPE to guide intensity level when exercising independently       Able to understand and use Dyspnea scale  Yes       Intervention  Provide education and explanation on how to use Dyspnea scale       Expected Outcomes  Long Term: Able to use Dyspnea scale to guide intensity level when exercising independently;Short Term: Able to use Dyspnea scale daily in rehab to express subjective sense of shortness of breath during exertion       Knowledge and understanding of Target Heart Rate Range (THRR)  Yes        Intervention  Provide education and explanation of THRR including how the numbers were predicted and where they are located for reference       Expected Outcomes  Short Term: Able to state/look up THRR;Long Term: Able to use THRR to govern intensity when exercising independently;Short Term: Able to use daily as guideline for intensity in rehab       Able to check pulse independently  Yes       Intervention  Provide education and demonstration on how to check pulse in carotid and radial arteries.;Review the importance of being able to check your own pulse for safety during independent exercise       Expected Outcomes  Short Term: Able to explain why pulse checking is important during independent exercise;Long Term: Able to check pulse independently and accurately       Understanding of Exercise Prescription  Yes       Intervention  Provide education, explanation, and written materials on patient's individual exercise prescription       Expected Outcomes  Short Term: Able to explain program exercise prescription;Long Term: Able to explain home exercise prescription to exercise independently          Exercise Goals Re-Evaluation : Exercise Goals Re-Evaluation    Row Name 06/28/17 1521 07/23/17 1430           Exercise Goal Re-Evaluation   Exercise Goals Review  Increase Physical Activity;Increase Strength and Stamina;Able to understand and use rate of perceived exertion (RPE) scale;Able to check pulse independently;Knowledge and understanding of Target Heart Rate Range (THRR);Able to understand and use Dyspnea scale;Understanding of Exercise Prescription  Increase Physical Activity;Increase Strength and Stamina;Able to understand and use rate of perceived exertion (RPE) scale;Able to check pulse independently;Knowledge and understanding of Target Heart Rate Range (THRR);Able to understand and use Dyspnea scale;Understanding of Exercise Prescription  Comments  Patient is doing well in Cr and  has progressed his speed on the treadmill machine and has also maintained his level and SPMs on teh recumbent elliptical. Patient has still only just started the program and will be progressed more in time by the staff here at [facility].   Patient continues to do well in [facility]. Patient has increased his speed on the treadmill to 2.41mph and has also been able to increase his level as well as watts and METs on the recumbent elliptical. Patient has stated to me that he feels that he has regained some stamina since beginning the program. Patient will continued to be monitored throughout the remainder of the program.       Expected Outcomes  Patient wishes to regain stamina and to get more education about nutrition and exercise.   Patient wishes to regain stamina and to get more education about nutrition and exercise.           Discharge Exercise Prescription (Final Exercise Prescription Changes): Exercise Prescription Changes - 07/23/17 1400      Response to Exercise   Blood Pressure (Admit)  110/56    Blood Pressure (Exercise)  150/60    Blood Pressure (Exit)  100/50    Heart Rate (Admit)  90 bpm    Heart Rate (Exercise)  116 bpm    Heart Rate (Exit)  100 bpm    Rating of Perceived Exertion (Exercise)  10    Duration  Progress to 30 minutes of  aerobic without signs/symptoms of physical distress    Intensity  THRR New [phone]      Progression   Progression  Continue to progress workloads to maintain intensity without signs/symptoms of physical distress.      Resistance Training   Training Prescription  Yes    Weight  2    Reps  10-15      Treadmill   MPH  2.7    Grade  0    Minutes  20    METs  3.06      Recumbant Elliptical   Level  2    RPM  62    Watts  91    Minutes  15    METs  5.1      Home Exercise Plan   Plans to continue exercise at  Home (comment)    Frequency  Add 2 additional days to program exercise sessions.    Initial Home Exercises Provided  06/19/17        Nutrition:  Target Goals: Understanding of nutrition guidelines, daily intake of sodium 1500mg , cholesterol 200mg , calories 30% from fat and 7% or less from saturated fats, daily to have 5 or more servings of fruits and vegetables.  Biometrics: Pre Biometrics - 06/15/17 1345      Pre Biometrics   Height  5\' 9"  (1.753 m)    Weight  147 lb (66.7 kg)    Waist Circumference  29 inches    Hip Circumference  33 inches    Waist to Hip Ratio  0.88 %    BMI (Calculated)  21.7    Triceps Skinfold  10 mm    % Body Fat  18.1 %    Grip Strength  64.06 kg    Flexibility  13.66 in    Single Leg Stand  60 seconds        Nutrition Therapy Plan and Nutrition Goals: Nutrition Therapy & Goals - 08/01/17 1335  Nutrition Therapy   RD appointment deferred  Yes      Personal Nutrition Goals   Personal Goal #2  Patient continues to say he eats heart healthy.    Additional Goals?  No       Nutrition Assessments: Nutrition Assessments - 06/15/17 1501      MEDFICTS Scores   Pre Score  33       Nutrition Goals Re-Evaluation:   Nutrition Goals Discharge (Final Nutrition Goals Re-Evaluation):   Psychosocial: Target Goals: Acknowledge presence or absence of significant depression and/or stress, maximize coping skills, provide positive support system. Participant is able to verbalize types and ability to use techniques and skills needed for reducing stress and depression.  Initial Review & Psychosocial Screening: Initial Psych Review & Screening - 06/15/17 1509      Initial Review   Current issues with  None Identified      Family Dynamics   Good Support System?  Yes      Barriers   Psychosocial barriers to participate in program  There are no identifiable barriers or psychosocial needs.      Screening Interventions   Interventions  Encouraged to exercise    Expected Outcomes  Short Term goal: Identification and review with participant of any Quality of Life or Depression  concerns found by scoring the questionnaire.;Long Term goal: The participant improves quality of Life and PHQ9 Scores as seen by post scores and/or verbalization of changes       Quality of Life Scores: Quality of Life - 06/15/17 1400      Quality of Life Scores   Health/Function Pre  23.83 %    Socioeconomic Pre  28.07 %    Psych/Spiritual Pre  27.86 %    Family Pre  25.2 %    GLOBAL Pre  25.74 %      Scores of 19 and below usually indicate a poorer quality of life in these areas.  A difference of  2-3 points is a clinically meaningful difference.  A difference of 2-3 points in the total score of the Quality of Life Index has been associated with significant improvement in overall quality of life, self-image, physical symptoms, and general health in studies assessing change in quality of life.  PHQ-9: Recent Review Flowsheet Data    Depression screen [facility] 2/9 06/15/2017   Decreased Interest 0   Down, Depressed, Hopeless 0   PHQ - 2 Score 0   Altered sleeping 0   Tired, decreased energy 1   Change in appetite 0   Feeling bad or failure about yourself  0   Trouble concentrating 0   Moving slowly or fidgety/restless 0   Suicidal thoughts 0   PHQ-9 Score 1   Difficult doing work/chores Not difficult at all     Interpretation of Total Score  Total Score Depression Severity:  1-4 = Minimal depression, 5-9 = Mild depression, 10-14 = Moderate depression, 15-19 = Moderately severe depression, 20-27 = Severe depression   Psychosocial Evaluation and Intervention: Psychosocial Evaluation - 06/15/17 1511      Psychosocial Evaluation & Interventions   Interventions  Encouraged to exercise with the program and follow exercise prescription    Continue Psychosocial Services   No Follow up required       Psychosocial Re-Evaluation: Psychosocial Re-Evaluation    [place] Name 06/15/17 1511 07/04/17 1339 08/01/17 1339         Psychosocial Re-Evaluation   Current issues with  None  Identified  None  Identified  None Identified     Comments  -  Patient's initial QOL score was 25.74 and his PHQ-9 score was 1 with no psychosocial issues identified.   Patient's initial QOL score was 25.74 and his PHQ-9 score was 1 with no psychosocial issues identified.      Expected Outcomes  -  Patient will have no psychosocial issues identified at discharge.   Patient will have no psychosocial issues identified at discharge.      Interventions  -  Stress management education;Encouraged to attend Cardiac Rehabilitation for the exercise;Relaxation education  Stress management education;Encouraged to attend Cardiac Rehabilitation for the exercise;Relaxation education     Continue Psychosocial Services   No Follow up required  No Follow up required  No Follow up required        Psychosocial Discharge (Final Psychosocial Re-Evaluation): Psychosocial Re-Evaluation - 08/01/17 1339      Psychosocial Re-Evaluation   Current issues with  None Identified    Comments  Patient's initial QOL score was 25.74 and his PHQ-9 score was 1 with no psychosocial issues identified.     Expected Outcomes  Patient will have no psychosocial issues identified at discharge.     Interventions  Stress management education;Encouraged to attend Cardiac Rehabilitation for the exercise;Relaxation education    Continue Psychosocial Services   No Follow up required       Vocational Rehabilitation: Provide vocational rehab assistance to qualifying candidates.   Vocational Rehab Evaluation & Intervention: Vocational Rehab - 06/15/17 1459      Initial Vocational Rehab Evaluation & Intervention   Assessment shows need for Vocational Rehabilitation  No       Education: Education Goals: Education classes will be provided on a weekly basis, covering required topics. Participant will state understanding/return demonstration of topics presented.  Learning Barriers/Preferences: Learning Barriers/Preferences - 06/15/17 1458       Learning Barriers/Preferences   Learning Barriers  None    Learning Preferences  Audio;Computer/Internet;Group Instruction;Individual Instruction;Pictoral;Skilled Demonstration;Verbal Instruction;Video;Written Material       Education Topics: Hypertension, Hypertension Reduction -Define heart disease and high blood pressure. Discus how high blood pressure affects the body and ways to reduce high blood pressure.   Exercise and Your Heart -Discuss why it is important to exercise, the FITT principles of exercise, normal and abnormal responses to exercise, and how to exercise safely.   Angina -Discuss definition of angina, causes of angina, treatment of angina, and how to decrease risk of having angina.   Cardiac Medications -Review what the following cardiac medications are used for, how they affect the body, and side effects that may occur when taking the medications.  Medications include Aspirin, Beta blockers, calcium channel blockers, ACE Inhibitors, angiotensin receptor blockers, diuretics, digoxin, and antihyperlipidemics.   Congestive Heart Failure -Discuss the definition of CHF, how to live with CHF, the signs and symptoms of CHF, and how keep track of weight and sodium intake.   Heart Disease and Intimacy -Discus the effect sexual activity has on the heart, how changes occur during intimacy as we age, and safety during sexual activity.   Smoking Cessation / COPD -Discuss different methods to quit smoking, the health benefits of quitting smoking, and the definition of COPD.   CARDIAC REHAB PHASE II EXERCISE from 07/25/2017 in [place]  Date  06/21/17  Educator  DC  Instruction Review Code  2- Demonstrated Understanding      Nutrition I: Fats -Discuss the types of cholesterol, what cholesterol does to  the heart, and how cholesterol levels can be controlled.   CARDIAC REHAB PHASE II EXERCISE from 07/25/2017 in [place]   Date  06/28/17  Educator  DC  Instruction Review Code  2- Demonstrated Understanding      Nutrition II: Labels -Discuss the different components of food labels and how to read food label   CARDIAC REHAB PHASE II EXERCISE from 07/25/2017 in [place]  Date  07/04/17  Educator  DJ  Instruction Review Code  2- Demonstrated Understanding      Heart Parts/Heart Disease and PAD -Discuss the anatomy of the heart, the pathway of blood circulation through the heart, and these are affected by heart disease.   CARDIAC REHAB PHASE II EXERCISE from 07/25/2017 in [place]  Date  07/11/17  Educator  DJ  Instruction Review Code  2- Demonstrated Understanding      Stress I: Signs and Symptoms -Discuss the causes of stress, how stress may lead to anxiety and depression, and ways to limit stress.   Stress II: Relaxation -Discuss different types of relaxation techniques to limit stress.   CARDIAC REHAB PHASE II EXERCISE from 07/25/2017 in [place]  Date  07/25/17  Educator  DJ  Instruction Review Code  2- Demonstrated Understanding      Warning Signs of Stroke / TIA -Discuss definition of a stroke, what the signs and symptoms are of a stroke, and how to identify when someone is having stroke.   Knowledge Questionnaire Score: Knowledge Questionnaire Score - 06/15/17 1459      Knowledge Questionnaire Score   Pre Score  21/24       Core Components/Risk Factors/Patient Goals at Admission: Personal Goals and Risk Factors at Admission - 06/15/17 1501      Core Components/Risk Factors/Patient Goals on Admission    Weight Management  Weight Maintenance    Diabetes  Yes    Expected Outcomes  Long Term: Attainment of HbA1C < 7%.;Short Term: Participant verbalizes understanding of the signs/symptoms and immediate care of hyper/hypoglycemia, proper foot care and importance of medication, aerobic/resistive exercise and  nutrition plan for blood glucose control.    Personal Goal Other  Yes    Personal Goal  Regain Stamina, More education about exercise, nutrition    Intervention  Attend 3 x week and supplement home exercise 2 x week.    Expected Outcomes  Reach goals       Core Components/Risk Factors/Patient Goals Review:  Goals and Risk Factor Review    Row Name 07/04/17 1336 08/01/17 1336           Core Components/Risk Factors/Patient Goals Review   Personal Goals Review  Weight Management/Obesity Regain stamina; more education about exercise/nutrition.   Weight Management/Obesity Regain stamina; more education about exercise/nutrition.      Review  Patient has completed 9 sessions gaining 1 lb since he started the program. His reported fasting glucose readings have been labile. His last A1C was 04/02/17 at 8.5. He says he feels his flexiblity has improved since he started. He has returned to work. Will continue to monitor for progress.   Patient has completed 19 sessions gaining 2.3 lbs since he started the program. His reported fasting glucose readings continue to fluctuate. No recent A1C avaliable. He is doing well in the program with progressions. He says he is feeling stronger and is able to work without difficulty. 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 He says he does not get as tired after working all day. Will continue to monitor for progress.       Expected Outcomes  Patient will continue to attend sessions and complete the program meeting his personal goals.   Patient will continue to attend sessions and complete the program meeting his personal goals.          Core Components/Risk Factors/Patient Goals at Discharge (Final Review):  Goals and Risk Factor Review - 08/01/17 1336      Core Components/Risk Factors/Patient Goals Review   Personal Goals Review  Weight Management/Obesity Regain stamina; more education about  exercise/nutrition.    Review  Patient has completed 19 sessions gaining 2.3 lbs since he started the program. His reported fasting glucose readings continue to fluctuate. No recent A1C avaliable. He is doing well in the program with progressions. He says he is feeling stronger and is able to work without difficulty. 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 He says he does not get as tired after working all day. Will continue to monitor for progress.     Expected Outcomes  Patient will continue to attend sessions and complete the program meeting his personal goals.        ITP Comments:   Comments: ITP 30 Day REVIEW Patient doing well in the program. Will continue to monitor for progress.

## 2017-08-03 ENCOUNTER — Encounter (HOSPITAL_COMMUNITY)
Admission: RE | Admit: 2017-08-03 | Discharge: 2017-08-03 | Disposition: A | Discharge status: Home / Self Care | Payer: Commercial Managed Care - PPO | Source: Ambulatory Visit | Attending: Cardiology | Admitting: Cardiology

## 2017-08-03 DIAGNOSIS — Z48812 Encounter for surgical aftercare following surgery on the circulatory system: Secondary | ICD-10-CM | POA: Diagnosis not present

## 2017-08-03 NOTE — Progress Notes (Signed)
Daily Session Note  Patient Details  Name: Kelly Hardy MRN: [id number] Date of Birth: 10/25/1960 Referring Provider:     [place] from 06/15/2017 in [place]  Referring Provider  DR. Hochrein      Encounter Date: 08/03/2017  Check In: Session Check In - 08/03/17 0815      Check-In   Location  [facility]    Staff Present  Russella Dar, MS, EP, [facility], Exercise Physiologist;Gregory Luther Parody, BS, EP, Exercise Physiologist    Supervising physician immediately available to respond to emergencies  See telemetry face sheet for immediately available MD    Medication changes reported      No    Fall or balance concerns reported     No    Warm-up and Cool-down  Performed as group-led instruction    Resistance Training Performed  Yes    VAD Patient?  No      Pain Assessment   Currently in Pain?  No/denies    Pain Score  0-No pain    Multiple Pain Sites  No       Capillary Blood Glucose: No results found for this or any previous visit (from the past 24 hour(s)).    Social History   Tobacco Use  Smoking Status Never Smoker  Smokeless Tobacco Never Used    Goals Met:  Independence with exercise equipment Exercise tolerated well No report of cardiac concerns or symptoms Strength training completed today  Goals Unmet:  Not Applicable  Comments: Check out: 0915   Dr. Kate Sable is Medical Director for [place] and Pulmonary Rehab.

## 2017-08-06 ENCOUNTER — Encounter (HOSPITAL_COMMUNITY)
Admission: RE | Admit: 2017-08-06 | Discharge: 2017-08-06 | Disposition: A | Discharge status: Home / Self Care | Payer: Commercial Managed Care - PPO | Source: Ambulatory Visit | Attending: Cardiology | Admitting: Cardiology

## 2017-08-06 DIAGNOSIS — Z951 Presence of aortocoronary bypass graft: Secondary | ICD-10-CM

## 2017-08-06 DIAGNOSIS — Z48812 Encounter for surgical aftercare following surgery on the circulatory system: Secondary | ICD-10-CM | POA: Diagnosis not present

## 2017-08-06 NOTE — Progress Notes (Signed)
Daily Session Note  Patient Details  Name: Kelly Hardy MRN: [id number] Date of Birth: March 09, 1961 Referring Provider:     [place] from 06/15/2017 in [place]  Referring Provider  DR. Hochrein      Encounter Date: 08/06/2017  Check In: Session Check In - 08/06/17 0815      Check-In   Location  [facility]    Staff Present  Diane Angelina Pih, MS, EP, [facility], Exercise Physiologist;Gregory Luther Parody, BS, EP, Exercise Physiologist;Zeinab Rodwell Wynetta Emery, RN, BSN    Supervising physician immediately available to respond to emergencies  See telemetry face sheet for immediately available MD    Medication changes reported      No    Fall or balance concerns reported     No    Warm-up and Cool-down  Performed as group-led instruction    Resistance Training Performed  Yes    VAD Patient?  No      Pain Assessment   Currently in Pain?  No/denies    Pain Score  0-No pain    Multiple Pain Sites  No       Capillary Blood Glucose: No results found for this or any previous visit (from the past 24 hour(s)).    Social History   Tobacco Use  Smoking Status Never Smoker  Smokeless Tobacco Never Used    Goals Met:  Independence with exercise equipment Exercise tolerated well No report of cardiac concerns or symptoms Strength training completed today  Goals Unmet:  Not Applicable  Comments: Check out 915.   Dr. Kate Sable is Medical Director for [facility] Cardiac and Pulmonary Rehab.

## 2017-08-08 ENCOUNTER — Encounter (HOSPITAL_COMMUNITY)
Admission: RE | Admit: 2017-08-08 | Discharge: 2017-08-08 | Disposition: A | Discharge status: Home / Self Care | Payer: Commercial Managed Care - PPO | Source: Ambulatory Visit | Attending: Cardiology | Admitting: Cardiology

## 2017-08-08 DIAGNOSIS — Z48812 Encounter for surgical aftercare following surgery on the circulatory system: Secondary | ICD-10-CM | POA: Diagnosis not present

## 2017-08-08 NOTE — Progress Notes (Signed)
Daily Session Note  Patient Details  Name: Kelly Hardy MRN: [id number] Date of Birth: 26-Jul-1960 Referring Provider:     [place] from 06/15/2017 in [place]  Referring Provider  DR. Hochrein      Encounter Date: 08/08/2017  Check In: Session Check In - 08/08/17 0815      Check-In   Location  [facility]    Staff Present  Russella Dar, MS, EP, [facility], Exercise Physiologist;Gregory Luther Parody, BS, EP, Exercise Physiologist    Supervising physician immediately available to respond to emergencies  See telemetry face sheet for immediately available MD    Medication changes reported      No    Fall or balance concerns reported     No    Warm-up and Cool-down  Performed as group-led instruction    Resistance Training Performed  Yes    VAD Patient?  No      Pain Assessment   Currently in Pain?  No/denies    Pain Score  0-No pain    Multiple Pain Sites  No       Capillary Blood Glucose: No results found for this or any previous visit (from the past 24 hour(s)).    Social History   Tobacco Use  Smoking Status Never Smoker  Smokeless Tobacco Never Used    Goals Met:  Independence with exercise equipment Exercise tolerated well No report of cardiac concerns or symptoms Strength training completed today  Goals Unmet:  Not Applicable  Comments: Check out: 0915   Dr. Kate Sable is Medical Director for [place] and Pulmonary Rehab.

## 2017-08-10 ENCOUNTER — Encounter (HOSPITAL_COMMUNITY)
Admission: RE | Admit: 2017-08-10 | Discharge: 2017-08-10 | Disposition: A | Discharge status: Home / Self Care | Payer: Commercial Managed Care - PPO | Source: Ambulatory Visit | Attending: Cardiology | Admitting: Cardiology

## 2017-08-10 DIAGNOSIS — Z951 Presence of aortocoronary bypass graft: Secondary | ICD-10-CM

## 2017-08-10 DIAGNOSIS — Z48812 Encounter for surgical aftercare following surgery on the circulatory system: Secondary | ICD-10-CM | POA: Diagnosis not present

## 2017-08-10 NOTE — Progress Notes (Signed)
Daily Session Note  Patient Details  Name: Kelly Hardy MRN: [id number] Date of Birth: 1960-10-26 Referring Provider:     [place] from 06/15/2017 in [place]  Referring Provider  DR. Hochrein      Encounter Date: 08/10/2017  Check In: Session Check In - 08/10/17 0844      Check-In   Location  [facility]    Staff Present  Diane Angelina Pih, MS, EP, [facility], Exercise Physiologist;Lashuna Tamashiro Luther Parody, BS, EP, Exercise Physiologist;Debra Wynetta Emery, RN, BSN    Supervising physician immediately available to respond to emergencies  See telemetry face sheet for immediately available MD    Medication changes reported      No    Fall or balance concerns reported     No    Warm-up and Cool-down  Performed as group-led instruction    Resistance Training Performed  Yes    VAD Patient?  No      Pain Assessment   Currently in Pain?  No/denies    Pain Score  0-No pain    Multiple Pain Sites  No       Capillary Blood Glucose: No results found for this or any previous visit (from the past 24 hour(s)).    Social History   Tobacco Use  Smoking Status Never Smoker  Smokeless Tobacco Never Used    Goals Met:  Independence with exercise equipment Exercise tolerated well No report of cardiac concerns or symptoms Strength training completed today  Goals Unmet:  Not Applicable  Comments: Check out 915   Dr. Kate Sable is Medical Director for [place] and Pulmonary Rehab.

## 2017-08-13 ENCOUNTER — Encounter (HOSPITAL_COMMUNITY)
Admission: RE | Admit: 2017-08-13 | Discharge: 2017-08-13 | Disposition: A | Discharge status: Home / Self Care | Payer: Commercial Managed Care - PPO | Source: Ambulatory Visit | Attending: Cardiology | Admitting: Cardiology

## 2017-08-13 DIAGNOSIS — Z951 Presence of aortocoronary bypass graft: Secondary | ICD-10-CM

## 2017-08-13 DIAGNOSIS — Z48812 Encounter for surgical aftercare following surgery on the circulatory system: Secondary | ICD-10-CM | POA: Diagnosis not present

## 2017-08-13 NOTE — Progress Notes (Signed)
Daily Session Note  Patient Details  Name: Kelly Hardy MRN: [id number] Date of Birth: 1960/12/28 Referring Provider:     [place] from 06/15/2017 in [place]  Referring Provider  DR. Hochrein      Encounter Date: 08/13/2017  Check In: Session Check In - 08/13/17 0815      Check-In   Location  [facility]    Staff Present  Diane Angelina Pih, MS, EP, [facility], Exercise Physiologist;Gregory Luther Parody, BS, EP, Exercise Physiologist;Aithan Farrelly Wynetta Emery, RN, BSN    Supervising physician immediately available to respond to emergencies  See telemetry face sheet for immediately available MD    Medication changes reported      No    Fall or balance concerns reported     No    Warm-up and Cool-down  Performed as group-led instruction    Resistance Training Performed  Yes    VAD Patient?  No      Pain Assessment   Currently in Pain?  No/denies    Pain Score  0-No pain    Multiple Pain Sites  No       Capillary Blood Glucose: No results found for this or any previous visit (from the past 24 hour(s)).    Social History   Tobacco Use  Smoking Status Never Smoker  Smokeless Tobacco Never Used    Goals Met:  Independence with exercise equipment Exercise tolerated well No report of cardiac concerns or symptoms Strength training completed today  Goals Unmet:  Not Applicable  Comments: Check out 915.   Dr. Kate Sable is Medical Director for [facility] Cardiac and Pulmonary Rehab.

## 2017-08-15 ENCOUNTER — Encounter (HOSPITAL_COMMUNITY)
Admission: RE | Admit: 2017-08-15 | Discharge: 2017-08-15 | Disposition: A | Discharge status: Home / Self Care | Payer: Commercial Managed Care - PPO | Source: Ambulatory Visit | Attending: Cardiology | Admitting: Cardiology

## 2017-08-15 DIAGNOSIS — Z951 Presence of aortocoronary bypass graft: Secondary | ICD-10-CM

## 2017-08-15 DIAGNOSIS — Z48812 Encounter for surgical aftercare following surgery on the circulatory system: Secondary | ICD-10-CM | POA: Diagnosis not present

## 2017-08-15 NOTE — Progress Notes (Signed)
Daily Session Note  Patient Details  Name: Kelly Hardy MRN: [id number] Date of Birth: 11-01-1960 Referring Provider:     [place] from 06/15/2017 in [place]  Referring Provider  DR. Hochrein      Encounter Date: 08/15/2017  Check In: Session Check In - 08/15/17 0815      Check-In   Location  [facility]    Staff Present  Suzanne Boron, BS, EP, Exercise Physiologist;Kyleeann Cremeans Wynetta Emery, RN, BSN    Supervising physician immediately available to respond to emergencies  See telemetry face sheet for immediately available MD    Medication changes reported      No    Fall or balance concerns reported     No    Warm-up and Cool-down  Performed as group-led instruction    Resistance Training Performed  Yes    VAD Patient?  No      Pain Assessment   Currently in Pain?  No/denies    Pain Score  0-No pain    Multiple Pain Sites  No       Capillary Blood Glucose: No results found for this or any previous visit (from the past 24 hour(s)).    Social History   Tobacco Use  Smoking Status Never Smoker  Smokeless Tobacco Never Used    Goals Met:  Independence with exercise equipment Exercise tolerated well No report of cardiac concerns or symptoms Strength training completed today  Goals Unmet:  Not Applicable  Comments: Check out 915.   Dr. Kate Sable is Medical Director for [facility] Cardiac and Pulmonary Rehab.

## 2017-08-17 ENCOUNTER — Encounter (HOSPITAL_COMMUNITY)
Admission: RE | Admit: 2017-08-17 | Discharge: 2017-08-17 | Disposition: A | Discharge status: Home / Self Care | Payer: Commercial Managed Care - PPO | Source: Ambulatory Visit | Attending: Cardiology | Admitting: Cardiology

## 2017-08-17 DIAGNOSIS — Z48812 Encounter for surgical aftercare following surgery on the circulatory system: Secondary | ICD-10-CM | POA: Diagnosis not present

## 2017-08-17 DIAGNOSIS — Z951 Presence of aortocoronary bypass graft: Secondary | ICD-10-CM

## 2017-08-17 NOTE — Progress Notes (Signed)
Daily Session Note  Patient Details  Name: DEANTRE BOURDON MRN: [id number] Date of Birth: 1960-05-06 Referring Provider:     [place] from 06/15/2017 in [place]  Referring Provider  DR. Hochrein      Encounter Date: 08/17/2017  Check In: Session Check In - 08/17/17 0849      Check-In   Location  [facility]    Staff Present  Suzanne Boron, BS, EP, Exercise Physiologist;Debra Wynetta Emery, RN, BSN    Supervising physician immediately available to respond to emergencies  See telemetry face sheet for immediately available MD    Medication changes reported      No    Fall or balance concerns reported     No    Warm-up and Cool-down  Performed as group-led instruction    Resistance Training Performed  Yes    VAD Patient?  No      Pain Assessment   Currently in Pain?  No/denies    Pain Score  0-No pain    Multiple Pain Sites  No       Capillary Blood Glucose: No results found for this or any previous visit (from the past 24 hour(s)).    Social History   Tobacco Use  Smoking Status Never Smoker  Smokeless Tobacco Never Used    Goals Met:  Independence with exercise equipment Exercise tolerated well No report of cardiac concerns or symptoms Strength training completed today  Goals Unmet:  Not Applicable  Comments: Check out 915   Dr. Kate Sable is Medical Director for [place] and Pulmonary Rehab.

## 2017-08-20 ENCOUNTER — Encounter (HOSPITAL_COMMUNITY)
Admission: RE | Admit: 2017-08-20 | Discharge: 2017-08-20 | Disposition: A | Discharge status: Home / Self Care | Payer: Commercial Managed Care - PPO | Source: Ambulatory Visit | Attending: Cardiology | Admitting: Cardiology

## 2017-08-20 DIAGNOSIS — Z951 Presence of aortocoronary bypass graft: Secondary | ICD-10-CM

## 2017-08-20 DIAGNOSIS — Z48812 Encounter for surgical aftercare following surgery on the circulatory system: Secondary | ICD-10-CM | POA: Diagnosis not present

## 2017-08-20 NOTE — Progress Notes (Signed)
Daily Session Note  Patient Details  Name: Kelly Hardy MRN: [id number] Date of Birth: 08/12/1960 Referring Provider:     [facility]ARDIAC REHAB PHASE II ORIENTATION from 06/15/2017 in [place] [facility]  Referring Provider  DR. Hochrein      Encounter Date: 08/20/2017  Check In: Session Check In - 08/20/17 0815      Check-In   Location  [facility]    Staff Present  Gregory Cowan, BS, EP, Exercise Physiologist;Debra Johnson, RN, BSN;Diane Coad, MS, EP, [facility], Exercise Physiologist    Supervising physician immediately available to respond to emergencies  See telemetry face sheet for immediately available MD    Medication changes reported      No    Fall or balance concerns reported     No    Warm-up and Cool-down  Performed as group-led instruction    Resistance Training Performed  Yes    VAD Patient?  No      Pain Assessment   Currently in Pain?  No/denies    Pain Score  0-No pain    Multiple Pain Sites  No       Capillary Blood Glucose: No results found for this or any previous visit (from the past 24 hour(s)).    Social History   Tobacco Use  Smoking Status Never Smoker  Smokeless Tobacco Never Used    Goals Met:  Independence with exercise equipment Exercise tolerated well No report of cardiac concerns or symptoms Strength training completed today  Goals Unmet:  Not Applicable  Comments: Check out 915.   Dr. Suresh Koneswaran is Medical Director for [place] [facility] and Pulmonary Rehab. 

## 2017-08-21 NOTE — Progress Notes (Signed)
Cardiac Individual Treatment Plan  Patient Details  Name: Kelly Hardy MRN: [id number] Date of Birth: [birth year] Referring Provider:     CARDIAC REHAB PHASE II ORIENTATION from 06/15/2017 in [place]  Referring Provider  DR. Hochrein      Initial Encounter Date:    CARDIAC REHAB PHASE II ORIENTATION from 06/15/2017 in [place]  Date  06/15/17  Referring Provider  DR. Hochrein      Visit Diagnosis: S/P CABG x 1  Patient's Home Medications on Admission:  Current Outpatient Medications:  .  acetaminophen (TYLENOL) 500 MG tablet, Take 2 tablets (1,000 mg total) by mouth every 8 (eight) hours as needed., Disp: 30 tablet, Rfl: 0 .  aspirin EC 81 MG tablet, Take 81 mg by mouth daily., Disp: , Rfl:  .  insulin lispro protamine-lispro (HUMALOG 75/25 MIX) (75-25) 100 UNIT/ML SUSP injection, Inject 12 Units into the skin 2 (two) times daily with a meal., Disp: , Rfl:  .  insulin regular (NOVOLIN R,HUMULIN R) 100 units/mL injection, Inject 2-10 Units into the skin 3 (three) times daily before meals. Per Sliding Scale, Disp: , Rfl:  .  losartan (COZAAR) 25 MG tablet, Take 1 tablet (25 mg total) by mouth daily., Disp: 90 tablet, Rfl: 3 .  metoprolol succinate (TOPROL-XL) 25 MG 24 hr tablet, Take 25 mg by mouth daily., Disp: , Rfl:  .  Multiple Vitamins-Minerals (MULTIVITAMIN WITH MINERALS) tablet, Take 1 tablet by mouth daily., Disp: , Rfl:  .  NOVOFINE 32G X 6 MM MISC, USE TO ADMINSTER INSULIN SIX TIMES DAILY., Disp: , Rfl: 4 .  oxyCODONE (OXY IR/ROXICODONE) 5 MG immediate release tablet, Take 1 tablet (5 mg total) by mouth every 4 (four) hours as needed for severe pain., Disp: 30 tablet, Rfl: 0 .  rosuvastatin (CRESTOR) 40 MG tablet, TAKE ONE TABLET BY MOUTH AT BEDTIME., Disp: 30 tablet, Rfl: 11  Past Medical History: Past Medical History:  Diagnosis Date  . Coronary artery disease   . Diabetes mellitus without complication (HCC)    Type I   . GERD (gastroesophageal reflux disease)   . Hyperlipidemia   . Hypertension   . Palpitations    eval 2014    Tobacco Use: Social History   Tobacco Use  Smoking Status Never Smoker  Smokeless Tobacco Never Used    Labs: Recent Review Flowsheet Data    Labs for ITP Cardiac and Pulmonary Rehab Latest Ref Rng & Units 04/04/2017 04/04/2017 04/04/2017 04/04/2017 04/05/2017   Hemoglobin A1c 4.8 - 5.6 % - - - - -   PHART 7.350 - 7.450 7.405 - 7.277(L) - -   PCO2ART 32.0 - 48.0 mmHg 31.5(L) - 46.3 - -   HCO3 20.0 - 28.0 mmol/L 19.8(L) - 21.7 - -   TCO2 22 - 32 mmol/L 21(L) 22 23 23 26    ACIDBASEDEF 0.0 - 2.0 mmol/L 4.0(H) - 5.0(H) - -   O2SAT % 100.0 - 100.0 - -      Capillary Blood Glucose: Lab Results  Component Value Date   GLUCAP 177 (H) 04/08/2017   GLUCAP 226 (H) 04/07/2017   GLUCAP 161 (H) 04/07/2017   GLUCAP 202 (H) 04/07/2017   GLUCAP 247 (H) 04/07/2017     Exercise Target Goals:    Exercise Program Goal: Individual exercise prescription set using results from initial 6 min walk test and THRR while considering  patient's activity barriers and safety.   Exercise Prescription Goal: Starting with aerobic activity 30 plus  minutes a day, 3 days per week for initial exercise prescription. Provide home exercise prescription and guidelines that participant acknowledges understanding prior to discharge.  Activity Barriers & Risk Stratification: Activity Barriers & Cardiac Risk Stratification - 06/15/17 1343      Activity Barriers & Cardiac Risk Stratification   Activity Barriers  None    Cardiac Risk Stratification  High       6 Minute Walk: 6 Minute Walk    Row Name 06/15/17 1342         6 Minute Walk   Phase  Initial     Distance  1550 feet     Distance % Change  0 %     Distance Feet Change  0 ft     Walk Time  6 minutes     # of Rest Breaks  0     MPH  2.93     METS  3.25     RPE  9     Perceived Dyspnea   9     VO2 Peak  17.19     Symptoms  No      Resting HR  109 bpm     Resting BP  112/60     Resting Oxygen Saturation   98 %     Exercise Oxygen Saturation  during 6 min walk  98 %     Max Ex. HR  121 bpm     Max Ex. BP  162/70     2 Minute Post BP  120/62        Oxygen Initial Assessment:   Oxygen Re-Evaluation:   Oxygen Discharge (Final Oxygen Re-Evaluation):   Initial Exercise Prescription: Initial Exercise Prescription - 06/15/17 1300      Date of Initial Exercise RX and Referring Provider   Date  06/15/17    Referring Provider  DR. Hochrein      Treadmill   MPH  2    Grade  0    Minutes  15    METs  2.5      Recumbant Elliptical   Level  1    RPM  30    Watts  34    Minutes  20    METs  2      Prescription Details   Frequency (times per week)  3    Duration  Progress to 30 minutes of continuous aerobic without signs/symptoms of physical distress      Intensity   THRR 40-80% of Max Heartrate  131-141-151    Ratings of Perceived Exertion  11-13    Perceived Dyspnea  0-4      Progression   Progression  Continue progressive overload as per policy without signs/symptoms or physical distress.      Resistance Training   Training Prescription  Yes    Weight  1    Reps  10-15       Perform Capillary Blood Glucose checks as needed.  Exercise Prescription Changes:  Exercise Prescription Changes    Row Name 06/19/17 1500 06/28/17 1500 07/23/17 1400 08/07/17 0800 08/17/17 1500     Response to Exercise   Blood Pressure (Admit)  128/50  120/50  110/56  118/54  110/56   Blood Pressure (Exercise)  140/60  150/60  150/60  150/60  150/62   Blood Pressure (Exit)  100/50  100/50  100/50  106/50  100/50   Heart Rate (Admit)  87 bpm  64 bpm  90 bpm  69  bpm  73 bpm   Heart Rate (Exercise)  109 bpm  106 bpm  116 bpm  115 bpm  109 bpm   Heart Rate (Exit)  104 bpm  74 bpm  100 bpm  80 bpm  83 bpm   Rating of Perceived Exertion (Exercise)  11  12  10  10  10    Duration  Progress to 30 minutes of  aerobic  without signs/symptoms of physical distress  Progress to 30 minutes of  aerobic without signs/symptoms of physical distress  Progress to 30 minutes of  aerobic without signs/symptoms of physical distress  Progress to 30 minutes of  aerobic without signs/symptoms of physical distress  Progress to 30 minutes of  aerobic without signs/symptoms of physical distress   Intensity  THRR New 117-133-148  THRR New 104-123-143  THRR New 119-134-148  THRR New 107-125-144  THRR New 109-127-145     Progression   Progression  Continue to progress workloads to maintain intensity without signs/symptoms of physical distress.  Continue to progress workloads to maintain intensity without signs/symptoms of physical distress.  Continue to progress workloads to maintain intensity without signs/symptoms of physical distress.  Continue to progress workloads to maintain intensity without signs/symptoms of physical distress.  Continue to progress workloads to maintain intensity without signs/symptoms of physical distress.     Resistance Training   Training Prescription  Yes  Yes  Yes  Yes  Yes   Weight  1  2  2  4  5    Reps  10-15  10-15  10-15  10-15  10-15     Treadmill   MPH  2  2.4  2.7  3  3.1   Grade  0  0  0  0  0   Minutes  20  20  20  20  20    METs  2.5  2.83  3.06  3.2  3.3     Recumbant Elliptical   Level  1  1  2  2  2    RPM  37  48  62  64  65   Watts  44  57  91  95  93   Minutes  15  15  15  15  15    METs  2.6  3.1  5.1  5.2  5     Home Exercise Plan   Plans to continue exercise at  Home (comment)  Home (comment)  Home (comment)  Home (comment)  Home (comment)   Frequency  Add 2 additional days to program exercise sessions.  Add 2 additional days to program exercise sessions.  Add 2 additional days to program exercise sessions.  Add 2 additional days to program exercise sessions.  Add 2 additional days to program exercise sessions.   Initial Home Exercises Provided  06/19/17  06/19/17  06/19/17   06/19/17  06/19/17      Exercise Comments:  Exercise Comments    Row Name 06/19/17 1510 06/28/17 1523 07/23/17 1431 08/07/17 0805 08/17/17 1511   Exercise Comments  Patient has just started CR and will be progressed in time.   Patient is doing well in Cr and has progressed his speed on the treadmill machine and has also maintained his level and SPMs on teh recumbent elliptical. Patient has still only just started the program and will be progressed more in time by the staff here at [facility].   Patient continues to do well in CR. Patient has increased his speed on  the treadmill to 2.5mph and has also been able to increase his level as well as watts and METs on the recumbent elliptical. Patient has stated to me that he feels that he has regained some stamina since beginning the program. Patient will continued to be monitored throughout the remainder of the program.   Patient is doing well in CR. Patient has increased his speed on the treadmill to 3.0 and has maintained his level on the recumbent elliptical. Patient has stated tha he has more stamina since being on the machines than as opposed to before. Patient has more energy for work and other activities when he is not here in [place] now.   Patient continues to do well in CR. Patient has increased his dumbell size to 5lbs to increase his strength even further and he has increased his speed on the treadmill to 3.1. Patient has also maintained his level and SPMs on the recumbent elliptical. 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       Expected Outcomes  Short Term: Attend rehab on a regular basis to increase amount of physical activity.       Increase Strength and Stamina  Yes       Intervention  Provide advice, education, support and counseling about physical activity/exercise needs.;Develop an individualized exercise prescription for aerobic and resistive training based on initial evaluation findings, risk stratification, comorbidities and participant's personal goals.       Expected Outcomes  Short Term: Increase workloads from initial exercise prescription for resistance, speed, and METs.       Able to understand and use rate of perceived exertion (RPE) scale  Yes       Intervention  Provide education and explanation on how to use RPE scale       Expected Outcomes  Short Term: Able to use RPE daily in rehab to express subjective intensity level;Long Term:  Able to use RPE to guide intensity level when exercising independently       Able to understand and use Dyspnea scale  Yes       Intervention  Provide education and explanation on how to use Dyspnea scale       Expected Outcomes  Long Term: Able to use Dyspnea scale to guide intensity level when exercising independently;Short Term: Able to use Dyspnea scale daily in rehab to express subjective sense of shortness of breath during exertion       Knowledge and understanding of Target Heart Rate Range (THRR)  Yes       Intervention  Provide education and explanation of THRR including how the numbers were predicted and where they are located for reference       Expected Outcomes  Short Term: Able to state/look up THRR;Long Term: Able to use THRR to govern intensity when exercising independently;Short Term: Able to use daily as guideline for intensity in rehab       Able to check  pulse independently  Yes       Intervention  Provide education and demonstration on how to  check pulse in carotid and radial arteries.;Review the importance of being able to check your own pulse for safety during independent exercise       Expected Outcomes  Short Term: Able to explain why pulse checking is important during independent exercise;Long Term: Able to check pulse independently and accurately       Understanding of Exercise Prescription  Yes       Intervention  Provide education, explanation, and written materials on patient's individual exercise prescription       Expected Outcomes  Short Term: Able to explain program exercise prescription;Long Term: Able to explain home exercise prescription to exercise independently          Exercise Goals Re-Evaluation : Exercise Goals Re-Evaluation    Row Name 06/28/17 1521 07/23/17 1430 08/17/17 1510         Exercise Goal Re-Evaluation   Exercise Goals Review  Increase Physical Activity;Increase Strength and Stamina;Able to understand and use rate of perceived exertion (RPE) scale;Able to check pulse independently;Knowledge and understanding of Target Heart Rate Range (THRR);Able to understand and use Dyspnea scale;Understanding of Exercise Prescription  Increase Physical Activity;Increase Strength and Stamina;Able to understand and use rate of perceived exertion (RPE) scale;Able to check pulse independently;Knowledge and understanding of Target Heart Rate Range (THRR);Able to understand and use Dyspnea scale;Understanding of Exercise Prescription  Increase Physical Activity;Increase Strength and Stamina;Able to understand and use rate of perceived exertion (RPE) scale;Able to check pulse independently;Knowledge and understanding of Target Heart Rate Range (THRR);Able to understand and use Dyspnea scale;Understanding of Exercise Prescription     Comments  Patient is doing well in [facility] and has progressed his speed on the treadmill machine and has also maintained his level and SPMs on teh recumbent elliptical. Patient has still only just  started the program and will be progressed more in time by the staff here at [facility].   Patient continues to do well in CR. Patient has increased his speed on the treadmill to 2.68mph and has also been able to increase his level as well as watts and METs on the recumbent elliptical. Patient has stated to me that he feels that he has regained some stamina since beginning the program. Patient will continued to be monitored throughout the remainder of the program.   Patient continues to do well in CR. Patient has increased his dumbell size to 5lbs to increase his strength even further and he has increased his speed on the treadmill to 3.1. Patient has also maintained his level and SPMs on the recumbent elliptical. 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   Patient wishes to regain stamina and to get more education about nutrition and exercise.   Patient wishes to regain stamina and to get more education about nutrition and exercise.          Discharge Exercise Prescription (Final Exercise Prescription Changes): Exercise Prescription Changes - 08/17/17 1500      Response to Exercise   Blood Pressure (Admit)  110/56    Blood Pressure (Exercise)  150/62    Blood Pressure (Exit)  100/50    Heart Rate (Admit)  73 bpm    Heart Rate (  Exercise)  109 bpm    Heart Rate (Exit)  83 bpm    Rating of Perceived Exertion (Exercise)  10    Duration  Progress to 30 minutes of  aerobic without signs/symptoms of physical distress    Intensity  THRR New [phone]      Progression   Progression  Continue to progress workloads to maintain intensity without signs/symptoms of physical distress.      Resistance Training   Training Prescription  Yes    Weight  5    Reps  10-15      Treadmill   MPH  3.1    Grade   0    Minutes  20    METs  3.3      Recumbant Elliptical   Level  2    RPM  65    Watts  93    Minutes  15    METs  5      Home Exercise Plan   Plans to continue exercise at  Home (comment)    Frequency  Add 2 additional days to program exercise sessions.    Initial Home Exercises Provided  06/19/17       Nutrition:  Target Goals: Understanding of nutrition guidelines, daily intake of sodium 1500mg , cholesterol 200mg , calories 30% from fat and 7% or less from saturated fats, daily to have 5 or more servings of fruits and vegetables.  Biometrics: Pre Biometrics - 06/15/17 1345      Pre Biometrics   Height  5\' 9"  (1.753 m)    Weight  147 lb (66.7 kg)    Waist Circumference  29 inches    Hip Circumference  33 inches    Waist to Hip Ratio  0.88 %    BMI (Calculated)  21.7    Triceps Skinfold  10 mm    % Body Fat  18.1 %    Grip Strength  64.06 kg    Flexibility  13.66 in    Single Leg Stand  60 seconds        Nutrition Therapy Plan and Nutrition Goals: Nutrition Therapy & Goals - 08/21/17 1132      Nutrition Therapy   RD appointment deferred  Yes      Personal Nutrition Goals   Personal Goal #2  Patient continues to say he eats heart healthy.    Additional Goals?  No       Nutrition Assessments: Nutrition Assessments - 06/15/17 1501      MEDFICTS Scores   Pre Score  33       Nutrition Goals Re-Evaluation:   Nutrition Goals Discharge (Final Nutrition Goals Re-Evaluation):   Psychosocial: Target Goals: Acknowledge presence or absence of significant depression and/or stress, maximize coping skills, provide positive support system. Participant is able to verbalize types and ability to use techniques and skills needed for reducing stress and depression.  Initial Review & Psychosocial Screening: Initial Psych Review & Screening - 06/15/17 1509      Initial Review   Current issues with  None Identified      Family Dynamics   Good Support System?  Yes       Barriers   Psychosocial barriers to participate in program  There are no identifiable barriers or psychosocial needs.      Screening Interventions   Interventions  Encouraged to exercise    Expected Outcomes  Short Term goal: Identification and review with participant of any Quality of Life or Depression concerns found  by scoring the questionnaire.;Long Term goal: The participant improves quality of Life and PHQ9 Scores as seen by post scores and/or verbalization of changes       Quality of Life Scores: Quality of Life - 06/15/17 1400      Quality of Life Scores   Health/Function Pre  23.83 %    Socioeconomic Pre  28.07 %    Psych/Spiritual Pre  27.86 %    Family Pre  25.2 %    GLOBAL Pre  25.74 %      Scores of 19 and below usually indicate a poorer quality of life in these areas.  A difference of  2-3 points is a clinically meaningful difference.  A difference of 2-3 points in the total score of the Quality of Life Index has been associated with significant improvement in overall quality of life, self-image, physical symptoms, and general health in studies assessing change in quality of life.  PHQ-9: Recent Review Flowsheet Data    Depression screen [facility] 2/9 06/15/2017   Decreased Interest 0   Down, Depressed, Hopeless 0   PHQ - 2 Score 0   Altered sleeping 0   Tired, decreased energy 1   Change in appetite 0   Feeling bad or failure about yourself  0   Trouble concentrating 0   Moving slowly or fidgety/restless 0   Suicidal thoughts 0   PHQ-9 Score 1   Difficult doing work/chores Not difficult at all     Interpretation of Total Score  Total Score Depression Severity:  1-4 = Minimal depression, 5-9 = Mild depression, 10-14 = Moderate depression, 15-19 = Moderately severe depression, 20-27 = Severe depression   Psychosocial Evaluation and Intervention: Psychosocial Evaluation - 06/15/17 1511      Psychosocial Evaluation & Interventions   Interventions  Encouraged  to exercise with the program and follow exercise prescription    Continue Psychosocial Services   No Follow up required       Psychosocial Re-Evaluation: Psychosocial Re-Evaluation    [place] Name 06/15/17 1511 07/04/17 1339 08/01/17 1339 08/21/17 1137       Psychosocial Re-Evaluation   Current issues with  None Identified  None Identified  None Identified  None Identified    Comments  -  Patient's initial QOL score was 25.74 and his PHQ-9 score was 1 with no psychosocial issues identified.   Patient's initial QOL score was 25.74 and his PHQ-9 score was 1 with no psychosocial issues identified.   Patient's initial QOL score was 25.74 and his PHQ-9 score was 1 with no psychosocial issues identified.     Expected Outcomes  -  Patient will have no psychosocial issues identified at discharge.   Patient will have no psychosocial issues identified at discharge.   Patient will have no psychosocial issues identified at discharge.     Interventions  -  Stress management education;Encouraged to attend Cardiac Rehabilitation for the exercise;Relaxation education  Stress management education;Encouraged to attend Cardiac Rehabilitation for the exercise;Relaxation education  Stress management education;Encouraged to attend Cardiac Rehabilitation for the exercise;Relaxation education    Continue Psychosocial Services   No Follow up required  No Follow up required  No Follow up required  No Follow up required       Psychosocial Discharge (Final Psychosocial Re-Evaluation): Psychosocial Re-Evaluation - 08/21/17 1137      Psychosocial Re-Evaluation   Current issues with  None Identified    Comments  Patient's initial QOL score was 25.74 and his PHQ-9 score was  1 with no psychosocial issues identified.     Expected Outcomes  Patient will have no psychosocial issues identified at discharge.     Interventions  Stress management education;Encouraged to attend Cardiac Rehabilitation for the exercise;Relaxation  education    Continue Psychosocial Services   No Follow up required       Vocational Rehabilitation: Provide vocational rehab assistance to qualifying candidates.   Vocational Rehab Evaluation & Intervention: Vocational Rehab - 06/15/17 1459      Initial Vocational Rehab Evaluation & Intervention   Assessment shows need for Vocational Rehabilitation  No       Education: Education Goals: Education classes will be provided on a weekly basis, covering required topics. Participant will state understanding/return demonstration of topics presented.  Learning Barriers/Preferences: Learning Barriers/Preferences - 06/15/17 1458      Learning Barriers/Preferences   Learning Barriers  None    Learning Preferences  Audio;Computer/Internet;Group Instruction;Individual Instruction;Pictoral;Skilled Demonstration;Verbal Instruction;Video;Written Material       Education Topics: Hypertension, Hypertension Reduction -Define heart disease and high blood pressure. Discus how high blood pressure affects the body and ways to reduce high blood pressure.   CARDIAC REHAB PHASE II EXERCISE from 08/15/2017 in [place]  Date  08/08/17  Educator  Russella Dar  Instruction Review Code  2- Demonstrated Understanding      Exercise and Your Heart -Discuss why it is important to exercise, the FITT principles of exercise, normal and abnormal responses to exercise, and how to exercise safely.   CARDIAC REHAB PHASE II EXERCISE from 08/15/2017 in [place]  Date  08/15/17  Educator  [place]  Instruction Review Code  2- Demonstrated Understanding      Angina -Discuss definition of angina, causes of angina, treatment of angina, and how to decrease risk of having angina.   Cardiac Medications -Review what the following cardiac medications are used for, how they affect the body, and side effects that may occur when taking the medications.  Medications include  Aspirin, Beta blockers, calcium channel blockers, ACE Inhibitors, angiotensin receptor blockers, diuretics, digoxin, and antihyperlipidemics.   Congestive Heart Failure -Discuss the definition of CHF, how to live with CHF, the signs and symptoms of CHF, and how keep track of weight and sodium intake.   Heart Disease and Intimacy -Discus the effect sexual activity has on the heart, how changes occur during intimacy as we age, and safety during sexual activity.   Smoking Cessation / COPD -Discuss different methods to quit smoking, the health benefits of quitting smoking, and the definition of COPD.   CARDIAC REHAB PHASE II EXERCISE from 08/15/2017 in [place]  Date  06/21/17  Educator  DC  Instruction Review Code  2- Demonstrated Understanding      Nutrition I: Fats -Discuss the types of cholesterol, what cholesterol does to the heart, and how cholesterol levels can be controlled.   CARDIAC REHAB PHASE II EXERCISE from 08/15/2017 in [place]  Date  06/28/17  Educator  DC  Instruction Review Code  2- Demonstrated Understanding      Nutrition II: Labels -Discuss the different components of food labels and how to read food label   CARDIAC REHAB PHASE II EXERCISE from 08/15/2017 in [place]  Date  07/04/17  Educator  DJ  Instruction Review Code  2- Demonstrated Understanding      Heart Parts/Heart Disease and PAD -Discuss the anatomy of the heart, the pathway of blood circulation through the  heart, and these are affected by heart disease.   CARDIAC REHAB PHASE II EXERCISE from 08/15/2017 in [place]  Date  07/11/17  Educator  DJ  Instruction Review Code  2- Demonstrated Understanding      Stress I: Signs and Symptoms -Discuss the causes of stress, how stress may lead to anxiety and depression, and ways to limit stress.   Stress II: Relaxation -Discuss different types of  relaxation techniques to limit stress.   CARDIAC REHAB PHASE II EXERCISE from 08/15/2017 in [place]  Date  07/25/17  Educator  DJ  Instruction Review Code  2- Demonstrated Understanding      Warning Signs of Stroke / TIA -Discuss definition of a stroke, what the signs and symptoms are of a stroke, and how to identify when someone is having stroke.   CARDIAC REHAB PHASE II EXERCISE from 08/15/2017 in [place]  Date  08/01/17  Educator  DC  Instruction Review Code  2- Demonstrated Understanding      Knowledge Questionnaire Score: Knowledge Questionnaire Score - 06/15/17 1459      Knowledge Questionnaire Score   Pre Score  21/24       Core Components/Risk Factors/Patient Goals at Admission: Personal Goals and Risk Factors at Admission - 06/15/17 1501      Core Components/Risk Factors/Patient Goals on Admission    Weight Management  Weight Maintenance    Diabetes  Yes    Expected Outcomes  Long Term: Attainment of HbA1C < 7%.;Short Term: Participant verbalizes understanding of the signs/symptoms and immediate care of hyper/hypoglycemia, proper foot care and importance of medication, aerobic/resistive exercise and nutrition plan for blood glucose control.    Personal Goal Other  Yes    Personal Goal  Regain Stamina, More education about exercise, nutrition    Intervention  Attend 3 x week and supplement home exercise 2 x week.    Expected Outcomes  Reach goals       Core Components/Risk Factors/Patient Goals Review:  Goals and Risk Factor Review    Row Name 07/04/17 1336 08/01/17 1336 08/21/17 1132         Core Components/Risk Factors/Patient Goals Review   Personal Goals Review  Weight Management/Obesity Regain stamina; more education about exercise/nutrition.   Weight Management/Obesity Regain stamina; more education about exercise/nutrition.  Weight Management/Obesity Regain stamina; more education about exercise/nutrition.      Review  Patient has completed 9 sessions gaining 1 lb since he started the program. His reported fasting glucose readings have been labile. His last A1C was 04/02/17 at 8.5. He says he feels his flexiblity has improved since he started. He has returned to work. Will continue to monitor for progress.   Patient has completed 19 sessions gaining 2.3 lbs since he started the program. His reported fasting glucose readings continue to fluctuate. No recent A1C avaliable. He is doing well in the program with progressions. He says he is feeling stronger and is able to work without difficulty. 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 He says he does not get as tired after working all day. Will continue to monitor for progress.   Patient has completed 27 sessions gaining 1 lb since last 30 day review. He continues to do well in the program. His reported glucose readings continue to fluctuate. He reports his last A1C was in January at 6. He is scheduled to have another one in June.  Patient continues to work fulltime without getting fatigued. 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 He says he is not having any problems doing this. He says he has more stamina and energy and feels the program has helped him a lot. Will continue to monitor for progress.      Expected Outcomes  Patient will continue to attend sessions and complete the program meeting his personal goals.   Patient will continue to attend sessions and complete the program meeting his personal goals.   Patient will continue to attend sessions and complete the program meeting his personal goals.         Core Components/Risk Factors/Patient Goals at Discharge (Final Review):  Goals and Risk Factor Review - 08/21/17 1132      Core Components/Risk Factors/Patient Goals Review   Personal Goals Review  Weight Management/Obesity Regain stamina; more education about  exercise/nutrition.    Review  Patient has completed 27 sessions gaining 1 lb since last 30 day review. He continues to do well in the program. His reported glucose readings continue to fluctuate. He reports his last A1C was in January at 6. He is scheduled to have another one in June. Patient continues to work fulltime without getting fatigued. 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 He says he is not having any problems doing this. He says he has more stamina and energy and feels the program has helped him a lot. Will continue to monitor for progress.     Expected Outcomes  Patient will continue to attend sessions and complete the program meeting his personal goals.        ITP Comments:   Comments: ITP 30 Day REVIEW Patient doing well in the program. Will continue to monitor for progress.

## 2017-08-22 ENCOUNTER — Encounter (HOSPITAL_COMMUNITY)
Admission: RE | Admit: 2017-08-22 | Discharge: 2017-08-22 | Disposition: A | Discharge status: Home / Self Care | Payer: Commercial Managed Care - PPO | Source: Ambulatory Visit | Attending: Cardiology | Admitting: Cardiology

## 2017-08-22 DIAGNOSIS — Z48812 Encounter for surgical aftercare following surgery on the circulatory system: Secondary | ICD-10-CM | POA: Diagnosis not present

## 2017-08-22 NOTE — Progress Notes (Signed)
Daily Session Note  Patient Details  Name: Kelly Hardy MRN: [id number] Date of Birth: [birth year] Referring Provider:     [place] from 06/15/2017 in [place]  Referring Provider  DR. Hochrein      Encounter Date: 08/22/2017  Check In: Session Check In - 08/22/17 0815      Check-In   Location  [facility]    Staff Present  Russella Dar, MS, EP, [facility], Exercise Physiologist;Gregory Luther Parody, BS, EP, Exercise Physiologist    Supervising physician immediately available to respond to emergencies  See telemetry face sheet for immediately available MD    Medication changes reported      No    Fall or balance concerns reported     No    Warm-up and Cool-down  Performed as group-led instruction    Resistance Training Performed  Yes    VAD Patient?  No      Pain Assessment   Currently in Pain?  No/denies    Pain Score  0-No pain    Multiple Pain Sites  No       Capillary Blood Glucose: No results found for this or any previous visit (from the past 24 hour(s)).    Social History   Tobacco Use  Smoking Status Never Smoker  Smokeless Tobacco Never Used    Goals Met:  Independence with exercise equipment Exercise tolerated well No report of cardiac concerns or symptoms Strength training completed today  Goals Unmet:  Not Applicable  Comments: Check out: 0915   Dr. Kate Sable is Medical Director for [place] and Pulmonary Rehab.

## 2017-08-24 ENCOUNTER — Encounter (HOSPITAL_COMMUNITY)
Admission: RE | Admit: 2017-08-24 | Discharge: 2017-08-24 | Disposition: A | Discharge status: Home / Self Care | Payer: Commercial Managed Care - PPO | Source: Ambulatory Visit | Attending: Cardiology | Admitting: Cardiology

## 2017-08-24 DIAGNOSIS — Z48812 Encounter for surgical aftercare following surgery on the circulatory system: Secondary | ICD-10-CM | POA: Diagnosis not present

## 2017-08-24 DIAGNOSIS — Z951 Presence of aortocoronary bypass graft: Secondary | ICD-10-CM

## 2017-08-24 NOTE — Progress Notes (Signed)
Daily Session Note  Patient Details  Name: Kelly Hardy MRN: [id number] Date of Birth: 05/12/1960 Referring Provider:     CARDIAC REHAB PHASE II ORIENTATION from 06/15/2017 in [place] [facility]  Referring Provider  DR. Hochrein      Encounter Date: 08/24/2017  Check In: Session Check In - 08/24/17 0815      Check-In   Location  [facility]    Staff Present  Diane Coad, MS, EP, [facility], Exercise Physiologist;Gregory Cowan, BS, EP, Exercise Physiologist;Debra Johnson, RN, BSN    Supervising physician immediately available to respond to emergencies  See telemetry face sheet for immediately available MD    Medication changes reported      No    Fall or balance concerns reported     No    Warm-up and Cool-down  Performed as group-led instruction    Resistance Training Performed  Yes    VAD Patient?  No      Pain Assessment   Currently in Pain?  No/denies    Pain Score  0-No pain    Multiple Pain Sites  No       Capillary Blood Glucose: No results found for this or any previous visit (from the past 24 hour(s)).    Social History   Tobacco Use  Smoking Status Never Smoker  Smokeless Tobacco Never Used    Goals Met:  Independence with exercise equipment Exercise tolerated well No report of cardiac concerns or symptoms Strength training completed today  Goals Unmet:  Not Applicable  Comments: Check out 915.   Dr. Suresh Koneswaran is Medical Director for [place] [facility] and Pulmonary Rehab. 

## 2017-08-27 ENCOUNTER — Encounter (HOSPITAL_COMMUNITY): Payer: Commercial Managed Care - PPO

## 2017-08-29 ENCOUNTER — Encounter (HOSPITAL_COMMUNITY)
Admission: RE | Admit: 2017-08-29 | Discharge: 2017-08-29 | Disposition: A | Discharge status: Home / Self Care | Payer: Commercial Managed Care - PPO | Source: Ambulatory Visit | Attending: Cardiology | Admitting: Cardiology

## 2017-08-29 DIAGNOSIS — Z48812 Encounter for surgical aftercare following surgery on the circulatory system: Secondary | ICD-10-CM | POA: Diagnosis not present

## 2017-08-29 NOTE — Progress Notes (Signed)
Daily Session Note  Patient Details  Name: Kelly Hardy MRN: [id number] Date of Birth: 1960-07-18 Referring Provider:     [place] from 06/15/2017 in [place]  Referring Provider  DR. Hochrein      Encounter Date: 08/29/2017  Check In: Session Check In - 08/29/17 0815      Check-In   Location  [facility]    Staff Present  Russella Dar, MS, EP, [facility], Exercise Physiologist;Gregory Luther Parody, BS, EP, Exercise Physiologist    Supervising physician immediately available to respond to emergencies  See telemetry face sheet for immediately available MD    Medication changes reported      No    Fall or balance concerns reported     No    Warm-up and Cool-down  Performed as group-led instruction    Resistance Training Performed  Yes    VAD Patient?  No      Pain Assessment   Currently in Pain?  No/denies    Pain Score  0-No pain    Multiple Pain Sites  No       Capillary Blood Glucose: No results found for this or any previous visit (from the past 24 hour(s)).    Social History   Tobacco Use  Smoking Status Never Smoker  Smokeless Tobacco Never Used    Goals Met:  Independence with exercise equipment Exercise tolerated well No report of cardiac concerns or symptoms Strength training completed today  Goals Unmet:  Not Applicable  Comments: Check out: 0915   Dr. Kate Sable is Medical Director for [place] and Pulmonary Rehab.

## 2017-08-31 ENCOUNTER — Encounter (HOSPITAL_COMMUNITY)
Admission: RE | Admit: 2017-08-31 | Discharge: 2017-08-31 | Disposition: A | Discharge status: Home / Self Care | Payer: Commercial Managed Care - PPO | Source: Ambulatory Visit | Attending: Cardiology | Admitting: Cardiology

## 2017-08-31 DIAGNOSIS — Z951 Presence of aortocoronary bypass graft: Secondary | ICD-10-CM

## 2017-08-31 DIAGNOSIS — Z48812 Encounter for surgical aftercare following surgery on the circulatory system: Secondary | ICD-10-CM | POA: Diagnosis not present

## 2017-08-31 NOTE — Progress Notes (Addendum)
Daily Session Note  Patient Details  Name: Kelly Hardy MRN: [id number] Date of Birth: 1960/04/22 Referring Provider:     [place] from 06/15/2017 in [place]  Referring Provider  DR. Hochrein      Encounter Date: 08/31/2017  Check In: Session Check In - 08/31/17 0815      Check-In   Location  [facility]    Staff Present  Suzanne Boron, BS, EP, Exercise Physiologist;Shawnice Tilmon Wynetta Emery, RN, BSN    Supervising physician immediately available to respond to emergencies  See telemetry face sheet for immediately available MD    Medication changes reported      No    Fall or balance concerns reported     No    Warm-up and Cool-down  Performed as group-led instruction    Resistance Training Performed  Yes    VAD Patient?  No      Pain Assessment   Currently in Pain?  No/denies    Pain Score  0-No pain    Multiple Pain Sites  No       Capillary Blood Glucose: No results found for this or any previous visit (from the past 24 hour(s)).    Social History   Tobacco Use  Smoking Status Never Smoker  Smokeless Tobacco Never Used    Goals Met:  Independence with exercise equipment Exercise tolerated well No report of cardiac concerns or symptoms Strength training completed today  Goals Unmet:  Not Applicable  Comments: Check out 915..   Dr. Kate Sable is Medical Director for [place] and Pulmonary Rehab.

## 2017-09-03 ENCOUNTER — Encounter (HOSPITAL_COMMUNITY)
Admission: RE | Admit: 2017-09-03 | Discharge: 2017-09-03 | Disposition: A | Discharge status: Home / Self Care | Payer: Commercial Managed Care - PPO | Source: Ambulatory Visit | Attending: Cardiology | Admitting: Cardiology

## 2017-09-03 DIAGNOSIS — Z48812 Encounter for surgical aftercare following surgery on the circulatory system: Secondary | ICD-10-CM | POA: Diagnosis present

## 2017-09-03 DIAGNOSIS — Z951 Presence of aortocoronary bypass graft: Secondary | ICD-10-CM | POA: Diagnosis not present

## 2017-09-03 NOTE — Progress Notes (Signed)
Daily Session Note  Patient Details  Name: Kelly Hardy MRN: [id number] Date of Birth: 08/08/1960 Referring Provider:     [place] from 06/15/2017 in [place]  Referring Provider  DR. Hochrein      Encounter Date: 09/03/2017  Check In: Session Check In - 09/03/17 0851      Check-In   Location  [facility]    Staff Present  Suzanne Boron, BS, EP, Exercise Physiologist;Debra Wynetta Emery, RN, BSN;Diane Coad, MS, EP, [facility], Exercise Physiologist    Supervising physician immediately available to respond to emergencies  See telemetry face sheet for immediately available MD    Medication changes reported      No    Fall or balance concerns reported     No    Warm-up and Cool-down  Performed as group-led instruction    Resistance Training Performed  Yes    VAD Patient?  No      Pain Assessment   Currently in Pain?  No/denies    Pain Score  0-No pain    Multiple Pain Sites  No       Capillary Blood Glucose: No results found for this or any previous visit (from the past 24 hour(s)).    Social History   Tobacco Use  Smoking Status Never Smoker  Smokeless Tobacco Never Used    Goals Met:  Independence with exercise equipment Exercise tolerated well No report of cardiac concerns or symptoms Strength training completed today  Goals Unmet:  Not Applicable  Comments: Check out 915   Dr. Kate Sable is Medical Director for [place] and Pulmonary Rehab.

## 2017-09-05 ENCOUNTER — Encounter (HOSPITAL_COMMUNITY)
Admission: RE | Admit: 2017-09-05 | Discharge: 2017-09-05 | Disposition: A | Discharge status: Home / Self Care | Payer: Commercial Managed Care - PPO | Source: Ambulatory Visit | Attending: Cardiology | Admitting: Cardiology

## 2017-09-05 DIAGNOSIS — Z48812 Encounter for surgical aftercare following surgery on the circulatory system: Secondary | ICD-10-CM | POA: Diagnosis not present

## 2017-09-05 DIAGNOSIS — Z951 Presence of aortocoronary bypass graft: Secondary | ICD-10-CM

## 2017-09-05 NOTE — Progress Notes (Signed)
Daily Session Note  Patient Details  Name: Kelly Hardy MRN: [id number] Date of Birth: 12/18/1960 Referring Provider:     [place] from 06/15/2017 in [place]  Referring Provider  DR. Hochrein      Encounter Date: 09/05/2017  Check In: Session Check In - 09/05/17 0815      Check-In   Location  [facility]    Staff Present  Aundra Dubin, RN, BSN;Diane Coad, MS, EP, [facility], Exercise Physiologist    Supervising physician immediately available to respond to emergencies  See telemetry face sheet for immediately available MD    Medication changes reported      No    Fall or balance concerns reported     No    Warm-up and Cool-down  Performed as group-led instruction    Resistance Training Performed  Yes    VAD Patient?  No      Pain Assessment   Currently in Pain?  No/denies    Pain Score  0-No pain    Multiple Pain Sites  No       Capillary Blood Glucose: No results found for this or any previous visit (from the past 24 hour(s)).    Social History   Tobacco Use  Smoking Status Never Smoker  Smokeless Tobacco Never Used    Goals Met:  Independence with exercise equipment Exercise tolerated well No report of cardiac concerns or symptoms Strength training completed today  Goals Unmet:  Not Applicable  Comments: Check out 915.   Dr. Kate Sable is Medical Director for [facility] Cardiac and Pulmonary Rehab.

## 2017-09-07 ENCOUNTER — Encounter (HOSPITAL_COMMUNITY)
Admission: RE | Admit: 2017-09-07 | Discharge: 2017-09-07 | Disposition: A | Discharge status: Home / Self Care | Payer: Commercial Managed Care - PPO | Source: Ambulatory Visit | Attending: Cardiology | Admitting: Cardiology

## 2017-09-07 DIAGNOSIS — Z48812 Encounter for surgical aftercare following surgery on the circulatory system: Secondary | ICD-10-CM | POA: Diagnosis not present

## 2017-09-07 DIAGNOSIS — Z951 Presence of aortocoronary bypass graft: Secondary | ICD-10-CM

## 2017-09-07 NOTE — Progress Notes (Signed)
Daily Session Note  Patient Details  Name: Kelly Hardy MRN: [id number] Date of Birth: 1960-08-20 Referring Provider:     [place] from 06/15/2017 in [place]  Referring Provider  DR. Hochrein      Encounter Date: 09/07/2017  Check In: Session Check In - 09/07/17 0815      Check-In   Location  [facility]    Staff Present  Aundra Dubin, RN, BSN;Diane Coad, MS, EP, [facility], Exercise Physiologist    Supervising physician immediately available to respond to emergencies  See telemetry face sheet for immediately available MD    Medication changes reported      No    Fall or balance concerns reported     No    Warm-up and Cool-down  Performed as group-led instruction    Resistance Training Performed  Yes    VAD Patient?  No      Pain Assessment   Currently in Pain?  No/denies    Pain Score  0-No pain    Multiple Pain Sites  No       Capillary Blood Glucose: No results found for this or any previous visit (from the past 24 hour(s)).    Social History   Tobacco Use  Smoking Status Never Smoker  Smokeless Tobacco Never Used    Goals Met:  Independence with exercise equipment Exercise tolerated well No report of cardiac concerns or symptoms Strength training completed today  Goals Unmet:  Not Applicable  Comments: Check out 915.   Dr. Kate Sable is Medical Director for [facility] Cardiac and Pulmonary Rehab.

## 2017-09-10 ENCOUNTER — Encounter (HOSPITAL_COMMUNITY)
Admission: RE | Admit: 2017-09-10 | Discharge: 2017-09-10 | Disposition: A | Discharge status: Home / Self Care | Payer: Commercial Managed Care - PPO | Source: Ambulatory Visit | Attending: Cardiology | Admitting: Cardiology

## 2017-09-10 DIAGNOSIS — Z48812 Encounter for surgical aftercare following surgery on the circulatory system: Secondary | ICD-10-CM | POA: Diagnosis not present

## 2017-09-10 DIAGNOSIS — Z951 Presence of aortocoronary bypass graft: Secondary | ICD-10-CM

## 2017-09-10 NOTE — Progress Notes (Signed)
Daily Session Note  Patient Details  Name: DONYE CAMPANELLI MRN: [id number] Date of Birth: 30-Jan-1961 Referring Provider:     [place] from 06/15/2017 in [place]  Referring Provider  DR. Hochrein      Encounter Date: 09/10/2017  Check In: Session Check In - 09/10/17 0902      Check-In   Location  [facility]    Staff Present  Aundra Dubin, RN, BSN;Diane Coad, MS, EP, [facility], Exercise Physiologist;Marquis Diles Luther Parody, BS, EP, Exercise Physiologist    Supervising physician immediately available to respond to emergencies  See telemetry face sheet for immediately available MD    Medication changes reported      No    Fall or balance concerns reported     No    Warm-up and Cool-down  Performed as group-led instruction    Resistance Training Performed  Yes    VAD Patient?  No      Pain Assessment   Currently in Pain?  No/denies    Pain Score  0-No pain    Multiple Pain Sites  No       Capillary Blood Glucose: No results found for this or any previous visit (from the past 24 hour(s)).    Social History   Tobacco Use  Smoking Status Never Smoker  Smokeless Tobacco Never Used    Goals Met:  Independence with exercise equipment Exercise tolerated well No report of cardiac concerns or symptoms Strength training completed today  Goals Unmet:  Not Applicable  Comments: Check out 915   Dr. Kate Sable is Medical Director for [place] and Pulmonary Rehab.

## 2017-09-11 ENCOUNTER — Other Ambulatory Visit: Payer: Self-pay | Admitting: Cardiology

## 2017-09-12 ENCOUNTER — Encounter (HOSPITAL_COMMUNITY)
Admission: RE | Admit: 2017-09-12 | Discharge: 2017-09-12 | Disposition: A | Discharge status: Home / Self Care | Payer: Commercial Managed Care - PPO | Source: Ambulatory Visit | Attending: Cardiology | Admitting: Cardiology

## 2017-09-12 VITALS — Ht 69.0 in | Wt 153.3 lb

## 2017-09-12 DIAGNOSIS — Z951 Presence of aortocoronary bypass graft: Secondary | ICD-10-CM

## 2017-09-12 DIAGNOSIS — Z48812 Encounter for surgical aftercare following surgery on the circulatory system: Secondary | ICD-10-CM | POA: Diagnosis not present

## 2017-09-12 NOTE — Progress Notes (Signed)
Daily Session Note  Patient Details  Name: SOTERO BRINKMEYER MRN: [id number] Date of Birth: 17-Oct-1960 Referring Provider:     [place] from 06/15/2017 in [place]  Referring Provider  DR. Hochrein      Encounter Date: 09/12/2017  Check In: Session Check In - 09/12/17 0815      Check-In   Location  [facility]    Staff Present  Aundra Dubin, RN, BSN;Diane Coad, MS, EP, [facility], Exercise Physiologist;Gregory Luther Parody, BS, EP, Exercise Physiologist    Supervising physician immediately available to respond to emergencies  See telemetry face sheet for immediately available MD    Medication changes reported      No    Fall or balance concerns reported     No    Warm-up and Cool-down  Performed as group-led instruction    Resistance Training Performed  Yes    VAD Patient?  No      Pain Assessment   Currently in Pain?  No/denies    Pain Score  0-No pain    Multiple Pain Sites  No       Capillary Blood Glucose: No results found for this or any previous visit (from the past 24 hour(s)).    Social History   Tobacco Use  Smoking Status Never Smoker  Smokeless Tobacco Never Used    Goals Met:  Independence with exercise equipment Exercise tolerated well No report of cardiac concerns or symptoms Strength training completed today  Goals Unmet:  Not Applicable  Comments: Check out 915.   Dr. Kate Sable is Medical Director for [facility] Cardiac and Pulmonary Rehab.

## 2017-09-17 NOTE — Progress Notes (Signed)
Cardiac Individual Treatment Plan  Patient Details  Name: Kelly Hardy MRN: [id number] Date of Birth: Dec 31, 1960 Referring Provider:     CARDIAC REHAB PHASE II ORIENTATION from 06/15/2017 in [place]  Referring Provider  DR. Hochrein      Initial Encounter Date:    CARDIAC REHAB PHASE II ORIENTATION from 06/15/2017 in [place]  Date  06/15/17  Referring Provider  DR. Hochrein      Visit Diagnosis: S/P CABG x 1  Patient's Home Medications on Admission:  Current Outpatient Medications:  .  acetaminophen (TYLENOL) 500 MG tablet, Take 2 tablets (1,000 mg total) by mouth every 8 (eight) hours as needed., Disp: 30 tablet, Rfl: 0 .  aspirin EC 81 MG tablet, Take 81 mg by mouth daily., Disp: , Rfl:  .  insulin lispro protamine-lispro (HUMALOG 75/25 MIX) (75-25) 100 UNIT/ML SUSP injection, Inject 12 Units into the skin 2 (two) times daily with a meal., Disp: , Rfl:  .  insulin regular (NOVOLIN R,HUMULIN R) 100 units/mL injection, Inject 2-10 Units into the skin 3 (three) times daily before meals. Per Sliding Scale, Disp: , Rfl:  .  losartan (COZAAR) 25 MG tablet, Take 1 tablet (25 mg total) by mouth daily., Disp: 90 tablet, Rfl: 3 .  metoprolol succinate (TOPROL-XL) 25 MG 24 hr tablet, Take 25 mg by mouth daily., Disp: , Rfl:  .  Multiple Vitamins-Minerals (MULTIVITAMIN WITH MINERALS) tablet, Take 1 tablet by mouth daily., Disp: , Rfl:  .  NOVOFINE 32G X 6 MM MISC, USE TO ADMINSTER INSULIN SIX TIMES DAILY., Disp: , Rfl: 4 .  oxyCODONE (OXY IR/ROXICODONE) 5 MG immediate release tablet, Take 1 tablet (5 mg total) by mouth every 4 (four) hours as needed for severe pain., Disp: 30 tablet, Rfl: 0 .  rosuvastatin (CRESTOR) 40 MG tablet, TAKE ONE TABLET BY MOUTH AT BEDTIME., Disp: 30 tablet, Rfl: 10  Past Medical History: Past Medical History:  Diagnosis Date  . Coronary artery disease   . Diabetes mellitus without complication (HCC)    Type I   . GERD (gastroesophageal reflux disease)   . Hyperlipidemia   . Hypertension   . Palpitations    eval 2014    Tobacco Use: Social History   Tobacco Use  Smoking Status Never Smoker  Smokeless Tobacco Never Used    Labs: Recent Review Flowsheet Data    Labs for ITP Cardiac and Pulmonary Rehab Latest Ref Rng & Units 04/04/2017 04/04/2017 04/04/2017 04/04/2017 04/05/2017   Hemoglobin A1c 4.8 - 5.6 % - - - - -   PHART 7.350 - 7.450 7.405 - 7.277(L) - -   PCO2ART 32.0 - 48.0 mmHg 31.5(L) - 46.3 - -   HCO3 20.0 - 28.0 mmol/L 19.8(L) - 21.7 - -   TCO2 22 - 32 mmol/L 21(L) '22 23 23 26   '$ ACIDBASEDEF 0.0 - 2.0 mmol/L 4.0(H) - 5.0(H) - -   O2SAT % 100.0 - 100.0 - -      Capillary Blood Glucose: Lab Results  Component Value Date   GLUCAP 177 (H) 04/08/2017   GLUCAP 226 (H) 04/07/2017   GLUCAP 161 (H) 04/07/2017   GLUCAP 202 (H) 04/07/2017   GLUCAP 247 (H) 04/07/2017     Exercise Target Goals:    Exercise Program Goal: Individual exercise prescription set using results from initial 6 min walk test and THRR while considering  patient's activity barriers and safety.   Exercise Prescription Goal: Starting with aerobic activity 30 plus  minutes a day, 3 days per week for initial exercise prescription. Provide home exercise prescription and guidelines that participant acknowledges understanding prior to discharge.  Activity Barriers & Risk Stratification: Activity Barriers & Cardiac Risk Stratification - 06/15/17 1343      Activity Barriers & Cardiac Risk Stratification   Activity Barriers  None    Cardiac Risk Stratification  High       6 Minute Walk: 6 Minute Walk    Row Name 06/15/17 1342 09/12/17 1421       6 Minute Walk   Phase  Initial  Discharge    Distance  1550 feet  1800 feet    Distance % Change  0 %  16.13 %    Distance Feet Change  0 ft  250 ft    Walk Time  6 minutes  6 minutes    # of Rest Breaks  0  0    MPH  2.93  3.4    METS  3.25  3.61    RPE  9  12     Perceived Dyspnea   9  9    VO2 Peak  17.19  18.16    Symptoms  No  No    Resting HR  109 bpm  82 bpm    Resting BP  112/60  120/50    Resting Oxygen Saturation   98 %  95 %    Exercise Oxygen Saturation  during 6 min walk  98 %  96 %    Max Ex. HR  121 bpm  118 bpm    Max Ex. BP  162/70  154/62    2 Minute Post BP  120/62  122/64       Oxygen Initial Assessment:   Oxygen Re-Evaluation:   Oxygen Discharge (Final Oxygen Re-Evaluation):   Initial Exercise Prescription: Initial Exercise Prescription - 06/15/17 1300      Date of Initial Exercise RX and Referring Provider   Date  06/15/17    Referring Provider  DR. Hochrein      Treadmill   MPH  2    Grade  0    Minutes  15    METs  2.5      Recumbant Elliptical   Level  1    RPM  30    Watts  34    Minutes  20    METs  2      Prescription Details   Frequency (times per week)  3    Duration  Progress to 30 minutes of continuous aerobic without signs/symptoms of physical distress      Intensity   THRR 40-80% of Max Heartrate  [phone]    Ratings of Perceived Exertion  11-13    Perceived Dyspnea  0-4      Progression   Progression  Continue progressive overload as per policy without signs/symptoms or physical distress.      Resistance Training   Training Prescription  Yes    Weight  1    Reps  10-15       Perform Capillary Blood Glucose checks as needed.  Exercise Prescription Changes:  Exercise Prescription Changes    Row Name 06/19/17 1500 06/28/17 1500 07/23/17 1400 08/07/17 0800 08/17/17 1500     Response to Exercise   Blood Pressure (Admit)  128/50  120/50  110/56  118/54  110/56   Blood Pressure (Exercise)  140/60  150/60  150/60  150/60  150/62   Blood Pressure (  Exit)  100/50  100/50  100/50  106/50  100/50   Heart Rate (Admit)  87 bpm  64 bpm  90 bpm  69 bpm  73 bpm   Heart Rate (Exercise)  109 bpm  106 bpm  116 bpm  115 bpm  109 bpm   Heart Rate (Exit)  104 bpm  74 bpm  100 bpm  80 bpm   83 bpm   Rating of Perceived Exertion (Exercise)  '11  12  10  10  10   '$ Duration  Progress to 30 minutes of  aerobic without signs/symptoms of physical distress  Progress to 30 minutes of  aerobic without signs/symptoms of physical distress  Progress to 30 minutes of  aerobic without signs/symptoms of physical distress  Progress to 30 minutes of  aerobic without signs/symptoms of physical distress  Progress to 30 minutes of  aerobic without signs/symptoms of physical distress   Intensity  THRR New [phone]  THRR New [phone]  THRR New 119-134-148  THRR New [phone]  THRR New [phone]     Progression   Progression  Continue to progress workloads to maintain intensity without signs/symptoms of physical distress.  Continue to progress workloads to maintain intensity without signs/symptoms of physical distress.  Continue to progress workloads to maintain intensity without signs/symptoms of physical distress.  Continue to progress workloads to maintain intensity without signs/symptoms of physical distress.  Continue to progress workloads to maintain intensity without signs/symptoms of physical distress.     Resistance Training   Training Prescription  Yes  Yes  Yes  Yes  Yes   Weight  '1  2  2  4  5   '$ Reps  10-15  10-15  10-15  10-15  10-15     Treadmill   MPH  2  2.4  2.7  3  3.1   Grade  0  0  0  0  0   Minutes  '20  20  20  20  20   '$ METs  2.5  2.83  3.06  3.2  3.3     Recumbant Elliptical   Level  '1  1  2  2  2   '$ RPM  37  48  62  64  65   Watts  44  57  91  95  93   Minutes  '15  15  15  15  15   '$ METs  2.6  3.1  5.1  5.2  5     Home Exercise Plan   Plans to continue exercise at  Home (comment)  Home (comment)  Home (comment)  Home (comment)  Home (comment)   Frequency  Add 2 additional days to program exercise sessions.  Add 2 additional days to program exercise sessions.  Add 2 additional days to program exercise sessions.  Add 2 additional days to program exercise sessions.  Add 2  additional days to program exercise sessions.   Initial Home Exercises Provided  06/19/17  06/19/17  06/19/17  06/19/17  06/19/17   Row Name 08/31/17 1400             Response to Exercise   Blood Pressure (Admit)  110/50       Blood Pressure (Exercise)  140/60       Blood Pressure (Exit)  100/50       Heart Rate (Admit)  75 bpm       Heart Rate (Exercise)  110 bpm  Heart Rate (Exit)  85 bpm       Rating of Perceived Exertion (Exercise)  10       Duration  Progress to 30 minutes of  aerobic without signs/symptoms of physical distress       Intensity  THRR New [phone]         Progression   Progression  Continue to progress workloads to maintain intensity without signs/symptoms of physical distress.         Resistance Training   Training Prescription  Yes       Weight  5       Reps  10-15         Treadmill   MPH  3.1       Grade  0.5       Minutes  20       METs  3.56         Recumbant Elliptical   Level  2       RPM  76       Watts  93       Minutes  15       METs  5.2         Home Exercise Plan   Plans to continue exercise at  Home (comment)       Frequency  Add 2 additional days to program exercise sessions.       Initial Home Exercises Provided  06/19/17          Exercise Comments:  Exercise Comments    Row Name 06/19/17 1510 06/28/17 1523 07/23/17 1431 08/07/17 0805 08/17/17 1511   Exercise Comments  Patient has just started CR and will be progressed in time.   Patient is doing well in Cr and has progressed his speed on the treadmill machine and has also maintained his level and SPMs on teh recumbent elliptical. Patient has still only just started the program and will be progressed more in time by the staff here at [facility].   Patient continues to do well in CR. Patient has increased his speed on the treadmill to 2.68mh and has also been able to increase his level as well as watts and METs on the recumbent elliptical. Patient has stated to me that he feels that  he has regained some stamina since beginning the program. Patient will continued to be monitored throughout the remainder of the program.   Patient is doing well in CR. Patient has increased his speed on the treadmill to 3.0 and has maintained his level on the recumbent elliptical. Patient has stated tha he has more stamina since being on the machines than as opposed to before. Patient has more energy for work and other activities when he is not here in [place].   Patient continues to do well in CR. Patient has increased his dumbell size to 5lbs to increase his strength even further and he has increased his speed on the treadmill to 3.1. Patient has also maintained his level and SPMs on the recumbent elliptical. 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 patient has increased his incline on the treadmill to 0.5. Patient has maintained his speed and level on the recumbent elliptical doing very well on both machines and  handling the 5lbs dumbbells as well.           Exercise Goals and Review:  Exercise Goals    Row Name 06/15/17 1344             Exercise Goals   Increase Physical Activity  Yes       Intervention  Provide advice, education, support and counseling about physical activity/exercise needs.;Develop an individualized exercise prescription for aerobic and resistive training based on initial evaluation findings, risk stratification, comorbidities and participant's personal goals.       Expected Outcomes  Short Term: Attend rehab on a regular basis to increase amount of physical activity.       Increase Strength and Stamina  Yes       Intervention  Provide advice, education, support and counseling about physical activity/exercise needs.;Develop an individualized exercise prescription for aerobic and  resistive training based on initial evaluation findings, risk stratification, comorbidities and participant's personal goals.       Expected Outcomes  Short Term: Increase workloads from initial exercise prescription for resistance, speed, and METs.       Able to understand and use rate of perceived exertion (RPE) scale  Yes       Intervention  Provide education and explanation on how to use RPE scale       Expected Outcomes  Short Term: Able to use RPE daily in rehab to express subjective intensity level;Long Term:  Able to use RPE to guide intensity level when exercising independently       Able to understand and use Dyspnea scale  Yes       Intervention  Provide education and explanation on how to use Dyspnea scale       Expected Outcomes  Long Term: Able to use Dyspnea scale to guide intensity level when exercising independently;Short Term: Able to use Dyspnea scale daily in rehab to express subjective sense of shortness of breath during exertion       Knowledge and understanding of Target Heart Rate Range (THRR)  Yes       Intervention  Provide education and explanation of THRR including how the numbers were predicted and where they are located for reference       Expected Outcomes  Short Term: Able to state/look up THRR;Long Term: Able to use THRR to govern intensity when exercising independently;Short Term: Able to use daily as guideline for intensity in rehab       Able to check pulse independently  Yes       Intervention  Provide education and demonstration on how to check pulse in carotid and radial arteries.;Review the importance of being able to check your own pulse for safety during independent exercise       Expected Outcomes  Short Term: Able to explain why pulse checking is important during independent exercise;Long Term: Able to check pulse independently and accurately       Understanding of Exercise Prescription  Yes       Intervention  Provide education, explanation, and written  materials on patient's individual exercise prescription       Expected Outcomes  Short Term: Able to explain program exercise prescription;Long Term: Able to explain home exercise prescription to exercise independently          Exercise Goals Re-Evaluation : Exercise Goals Re-Evaluation    Row Name 06/28/17 1521 07/23/17 1430 08/17/17 1510 09/11/17 0748       Exercise Goal Re-Evaluation   Exercise Goals Review  Increase Physical Activity;Increase Strength and Stamina;Able to understand and use rate of perceived exertion (RPE) scale;Able to check pulse independently;Knowledge and understanding of Target Heart Rate Range (THRR);Able to understand and use Dyspnea scale;Understanding of Exercise Prescription  Increase Physical Activity;Increase Strength and Stamina;Able to understand and use rate of perceived exertion (RPE) scale;Able to check pulse independently;Knowledge and understanding of Target Heart Rate Range (THRR);Able to understand and use Dyspnea scale;Understanding of Exercise Prescription  Increase Physical Activity;Increase Strength and Stamina;Able to understand and use rate of perceived exertion (RPE) scale;Able to check pulse independently;Knowledge and understanding of Target Heart Rate Range (THRR);Able to understand and use Dyspnea scale;Understanding of Exercise Prescription  Increase Physical Activity;Increase Strength and Stamina;Able to understand and use rate of perceived exertion (RPE) scale;Able to check pulse independently;Knowledge and understanding of Target Heart Rate Range (THRR);Able to understand and use Dyspnea scale;Understanding of Exercise Prescription    Comments  Patient is doing well in Cr and has progressed his speed on the treadmill machine and has also maintained his level and SPMs on teh recumbent elliptical. Patient has still only just started the program and will be progressed more in time by the staff here at [facility].   Patient continues to do well in [facility]. Patient  has increased his speed on the treadmill to 2.41mh and has also been able to increase his level as well as watts and METs on the recumbent elliptical. Patient has stated to me that he feels that he has regained some stamina since beginning the program. Patient will continued to be monitored throughout the remainder of the program.   Patient continues to do well in CR. Patient has increased his dumbell size to 5lbs to increase his strength even further and he has increased his speed on the treadmill to 3.1. Patient has also maintained his level and SPMs on the recumbent elliptical. 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 Patient states that he has much more strength and stamina and gets his walking in at work when he is not here at [facility]. Patient states that he would like to know more about strength training and the final take home packet that we give out will help him with this     Expected Outcomes  Patient wishes to regain stamina and to get more education about nutrition and exercise.   Patient wishes to regain stamina and to get more education about nutrition and exercise.   Patient wishes to regain stamina and to get more education about nutrition and exercise.   Patient wishes to regain stamina and to get more education about nutrition and exercise.         Discharge Exercise Prescription (Final Exercise Prescription Changes): Exercise Prescription Changes - 08/31/17 1400      Response to Exercise   Blood Pressure (Admit)  110/50    Blood Pressure (Exercise)  140/60    Blood Pressure (Exit)  100/50    Heart Rate (Admit)  75 bpm    Heart Rate (Exercise)  110 bpm    Heart Rate (Exit)  85 bpm    Rating of Perceived Exertion (Exercise)  10    Duration  Progress to 30 minutes of  aerobic without  signs/symptoms of physical distress    Intensity  THRR New [phone]  Progression   Progression  Continue to progress workloads to maintain intensity without signs/symptoms of physical distress.      Resistance Training   Training Prescription  Yes    Weight  5    Reps  10-15      Treadmill   MPH  3.1    Grade  0.5    Minutes  20    METs  3.56      Recumbant Elliptical   Level  2    RPM  76    Watts  93    Minutes  15    METs  5.2      Home Exercise Plan   Plans to continue exercise at  Home (comment)    Frequency  Add 2 additional days to program exercise sessions.    Initial Home Exercises Provided  06/19/17       Nutrition:  Target Goals: Understanding of nutrition guidelines, daily intake of sodium '1500mg'$ , cholesterol '200mg'$ , calories 30% from fat and 7% or less from saturated fats, daily to have 5 or more servings of fruits and vegetables.  Biometrics: Pre Biometrics - 06/15/17 1345      Pre Biometrics   Height  '5\' 9"'$  (1.753 m)    Weight  147 lb (66.7 kg)    Waist Circumference  29 inches    Hip Circumference  33 inches    Waist to Hip Ratio  0.88 %    BMI (Calculated)  21.7    Triceps Skinfold  10 mm    % Body Fat  18.1 %    Grip Strength  64.06 kg    Flexibility  13.66 in    Single Leg Stand  60 seconds      Post Biometrics - 09/12/17 1421       Post  Biometrics   Height  '5\' 9"'$  (1.753 m)    Weight  153 lb 4.9 oz (69.5 kg)    Waist Circumference  29 inches    Hip Circumference  32 inches    Waist to Hip Ratio  0.91 %    BMI (Calculated)  22.63    Triceps Skinfold  11 mm    % Body Fat  18.8 %    Grip Strength  84.5 kg    Flexibility  12 in    Single Leg Stand  60 seconds       Nutrition Therapy Plan and Nutrition Goals: Nutrition Therapy & Goals - 08/21/17 1132      Nutrition Therapy   RD appointment deferred  Yes      Personal Nutrition Goals   Personal Goal #2  Patient continues to say he eats heart healthy.    Additional  Goals?  No       Nutrition Assessments: Nutrition Assessments - 09/17/17 1436      MEDFICTS Scores   Pre Score  33    Post Score  3    Score Difference  -30       Nutrition Goals Re-Evaluation:   Nutrition Goals Discharge (Final Nutrition Goals Re-Evaluation):   Psychosocial: Target Goals: Acknowledge presence or absence of significant depression and/or stress, maximize coping skills, provide positive support system. Participant is able to verbalize types and ability to use techniques and skills needed for reducing stress and depression.  Initial Review & Psychosocial Screening: Initial Psych Review & Screening - 06/15/17 1509      Initial Review   Current issues with  None Identified  Family Dynamics   Good Support System?  Yes      Barriers   Psychosocial barriers to participate in program  There are no identifiable barriers or psychosocial needs.      Screening Interventions   Interventions  Encouraged to exercise    Expected Outcomes  Short Term goal: Identification and review with participant of any Quality of Life or Depression concerns found by scoring the questionnaire.;Long Term goal: The participant improves quality of Life and PHQ9 Scores as seen by post scores and/or verbalization of changes       Quality of Life Scores: Quality of Life - 09/12/17 1422      Quality of Life Scores   Health/Function Pre  23.83 %    Health/Function Post  26.7 %    Health/Function % Change  12.04 %    Socioeconomic Pre  28.07 %    Socioeconomic Post  28.29 %    Socioeconomic % Change   0.78 %    Psych/Spiritual Pre  27.86 %    Psych/Spiritual Post  24.86 %    Psych/Spiritual % Change  -10.77 %    Family Pre  25.2 %    Family Post  30 %    Family % Change  19.05 %    GLOBAL Pre  25.74 %    GLOBAL Post  27.13 %    GLOBAL % Change  5.4 %      Scores of 19 and below usually indicate a poorer quality of life in these areas.  A difference of  2-3 points is a  clinically meaningful difference.  A difference of 2-3 points in the total score of the Quality of Life Index has been associated with significant improvement in overall quality of life, self-image, physical symptoms, and general health in studies assessing change in quality of life.  PHQ-9: Recent Review Flowsheet Data    Depression screen [facility] 2/9 09/17/2017 06/15/2017   Decreased Interest 0 0   Down, Depressed, Hopeless 0 0   PHQ - 2 Score 0 0   Altered sleeping 0 0   Tired, decreased energy 0 1   Change in appetite 0 0   Feeling bad or failure about yourself  0 0   Trouble concentrating 0 0   Moving slowly or fidgety/restless 0 0   Suicidal thoughts 0 0   PHQ-9 Score 0 1   Difficult doing work/chores Not difficult at all Not difficult at all     Interpretation of Total Score  Total Score Depression Severity:  1-4 = Minimal depression, 5-9 = Mild depression, 10-14 = Moderate depression, 15-19 = Moderately severe depression, 20-27 = Severe depression   Psychosocial Evaluation and Intervention: Psychosocial Evaluation - 09/17/17 1442      Discharge Psychosocial Assessment & Intervention   Comments  Patient has no psychosocial issues identified at discharge. His exit QOL score improved by 5.4% at 27.13 and his exit PHQ-9 score was 0.        Psychosocial Re-Evaluation: Psychosocial Re-Evaluation    [place] Name 06/15/17 1511 07/04/17 1339 08/01/17 1339 08/21/17 1137       Psychosocial Re-Evaluation   Current issues with  None Identified  None Identified  None Identified  None Identified    Comments  -  Patient's initial QOL score was 25.74 and his PHQ-9 score was 1 with no psychosocial issues identified.   Patient's initial QOL score was 25.74 and his PHQ-9 score was 1 with no psychosocial issues identified.  Patient's initial QOL score was 25.74 and his PHQ-9 score was 1 with no psychosocial issues identified.     Expected Outcomes  -  Patient will have no psychosocial issues  identified at discharge.   Patient will have no psychosocial issues identified at discharge.   Patient will have no psychosocial issues identified at discharge.     Interventions  -  Stress management education;Encouraged to attend Cardiac Rehabilitation for the exercise;Relaxation education  Stress management education;Encouraged to attend Cardiac Rehabilitation for the exercise;Relaxation education  Stress management education;Encouraged to attend Cardiac Rehabilitation for the exercise;Relaxation education    Continue Psychosocial Services   No Follow up required  No Follow up required  No Follow up required  No Follow up required       Psychosocial Discharge (Final Psychosocial Re-Evaluation): Psychosocial Re-Evaluation - 08/21/17 1137      Psychosocial Re-Evaluation   Current issues with  None Identified    Comments  Patient's initial QOL score was 25.74 and his PHQ-9 score was 1 with no psychosocial issues identified.     Expected Outcomes  Patient will have no psychosocial issues identified at discharge.     Interventions  Stress management education;Encouraged to attend Cardiac Rehabilitation for the exercise;Relaxation education    Continue Psychosocial Services   No Follow up required       Vocational Rehabilitation: Provide vocational rehab assistance to qualifying candidates.   Vocational Rehab Evaluation & Intervention: Vocational Rehab - 06/15/17 1459      Initial Vocational Rehab Evaluation & Intervention   Assessment shows need for Vocational Rehabilitation  No       Education: Education Goals: Education classes will be provided on a weekly basis, covering required topics. Participant will state understanding/return demonstration of topics presented.  Learning Barriers/Preferences: Learning Barriers/Preferences - 06/15/17 1458      Learning Barriers/Preferences   Learning Barriers  None    Learning Preferences  Audio;Computer/Internet;Group Instruction;Individual  Instruction;Pictoral;Skilled Demonstration;Verbal Instruction;Video;Written Material       Education Topics: Hypertension, Hypertension Reduction -Define heart disease and high blood pressure. Discus how high blood pressure affects the body and ways to reduce high blood pressure.   CARDIAC REHAB PHASE II EXERCISE from 09/12/2017 in [place]  Date  08/08/17  Educator  Russella Dar  Instruction Review Code  2- Demonstrated Understanding      Exercise and Your Heart -Discuss why it is important to exercise, the FITT principles of exercise, normal and abnormal responses to exercise, and how to exercise safely.   CARDIAC REHAB PHASE II EXERCISE from 09/12/2017 in [place]  Date  08/15/17  Educator  [place]  Instruction Review Code  2- Demonstrated Understanding      Angina -Discuss definition of angina, causes of angina, treatment of angina, and how to decrease risk of having angina.   CARDIAC REHAB PHASE II EXERCISE from 09/12/2017 in [place]  Date  08/22/17  Educator  DC  Instruction Review Code  2- Demonstrated Understanding      Cardiac Medications -Review what the following cardiac medications are used for, how they affect the body, and side effects that may occur when taking the medications.  Medications include Aspirin, Beta blockers, calcium channel blockers, ACE Inhibitors, angiotensin receptor blockers, diuretics, digoxin, and antihyperlipidemics.   CARDIAC REHAB PHASE II EXERCISE from 09/12/2017 in [place]  Date  08/29/17  Educator  DC  Instruction Review Code  2- Demonstrated Understanding  Congestive Heart Failure -Discuss the definition of CHF, how to live with CHF, the signs and symptoms of CHF, and how keep track of weight and sodium intake.   CARDIAC REHAB PHASE II EXERCISE from 09/12/2017 in [place]  Date  09/05/17  Educator  DC   Instruction Review Code  2- Demonstrated Understanding      Heart Disease and Intimacy -Discus the effect sexual activity has on the heart, how changes occur during intimacy as we age, and safety during sexual activity.   CARDIAC REHAB PHASE II EXERCISE from 09/12/2017 in [place]  Date  09/12/17  Educator  DJ  Instruction Review Code  2- Demonstrated Understanding      Smoking Cessation / COPD -Discuss different methods to quit smoking, the health benefits of quitting smoking, and the definition of COPD.   CARDIAC REHAB PHASE II EXERCISE from 09/12/2017 in [place]  Date  06/21/17  Educator  DC  Instruction Review Code  2- Demonstrated Understanding      Nutrition I: Fats -Discuss the types of cholesterol, what cholesterol does to the heart, and how cholesterol levels can be controlled.   CARDIAC REHAB PHASE II EXERCISE from 09/12/2017 in [place]  Date  06/28/17  Educator  DC  Instruction Review Code  2- Demonstrated Understanding      Nutrition II: Labels -Discuss the different components of food labels and how to read food label   CARDIAC REHAB PHASE II EXERCISE from 09/12/2017 in [place]  Date  07/04/17  Educator  DJ  Instruction Review Code  2- Demonstrated Understanding      Heart Parts/Heart Disease and PAD -Discuss the anatomy of the heart, the pathway of blood circulation through the heart, and these are affected by heart disease.   CARDIAC REHAB PHASE II EXERCISE from 09/12/2017 in [place]  Date  07/11/17  Educator  DJ  Instruction Review Code  2- Demonstrated Understanding      Stress I: Signs and Symptoms -Discuss the causes of stress, how stress may lead to anxiety and depression, and ways to limit stress.   Stress II: Relaxation -Discuss different types of relaxation techniques to limit stress.   CARDIAC REHAB PHASE II  EXERCISE from 09/12/2017 in [place]  Date  07/25/17  Educator  DJ  Instruction Review Code  2- Demonstrated Understanding      Warning Signs of Stroke / TIA -Discuss definition of a stroke, what the signs and symptoms are of a stroke, and how to identify when someone is having stroke.   CARDIAC REHAB PHASE II EXERCISE from 09/12/2017 in [place]  Date  08/01/17  Educator  DC  Instruction Review Code  2- Demonstrated Understanding      Knowledge Questionnaire Score: Knowledge Questionnaire Score - 09/17/17 1435      Knowledge Questionnaire Score   Pre Score  21/24    Post Score  22/24       Core Components/Risk Factors/Patient Goals at Admission: Personal Goals and Risk Factors at Admission - 06/15/17 1501      Core Components/Risk Factors/Patient Goals on Admission    Weight Management  Weight Maintenance    Diabetes  Yes    Expected Outcomes  Long Term: Attainment of HbA1C < 7%.;Short Term: Participant verbalizes understanding of the signs/symptoms and immediate care of hyper/hypoglycemia, proper foot care and importance of medication, aerobic/resistive exercise and nutrition plan  for blood glucose control.    Personal Goal Other  Yes    Personal Goal  Regain Stamina, More education about exercise, nutrition    Intervention  Attend 3 x week and supplement home exercise 2 x week.    Expected Outcomes  Reach goals       Core Components/Risk Factors/Patient Goals Review:  Goals and Risk Factor Review    Row Name 07/04/17 1336 08/01/17 1336 08/21/17 1132 09/17/17 1437       Core Components/Risk Factors/Patient Goals Review   Personal Goals Review  Weight Management/Obesity Regain stamina; more education about exercise/nutrition.   Weight Management/Obesity Regain stamina; more education about exercise/nutrition.  Weight Management/Obesity Regain stamina; more education about exercise/nutrition.  Weight Management/Obesity  Regain strength; more education about exercise and nutrition.    Review  Patient has completed 9 sessions gaining 1 lb since he started the program. His reported fasting glucose readings have been labile. His last A1C was 04/02/17 at 8.5. He says he feels his flexiblity has improved since he started. He has returned to work. Will continue to monitor for progress.   Patient has completed 19 sessions gaining 2.3 lbs since he started the program. His reported fasting glucose readings continue to fluctuate. No recent A1C avaliable. He is doing well in the program with progressions. He says he is feeling stronger and is able to work without difficulty. 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 He says he does not get as tired after working all day. Will continue to monitor for progress.   Patient has completed 27 sessions gaining 1 lb since last 30 day review. He continues to do well in the program. His reported glucose readings continue to fluctuate. He reports his last A1C was in January at 6. He is scheduled to have another one in June. Patient continues to work fulltime without getting fatigued. 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 He says he is not having any problems doing this. He says he has more stamina and energy and feels the program has helped him a lot. Will continue to monitor for progress.   Patient graduated with 36 sessions gaining 6.2 lbs overall. He did well in the program with better DM control. He is scheduled to have a A1C later this month. He says he feels much better overall with a lot more energy and stamina and improved strength. His exit measurements improved in grip strength and his walk test improved by 16.13%. He is working full time without difficulty. Him and his wife plan to exercise together maybe at the [facility].    Expected Outcomes  Patient will continue to attend sessions and complete  the program meeting his personal goals.   Patient will continue to attend sessions and complete the program meeting his personal goals.   Patient will continue to attend sessions and complete the program meeting his personal goals.   Patient will continue exercising and continue to meet his personal goals.        Core Components/Risk Factors/Patient Goals at Discharge (Final Review):  Goals and Risk Factor Review - 09/17/17 1437      Core Components/Risk Factors/Patient Goals Review   Personal Goals Review  Weight Management/Obesity Regain strength; more education about exercise and nutrition.    Review  Patient graduated with 36 sessions gaining 6.2 lbs overall. He did well in the program with better DM control.  He is scheduled to have a A1C later this month. He says he feels much better overall with a lot more energy and stamina and improved strength. His exit measurements improved in grip strength and his walk test improved by 16.13%. He is working full time without difficulty. Him and his wife plan to exercise together maybe at the [facility].    Expected Outcomes  Patient will continue exercising and continue to meet his personal goals.        ITP Comments:   Comments: Patient graduated from [facility] today on 09/12/17 after completing 36 sessions. He achieved LTG of 30 minutes of aerobic exercise at Max Met level of 5.33. All patients vitals are WNL. Patient has met with dietician. Discharge instruction has been reviewed in detail and patient stated an understanding of material given. Patient plans to continues exercising with his wife at the [facility]. Cardiac Rehab staff will make f/u calls at 1 month, 6 months, and 1 year. Patient had no complaints of any abnormal S/S or pain on their exit visit.

## 2017-09-17 NOTE — Progress Notes (Signed)
Discharge Progress Report  Patient Details  Name: Kelly Hardy MRN: [id number] Date of Birth: 09-26-1960 Referring Provider:     [place] from 06/15/2017 in [place]  Referring Provider  DR. Hochrein       Number of Visits: 28  Reason for Discharge:  Patient reached a stable level of exercise. Patient independent in their exercise. Patient has met program and personal goals.  Smoking History:  Social History   Tobacco Use  Smoking Status Never Smoker  Smokeless Tobacco Never Used    Diagnosis:  S/P CABG x 1  ADL UCSD:   Initial Exercise Prescription: Initial Exercise Prescription - 06/15/17 1300      Date of Initial Exercise RX and Referring Provider   Date  06/15/17    Referring Provider  DR. Hochrein      Treadmill   MPH  2    Grade  0    Minutes  15    METs  2.5      Recumbant Elliptical   Level  1    RPM  30    Watts  34    Minutes  20    METs  2      Prescription Details   Frequency (times per week)  3    Duration  Progress to 30 minutes of continuous aerobic without signs/symptoms of physical distress      Intensity   THRR 40-80% of Max Heartrate  [phone]    Ratings of Perceived Exertion  11-13    Perceived Dyspnea  0-4      Progression   Progression  Continue progressive overload as per policy without signs/symptoms or physical distress.      Resistance Training   Training Prescription  Yes    Weight  1    Reps  10-15       Discharge Exercise Prescription (Final Exercise Prescription Changes): Exercise Prescription Changes - 08/31/17 1400      Response to Exercise   Blood Pressure (Admit)  110/50    Blood Pressure (Exercise)  140/60    Blood Pressure (Exit)  100/50    Heart Rate (Admit)  75 bpm    Heart Rate (Exercise)  110 bpm    Heart Rate (Exit)  85 bpm    Rating of Perceived Exertion (Exercise)  10    Duration  Progress to 30 minutes of  aerobic without signs/symptoms  of physical distress    Intensity  THRR New [phone]      Progression   Progression  Continue to progress workloads to maintain intensity without signs/symptoms of physical distress.      Resistance Training   Training Prescription  Yes    Weight  5    Reps  10-15      Treadmill   MPH  3.1    Grade  0.5    Minutes  20    METs  3.56      Recumbant Elliptical   Level  2    RPM  76    Watts  93    Minutes  15    METs  5.2      Home Exercise Plan   Plans to continue exercise at  Home (comment)    Frequency  Add 2 additional days to program exercise sessions.    Initial Home Exercises Provided  06/19/17       Functional Capacity: 6 Minute Walk    Row Name 06/15/17  1342 09/12/17 1421       6 Minute Walk   Phase  Initial  Discharge    Distance  1550 feet  1800 feet    Distance % Change  0 %  16.13 %    Distance Feet Change  0 ft  250 ft    Walk Time  6 minutes  6 minutes    # of Rest Breaks  0  0    MPH  2.93  3.4    METS  3.25  3.61    RPE  9  12    Perceived Dyspnea   9  9    VO2 Peak  17.19  18.16    Symptoms  No  No    Resting HR  109 bpm  82 bpm    Resting BP  112/60  120/50    Resting Oxygen Saturation   98 %  95 %    Exercise Oxygen Saturation  during 6 min walk  98 %  96 %    Max Ex. HR  121 bpm  118 bpm    Max Ex. BP  162/70  154/62    2 Minute Post BP  120/62  122/64       Psychological, QOL, Others - Outcomes: PHQ 2/9: Depression screen [facility] 2/9 09/17/2017 06/15/2017  Decreased Interest 0 0  Down, Depressed, Hopeless 0 0  PHQ - 2 Score 0 0  Altered sleeping 0 0  Tired, decreased energy 0 1  Change in appetite 0 0  Feeling bad or failure about yourself  0 0  Trouble concentrating 0 0  Moving slowly or fidgety/restless 0 0  Suicidal thoughts 0 0  PHQ-9 Score 0 1  Difficult doing work/chores Not difficult at all Not difficult at all    Quality of Life: Quality of Life - 09/12/17 1422      Quality of Life Scores   Health/Function Pre   23.83 %    Health/Function Post  26.7 %    Health/Function % Change  12.04 %    Socioeconomic Pre  28.07 %    Socioeconomic Post  28.29 %    Socioeconomic % Change   0.78 %    Psych/Spiritual Pre  27.86 %    Psych/Spiritual Post  24.86 %    Psych/Spiritual % Change  -10.77 %    Family Pre  25.2 %    Family Post  30 %    Family % Change  19.05 %    GLOBAL Pre  25.74 %    GLOBAL Post  27.13 %    GLOBAL % Change  5.4 %       Personal Goals: Goals established at orientation with interventions provided to work toward goal. Personal Goals and Risk Factors at Admission - 06/15/17 1501      Core Components/Risk Factors/Patient Goals on Admission    Weight Management  Weight Maintenance    Diabetes  Yes    Expected Outcomes  Long Term: Attainment of HbA1C < 7%.;Short Term: Participant verbalizes understanding of the signs/symptoms and immediate care of hyper/hypoglycemia, proper foot care and importance of medication, aerobic/resistive exercise and nutrition plan for blood glucose control.    Personal Goal Other  Yes    Personal Goal  Regain Stamina, More education about exercise, nutrition    Intervention  Attend 3 x week and supplement home exercise 2 x week.    Expected Outcomes  Reach goals  Personal Goals Discharge: Goals and Risk Factor Review    Row Name 07/04/17 1336 08/01/17 1336 08/21/17 1132 09/17/17 1437       Core Components/Risk Factors/Patient Goals Review   Personal Goals Review  Weight Management/Obesity Regain stamina; more education about exercise/nutrition.   Weight Management/Obesity Regain stamina; more education about exercise/nutrition.  Weight Management/Obesity Regain stamina; more education about exercise/nutrition.  Weight Management/Obesity Regain strength; more education about exercise and nutrition.    Review  Patient has completed 9 sessions gaining 1 lb since he started the program. His reported fasting glucose readings have been labile. His last  A1C was 04/02/17 at 8.5. He says he feels his flexiblity has improved since he started. He has returned to work. Will continue to monitor for progress.   Patient has completed 19 sessions gaining 2.3 lbs since he started the program. His reported fasting glucose readings continue to fluctuate. No recent A1C avaliable. He is doing well in the program with progressions. He says he is feeling stronger and is able to work without difficulty. 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 He says he does not get as tired after working all day. Will continue to monitor for progress.   Patient has completed 27 sessions gaining 1 lb since last 30 day review. He continues to do well in the program. His reported glucose readings continue to fluctuate. He reports his last A1C was in January at 6. He is scheduled to have another one in June. Patient continues to work fulltime without getting fatigued. 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 He says he is not having any problems doing this. He says he has more stamina and energy and feels the program has helped him a lot. Will continue to monitor for progress.   Patient graduated with 36 sessions gaining 6.2 lbs overall. He did well in the program with better DM control. He is scheduled to have a A1C later this month. He says he feels much better overall with a lot more energy and stamina and improved strength. His exit measurements improved in grip strength and his walk test improved by 16.13%. He is working full time without difficulty. Him and his wife plan to exercise together maybe at the [facility].    Expected Outcomes  Patient will continue to attend sessions and complete the program meeting his personal goals.   Patient will continue to attend sessions and complete the program meeting his personal goals.   Patient will continue to attend sessions and complete the program meeting his  personal goals.   Patient will continue exercising and continue to meet his personal goals.        Exercise Goals and Review: Exercise Goals    Row Name 06/15/17 1344             Exercise Goals   Increase Physical Activity  Yes       Intervention  Provide advice, education, support and counseling about physical activity/exercise needs.;Develop an individualized exercise prescription for aerobic and resistive training based on initial evaluation findings, risk stratification, comorbidities and participant's personal goals.       Expected Outcomes  Short Term: Attend rehab on a regular basis to increase amount of physical activity.       Increase Strength and Stamina  Yes       Intervention  Provide advice, education, support and counseling about physical activity/exercise  needs.;Develop an individualized exercise prescription for aerobic and resistive training based on initial evaluation findings, risk stratification, comorbidities and participant's personal goals.       Expected Outcomes  Short Term: Increase workloads from initial exercise prescription for resistance, speed, and METs.       Able to understand and use rate of perceived exertion (RPE) scale  Yes       Intervention  Provide education and explanation on how to use RPE scale       Expected Outcomes  Short Term: Able to use RPE daily in rehab to express subjective intensity level;Long Term:  Able to use RPE to guide intensity level when exercising independently       Able to understand and use Dyspnea scale  Yes       Intervention  Provide education and explanation on how to use Dyspnea scale       Expected Outcomes  Long Term: Able to use Dyspnea scale to guide intensity level when exercising independently;Short Term: Able to use Dyspnea scale daily in rehab to express subjective sense of shortness of breath during exertion       Knowledge and understanding of Target Heart Rate Range (THRR)  Yes       Intervention  Provide  education and explanation of THRR including how the numbers were predicted and where they are located for reference       Expected Outcomes  Short Term: Able to state/look up THRR;Long Term: Able to use THRR to govern intensity when exercising independently;Short Term: Able to use daily as guideline for intensity in rehab       Able to check pulse independently  Yes       Intervention  Provide education and demonstration on how to check pulse in carotid and radial arteries.;Review the importance of being able to check your own pulse for safety during independent exercise       Expected Outcomes  Short Term: Able to explain why pulse checking is important during independent exercise;Long Term: Able to check pulse independently and accurately       Understanding of Exercise Prescription  Yes       Intervention  Provide education, explanation, and written materials on patient's individual exercise prescription       Expected Outcomes  Short Term: Able to explain program exercise prescription;Long Term: Able to explain home exercise prescription to exercise independently          Nutrition & Weight - Outcomes: Pre Biometrics - 06/15/17 1345      Pre Biometrics   Height  _0  (1.753 m)    Weight  147 lb (66.7 kg)    Waist Circumference  29 inches    Hip Circumference  33 inches    Waist to Hip Ratio  0.88 %    BMI (Calculated)  21.7    Triceps Skinfold  10 mm    % Body Fat  18.1 %    Grip Strength  64.06 kg    Flexibility  13.66 in    Single Leg Stand  60 seconds      Post Biometrics - 09/12/17 1421       Post  Biometrics   Height  _1  (1.753 m)    Weight  153 lb 4.9 oz (69.5 kg)    Waist Circumference  29 inches    Hip Circumference  32 inches    Waist to Hip Ratio  0.91 %    BMI (Calculated)  22.63    Triceps Skinfold  11 mm    % Body Fat  18.8 %    Grip Strength  84.5 kg    Flexibility  12 in    Single Leg Stand  60 seconds       Nutrition: Nutrition Therapy & Goals -  08/21/17 1132      Nutrition Therapy   RD appointment deferred  Yes      Personal Nutrition Goals   Personal Goal #2  Patient continues to say he eats heart healthy.    Additional Goals?  No       Nutrition Discharge: Nutrition Assessments - 09/17/17 1436      MEDFICTS Scores   Pre Score  33    Post Score  3    Score Difference  -30       Education Questionnaire Score: Knowledge Questionnaire Score - 09/17/17 1435      Knowledge Questionnaire Score   Pre Score  21/24    Post Score  22/24       Goals reviewed with patient; copy given to patient.

## 2017-10-26 ENCOUNTER — Encounter: Payer: Self-pay | Admitting: Cardiology

## 2017-12-16 NOTE — Progress Notes (Signed)
Cardiology Office Note   Date:  12/18/2017   ID:  Kelly Hardy, DOB November 15, 1960, MRN [id number]  PCP:  [name], MD  Cardiologist:   [name], MD    Chief Complaint  Patient presents with  . Coronary Artery Disease     History of Present Illness: 57 y/o Caucasian male with complaints of exertional chest pain 02/01/16. OP stress Myoview done 02/09/16 showed diaphragmatic attenuation with 24mm inferior ST depression and chest pain. He was admitted the next day to [facility] for OP cath which revealed a 95% mRCA. He recieved a DES with good results.  When I last saw him he had angina and was admitted for cath.  This demonstrated dLM-ostial LAD 90% stenosis and 45% mLAD stenosis. The RCA stent was patent. He was discharged ad re admitted Jan 2nd for elective CABG x 1 with an LIMA-LAD. He tolerated this well. EF was 55-60%.  He returns for follow up.    He is done relatively well.  His sugars not well controlled.  He is not as great with his diet as he could be.  He is got a physical job and he is having no chest pressure, neck or arm discomfort.  He has some minimal sternal discomfort.  He has some very mild orthostatic symptoms a time.  He has rare palpitations.  He is not having any presyncope or syncope.  He is not having any weight gain or edema.  Past Medical History:  Diagnosis Date  . Coronary artery disease   . Diabetes mellitus without complication (HCC)    Type I  . GERD (gastroesophageal reflux disease)   . Hyperlipidemia   . Hypertension   . Palpitations    eval 2014    Past Surgical History:  Procedure Laterality Date  . CARDIAC CATHETERIZATION N/A 02/10/2016   Procedure: Left Heart Cath and Coronary Angiography;  Surgeon: [name], MD;  Location: [place] [facility];  Service: Cardiovascular;  Laterality: N/A;  . CARDIAC CATHETERIZATION N/A 02/10/2016   Procedure: Coronary Stent Intervention;  Surgeon: [name], MD;  Location: [place] [facility];   Service: Cardiovascular;  Laterality: N/A;  . CORONARY ARTERY BYPASS GRAFT N/A 04/04/2017   Procedure: CORONARY ARTERY BYPASS GRAFTING (CABG), times one, using left internal mammary artery. Off pump;  Surgeon: Melrose Nakayama, MD;  Location: [place];  Service: Open Heart Surgery;  Laterality: N/A;  . CORONARY STENT PLACEMENT  02/10/2016   STENT XIENCE ALPINE RX [id number] drug eluting stent was successfully placed  . LEFT HEART CATH AND CORONARY ANGIOGRAPHY N/A 03/23/2017   Procedure: LEFT HEART CATH AND CORONARY ANGIOGRAPHY;  Surgeon: Martinique, Peter M, MD;  Location: [place] [facility];  Service: Cardiovascular;  Laterality: N/A;  . SALIVARY GLAND SURGERY Left    benign  . TEE WITHOUT CARDIOVERSION N/A 04/04/2017   Procedure: TRANSESOPHAGEAL ECHOCARDIOGRAM (TEE);  Surgeon: Melrose Nakayama, MD;  Location: [place];  Service: Open Heart Surgery;  Laterality: N/A;     Current Outpatient Medications  Medication Sig Dispense Refill  . acetaminophen (TYLENOL) 500 MG tablet Take 2 tablets (1,000 mg total) by mouth every 8 (eight) hours as needed. 30 tablet 0  . aspirin EC 81 MG tablet Take 81 mg by mouth daily.    . insulin lispro protamine-lispro (HUMALOG 75/25 MIX) (75-25) 100 UNIT/ML SUSP injection Inject 12 Units into the skin 2 (two) times daily with a meal.    . insulin regular (NOVOLIN R,HUMULIN  R) 100 units/mL injection Inject 2-10 Units into the skin 3 (three) times daily before meals. Per Sliding Scale    . losartan (COZAAR) 25 MG tablet Take 1 tablet (25 mg total) by mouth daily. 90 tablet 3  . metoprolol succinate (TOPROL-XL) 25 MG 24 hr tablet Take 25 mg by mouth daily.    . Multiple Vitamins-Minerals (MULTIVITAMIN WITH MINERALS) tablet Take 1 tablet by mouth daily.    Marland Kitchen NOVOFINE 32G X 6 MM MISC USE TO ADMINSTER INSULIN SIX TIMES DAILY.  4  . oxyCODONE (OXY IR/ROXICODONE) 5 MG immediate release tablet Take 1 tablet (5 mg total) by mouth every 4 (four) hours as needed for severe pain. 30  tablet 0  . rosuvastatin (CRESTOR) 40 MG tablet TAKE ONE TABLET BY MOUTH AT BEDTIME. 30 tablet 10   No current facility-administered medications for this visit.     Allergies:   Patient has no known allergies.    ROS:  Please see the history of present illness.   Otherwise, review of systems are positive for none.   All other systems are reviewed and negative.    PHYSICAL EXAM: VS:  BP 135/64   Pulse 77   Ht 5\' 9"  (1.753 m)   Wt 152 lb 3.2 oz (69 kg)   BMI 22.48 kg/m  , BMI Body mass index is 22.48 kg/m.  GENERAL:  Well appearing NECK:  No jugular venous distention, waveform within normal limits, carotid upstroke brisk and symmetric, no bruits, no thyromegaly LUNGS:  Clear to auscultation bilaterally CHEST:  Well healed sternotomy scar. HEART:  PMI not displaced or sustained,S1 and S2 within normal limits, no S3, no S4, no clicks, no rubs, no murmurs ABD:  Flat, positive bowel sounds normal in frequency in pitch, no bruits, no rebound, no guarding, no midline pulsatile mass, no hepatomegaly, no splenomegaly EXT:  2 plus pulses throughout, no edema, no cyanosis no clubbing   EKG:  EKG is  not ot ordered today.   Recent Labs: 04/02/2017: ALT 26 04/05/2017: Magnesium 2.1 04/07/2017: BUN 10; Creatinine, Ser 0.80; Hemoglobin 10.1; Platelets 151; Potassium 4.7; Sodium 136     Lab Results  Component Value Date   HGBA1C 8.5 (H) 04/02/2017    Wt Readings from Last 3 Encounters:  12/18/17 152 lb 3.2 oz (69 kg)  09/12/17 153 lb 4.9 oz (69.5 kg)  07/13/17 151 lb 6.4 oz (68.7 kg)     Other studies Reviewed: Additional studies/ records that were reviewed today include: Labs Review of the above records demonstrates:    ASSESSMENT AND PLAN:  CABG:     The patient has no new sypmtoms.  No further cardiovascular testing is indicated.  We will continue with aggressive risk reduction and meds as listed.  DYSLIPIDEMIA:    LDL was 62 and HDL 42 in July.  No change in therapy.   HTN:     The blood pressure is at target. No change in medications is indicated. We will continue with therapeutic lifestyle changes (TLC).  It is slightly elevated but at target at home  DM:  A1c was 8.5 up from 7.7 .  He understands that this needs to be well controlled and will continue to work with Dr. Quintin Alto.  I asked him to consider talking over initiating Jardiance with Dr. Quintin Alto.     Current medicines are reviewed at length with the patient today.  The patient does not have concerns regarding medicines.  The following changes have been made:  None  Labs/ tests ordered today include:   None  No orders of the defined types were placed in this encounter.    Disposition:   Follow up with me in 12 months. Ronnell Guadalajara, MD  12/18/2017 9:56 AM    [place]

## 2017-12-18 ENCOUNTER — Encounter: Payer: Self-pay | Admitting: Cardiology

## 2017-12-18 ENCOUNTER — Ambulatory Visit (INDEPENDENT_AMBULATORY_CARE_PROVIDER_SITE_OTHER): Payer: Commercial Managed Care - PPO | Admitting: Cardiology

## 2017-12-18 VITALS — BP 135/64 | HR 77 | Ht 69.0 in | Wt 152.2 lb

## 2017-12-18 DIAGNOSIS — E785 Hyperlipidemia, unspecified: Secondary | ICD-10-CM

## 2017-12-18 DIAGNOSIS — Z794 Long term (current) use of insulin: Secondary | ICD-10-CM | POA: Diagnosis not present

## 2017-12-18 DIAGNOSIS — E118 Type 2 diabetes mellitus with unspecified complications: Secondary | ICD-10-CM

## 2017-12-18 DIAGNOSIS — I25110 Atherosclerotic heart disease of native coronary artery with unstable angina pectoris: Secondary | ICD-10-CM | POA: Diagnosis not present

## 2017-12-18 NOTE — Patient Instructions (Signed)

## 2017-12-21 ENCOUNTER — Ambulatory Visit: Payer: Commercial Managed Care - PPO | Admitting: Cardiology

## 2018-04-16 ENCOUNTER — Other Ambulatory Visit (HOSPITAL_COMMUNITY): Payer: Self-pay | Admitting: Physician Assistant

## 2018-04-16 ENCOUNTER — Other Ambulatory Visit: Payer: Self-pay | Admitting: Cardiology

## 2018-04-16 NOTE — Telephone Encounter (Signed)
This is Dr. Hochrein's pt. °

## 2018-07-17 ENCOUNTER — Other Ambulatory Visit: Payer: Self-pay | Admitting: Cardiology

## 2018-09-06 IMAGING — DX DG CHEST 2V
2 series · 2 of 2 positions shown · non-contrast
Comparison: 04/08/2017

CLINICAL DATA: Anterior chest pain, history of recent coronary
bypass grafting

EXAM:
CHEST  2 VIEW

[dg chest 2 view (1 of 2)]
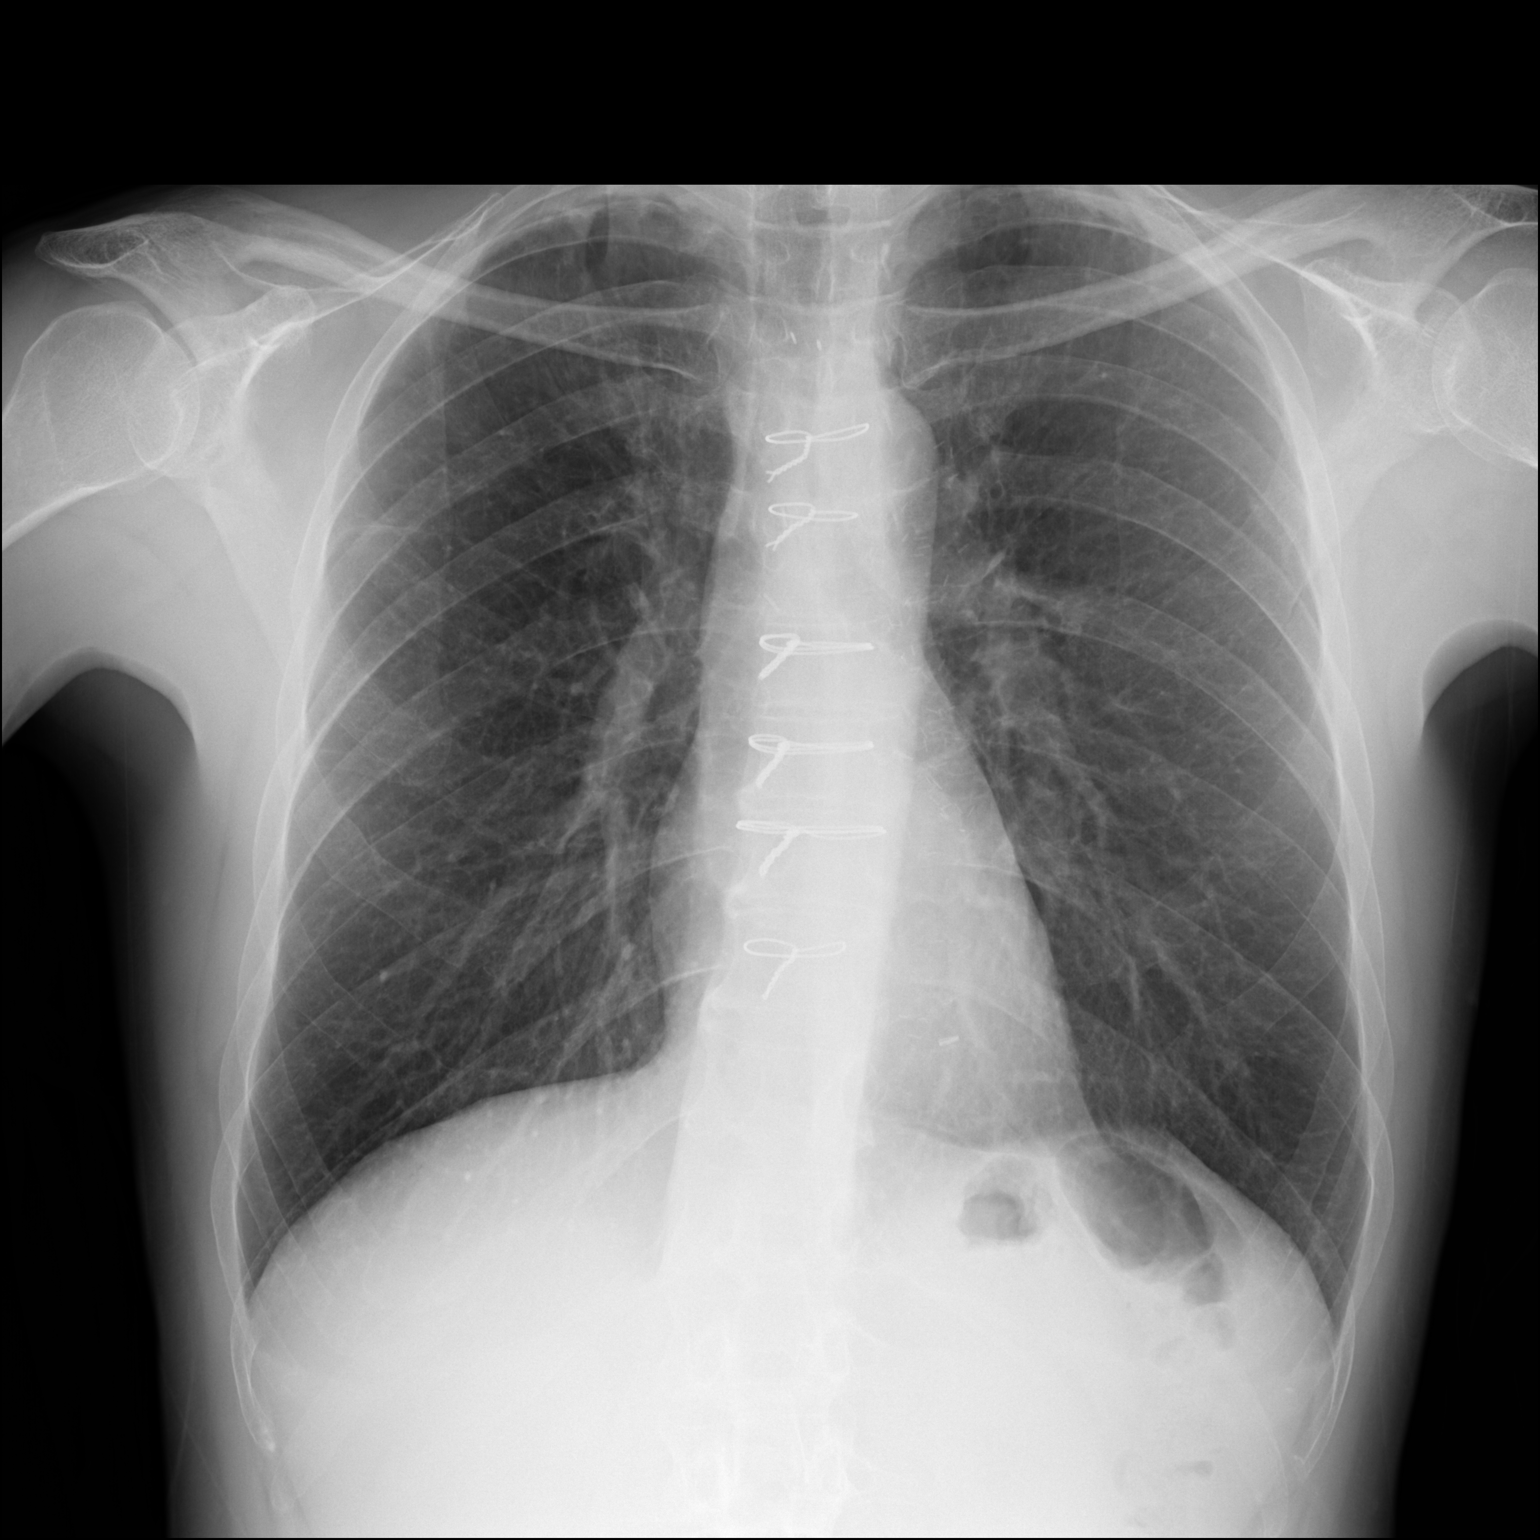

[dg chest 2 view (2 of 2)]
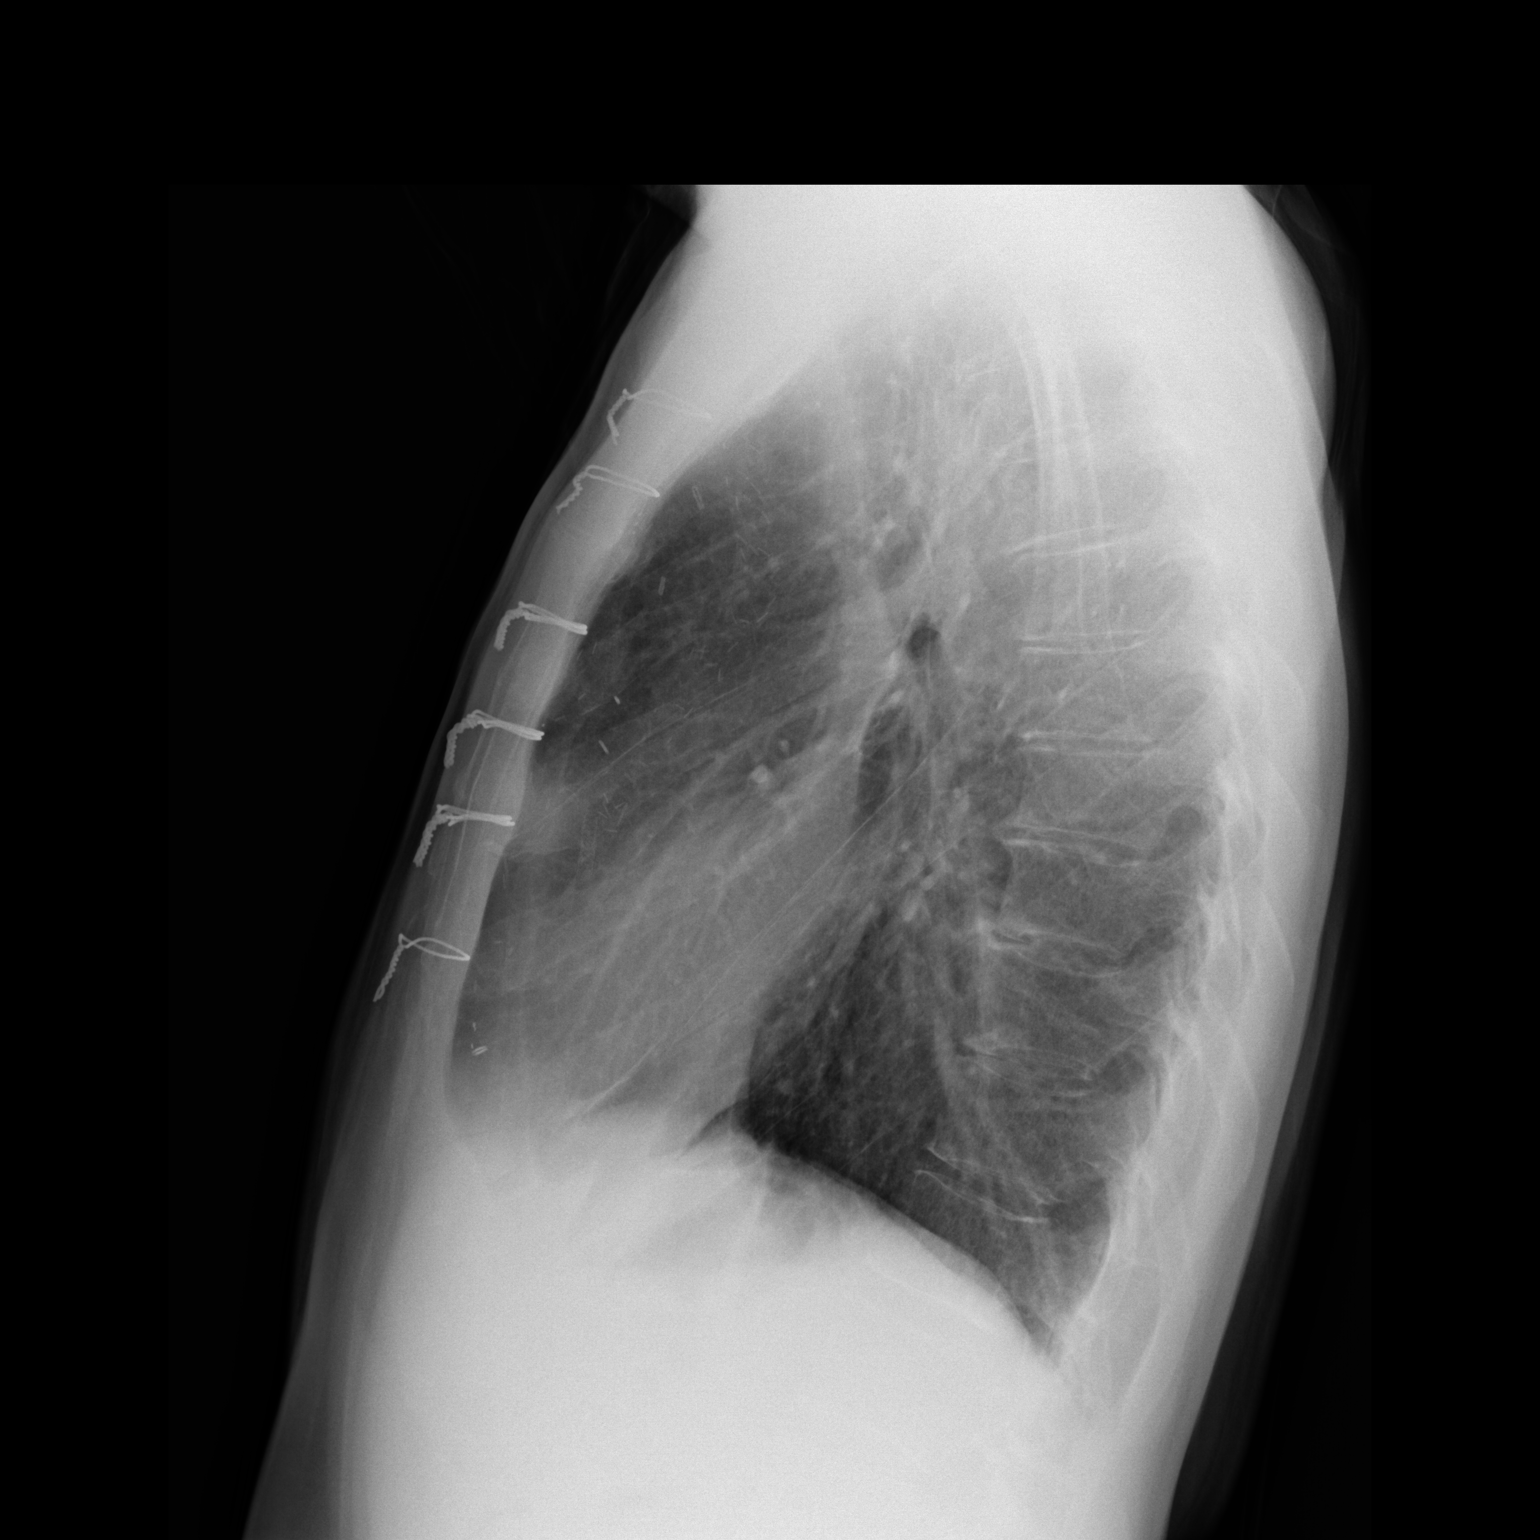

[2 of 2 positions shown; findings below may reference images not displayed]

FINDINGS: Postsurgical changes are again seen and stable. Cardiac shadow is
stable. The lungs are well aerated bilaterally. No focal infiltrate
or sizable effusion is seen. Mild degenerative changes of the
thoracic spine are noted.
IMPRESSION: No acute abnormality seen.

## 2018-09-23 ENCOUNTER — Telehealth: Payer: Self-pay | Admitting: *Deleted

## 2018-09-23 NOTE — Telephone Encounter (Signed)
A message was left, re: follow up visit. 

## 2018-11-01 ENCOUNTER — Other Ambulatory Visit: Payer: Self-pay | Admitting: Cardiology

## 2018-12-27 NOTE — Progress Notes (Signed)
Cardiology Office Note   Date:  12/30/2018   ID:  Kelly Hardy, DOB 05-20-1960, MRN [id number]  PCP:  [name], MD  Cardiologist:   [name], MD    Chief Complaint  Patient presents with  . Chest Pain     History of Present Illness: 58 y/o Caucasian male with complaints of exertional chest pain 02/01/16. OP stress Myoview done 02/09/16 showed diaphragmatic attenuation with 56mm inferior ST depression and chest pain. He was admitted the next day to [facility] for OP cath which revealed a 95% mRCA. He recieved a DES with good results.  I saw him in follow up and he had increased angina and was admitted for cath.  This demonstrated dLM-ostial LAD 90% stenosis and 45% mLAD stenosis. The RCA stent was patent. He was discharged ad re admitted Jan 2nd 2019 for elective CABG x 1 with an LIMA-LAD. He tolerated this well. EF was 55-60%.  He returns for follow up.    He has had a couple of episodes of chest discomfort.  This seems to happen at night when he is lying flat back.  It was 1 night when it was relatively severe that he had to sit in his chair.  It is a little bit like he is previous angina but not quite.  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  He has not had to take any nitroglycerin.  He said no associated symptoms.  Denies any shortness of breath, PND orthopnea.  He has had no weight gain or edema.   Past Medical History:  Diagnosis Date  . Coronary artery disease   . Diabetes mellitus without complication (HCC)    Type I  . GERD (gastroesophageal reflux disease)   . Hyperlipidemia   . Hypertension   . Palpitations    eval 2014    Past Surgical History:  Procedure Laterality Date  . CARDIAC CATHETERIZATION N/A 02/10/2016   Procedure: Left Heart Cath and Coronary Angiography;  Surgeon: [name], MD;  Location: [place] [facility];  Service: Cardiovascular;  Laterality: N/A;  . CARDIAC CATHETERIZATION N/A  02/10/2016   Procedure: Coronary Stent Intervention;  Surgeon: [name], MD;  Location: [place] [facility];  Service: Cardiovascular;  Laterality: N/A;  . CORONARY ARTERY BYPASS GRAFT N/A 04/04/2017   Procedure: CORONARY ARTERY BYPASS GRAFTING (CABG), times one, using left internal mammary artery. Off pump;  Surgeon: Melrose Nakayama, MD;  Location: [place];  Service: Open Heart Surgery;  Laterality: N/A;  . CORONARY STENT PLACEMENT  02/10/2016   STENT XIENCE ALPINE RX 3.25X15 drug eluting stent was successfully placed  . LEFT HEART CATH AND CORONARY ANGIOGRAPHY N/A 03/23/2017   Procedure: LEFT HEART CATH AND CORONARY ANGIOGRAPHY;  Surgeon: Martinique, Peter M, MD;  Location: [place] [facility];  Service: Cardiovascular;  Laterality: N/A;  . SALIVARY GLAND SURGERY Left    benign  . TEE WITHOUT CARDIOVERSION N/A 04/04/2017   Procedure: TRANSESOPHAGEAL ECHOCARDIOGRAM (TEE);  Surgeon: Melrose Nakayama, MD;  Location: [place];  Service: Open Heart Surgery;  Laterality: N/A;     Current Outpatient Medications  Medication Sig Dispense Refill  . acetaminophen (TYLENOL) 500 MG tablet Take 2 tablets (1,000 mg total) by mouth every 8 (eight) hours as needed. 30 tablet 0  . aspirin EC 81 MG tablet Take 81 mg by mouth daily.    . insulin lispro protamine-lispro (HUMALOG 75/25  MIX) (75-25) 100 UNIT/ML SUSP injection Inject 12 Units into the skin 2 (two) times daily with a meal.    . insulin regular (NOVOLIN R,HUMULIN R) 100 units/mL injection Inject 2-10 Units into the skin 3 (three) times daily before meals. Per Sliding Scale    . losartan (COZAAR) 25 MG tablet TAKE ONE TABLET BY MOUTH DAILY. 90 tablet 3  . metoprolol succinate (TOPROL-XL) 50 MG 24 hr tablet Take 1 tablet (50 mg total) by mouth daily. 90 tablet 3  . Multiple Vitamins-Minerals (MULTIVITAMIN WITH MINERALS) tablet Take 1 tablet by mouth daily.    Marland Kitchen NOVOFINE 32G X 6 MM MISC USE TO ADMINSTER INSULIN SIX TIMES DAILY.  4  . oxyCODONE (OXY  IR/ROXICODONE) 5 MG immediate release tablet Take 1 tablet (5 mg total) by mouth every 4 (four) hours as needed for severe pain. 30 tablet 0  . rosuvastatin (CRESTOR) 40 MG tablet TAKE ONE TABLET BY MOUTH AT BEDTIME. 30 tablet 10   No current facility-administered medications for this visit.     Allergies:   Patient has no known allergies.    ROS:  Please see the history of present illness.   Otherwise, review of systems are positive for none.   All other systems are reviewed and negative.    PHYSICAL EXAM: VS:  BP (!) 152/68   Pulse 91   Ht 5\' 9"  (1.753 m)   Wt 154 lb (69.9 kg)   BMI 22.74 kg/m  , BMI Body mass index is 22.74 kg/m.  GENERAL:  Well appearing NECK:  No jugular venous distention, waveform within normal limits, carotid upstroke brisk and symmetric, no bruits, no thyromegaly LUNGS:  Clear to auscultation bilaterally CHEST:  Well healed sternotomy scar. HEART:  PMI not displaced or sustained,S1 and S2 within normal limits, no S3, no S4, no clicks, no rubs, no murmurs ABD:  Flat, positive bowel sounds normal in frequency in pitch, no bruits, no rebound, no guarding, no midline pulsatile mass, no hepatomegaly, no splenomegaly EXT:  2 plus pulses throughout, no edema, no cyanosis no clubbing   EKG:  EKG is  ordered today. Sinus rhythm, rate 91, axis within normal limits, intervals within normal limits, no acute ST-T wave changes.  Recent Labs: No results found for requested labs within last 8760 hours.     Lab Results  Component Value Date   HGBA1C 8.5 (H) 04/02/2017    Wt Readings from Last 3 Encounters:  12/30/18 154 lb (69.9 kg)  12/18/17 152 lb 3.2 oz (69 kg)  09/12/17 153 lb 4.9 oz (69.5 kg)     Other studies Reviewed: Additional studies/ records that were reviewed today include: Labs Review of the above records demonstrates:  See below  ASSESSMENT AND PLAN:  CABG:    The patient does have some chest discomfort.  However, this is somewhat atypical  compared to his previous.-Increase metoprolol as below.  He is going to let me know if he has any increasing symptoms at which point I would have a low threshold to do a POET (Plain Old Exercise Treadmill)  DYSLIPIDEMIA:    LDL was at target at 62.  We will continue the meds as listed.   HTN:    The blood pressure is not at target.  I am going to increase his metoprolol to 50 mg daily.   DM:  A1c was not at target.  It is 8.3.   He might be a candidate for our COORDINATE Trial.  I am going  to screen him for this.  This would involve the addition of SGLT2 behavior or GLP-1 receptor antagonist.  This would also involve the addition of an endocrinologist as part of the trial.     Current medicines are reviewed at length with the patient today.  The patient does not have concerns regarding medicines.  The following changes have been made: As above  Labs/ tests ordered today include:   None  Orders Placed This Encounter  Procedures  . EKG 12-Lead     Disposition:   Follow up with me in  to make it 6 months. Ronnell Guadalajara, MD  12/30/2018 10:17 AM    [place]

## 2018-12-30 ENCOUNTER — Encounter: Payer: Self-pay | Admitting: Cardiology

## 2018-12-30 ENCOUNTER — Ambulatory Visit (INDEPENDENT_AMBULATORY_CARE_PROVIDER_SITE_OTHER): Payer: Commercial Managed Care - PPO | Admitting: Cardiology

## 2018-12-30 ENCOUNTER — Other Ambulatory Visit: Payer: Self-pay

## 2018-12-30 VITALS — BP 152/68 | HR 91 | Ht 69.0 in | Wt 154.0 lb

## 2018-12-30 DIAGNOSIS — Z951 Presence of aortocoronary bypass graft: Secondary | ICD-10-CM

## 2018-12-30 DIAGNOSIS — E785 Hyperlipidemia, unspecified: Secondary | ICD-10-CM | POA: Diagnosis not present

## 2018-12-30 DIAGNOSIS — R072 Precordial pain: Secondary | ICD-10-CM

## 2018-12-30 DIAGNOSIS — I10 Essential (primary) hypertension: Secondary | ICD-10-CM | POA: Diagnosis not present

## 2018-12-30 MED ORDER — METOPROLOL SUCCINATE ER 50 MG PO TB24: 50.0000 mg | ORAL_TABLET | Freq: Every day | ORAL | 3 refills | Status: DC

## 2018-12-30 NOTE — Patient Instructions (Addendum)
Medication Instructions:  Increase Metoprolol to 50mg  daily.   If you need a refill on your cardiac medications before your next appointment, please call your pharmacy.   Lab work: NONE If you have labs (blood work) drawn today and your tests are completely normal, you will receive your results only by: [place] (if you have MyChart) OR A paper copy in the mail If you have any lab test that is abnormal or we need to change your treatment, we will call you to review the results.  Testing/Procedures: NONE  Follow-Up: At [facility], you and your health needs are our priority.  As part of our continuing mission to provide you with exceptional heart care, we have created designated Provider Care Teams.  These Care Teams include your primary Cardiologist (physician) and Advanced Practice Providers (APPs -  Physician Assistants and Nurse Practitioners) who all work together to provide you with the care you need, when you need it. You will need a follow up appointment in 6 months.  Please call our office 2 months in advance to schedule this appointment.  You may see [name], MD or one of the following Advanced Practice Providers on your designated Care Team:   Rosaria Ferries, PA-C Jory Sims, DNP, ANP  Any Other Special Instructions Will Be Listed Below (If Applicable). Expect a call from Benjiman Core, MD - Endocrinology

## 2018-12-31 ENCOUNTER — Ambulatory Visit (INDEPENDENT_AMBULATORY_CARE_PROVIDER_SITE_OTHER): Payer: Commercial Managed Care - PPO | Admitting: Internal Medicine

## 2018-12-31 ENCOUNTER — Other Ambulatory Visit: Payer: Self-pay | Admitting: Internal Medicine

## 2018-12-31 ENCOUNTER — Encounter: Payer: Self-pay | Admitting: *Deleted

## 2018-12-31 ENCOUNTER — Encounter: Payer: Self-pay | Admitting: Internal Medicine

## 2018-12-31 VITALS — BP 150/70 | HR 86 | Ht 69.0 in | Wt 153.0 lb

## 2018-12-31 DIAGNOSIS — E1359 Other specified diabetes mellitus with other circulatory complications: Secondary | ICD-10-CM

## 2018-12-31 DIAGNOSIS — E1159 Type 2 diabetes mellitus with other circulatory complications: Secondary | ICD-10-CM | POA: Insufficient documentation

## 2018-12-31 DIAGNOSIS — E119 Type 2 diabetes mellitus without complications: Secondary | ICD-10-CM | POA: Insufficient documentation

## 2018-12-31 DIAGNOSIS — E1059 Type 1 diabetes mellitus with other circulatory complications: Secondary | ICD-10-CM | POA: Insufficient documentation

## 2018-12-31 DIAGNOSIS — Z794 Long term (current) use of insulin: Secondary | ICD-10-CM | POA: Diagnosis not present

## 2018-12-31 DIAGNOSIS — Z006 Encounter for examination for normal comparison and control in clinical research program: Secondary | ICD-10-CM

## 2018-12-31 DIAGNOSIS — IMO0001 Reserved for inherently not codable concepts without codable children: Secondary | ICD-10-CM

## 2018-12-31 LAB — GLUCOSE, POCT (MANUAL RESULT ENTRY): POC Glucose: 132 mg/dl — AB (ref 70–99)

## 2018-12-31 MED ORDER — TRESIBA FLEXTOUCH 100 UNIT/ML ~~LOC~~ SOPN: 18.0000 [IU] | PEN_INJECTOR | Freq: Every day | SUBCUTANEOUS | 3 refills | Status: DC

## 2018-12-31 MED ORDER — INSULIN LISPRO (1 UNIT DIAL) 100 UNIT/ML (KWIKPEN): 2.0000 [IU] | PEN_INJECTOR | Freq: Three times a day (TID) | SUBCUTANEOUS | 3 refills | Status: DC

## 2018-12-31 MED ORDER — GLUCAGON 3 MG/DOSE NA POWD: 3.0000 mg | Freq: Once | NASAL | 11 refills | Status: DC | PRN

## 2018-12-31 MED ORDER — INSULIN PEN NEEDLE 32G X 4 MM MISC: 3 refills | Status: DC

## 2018-12-31 NOTE — Addendum Note (Signed)
Addended by: Kaylyn Lim I on: 12/31/2018 04:52 PM   Modules accepted: Orders

## 2018-12-31 NOTE — Research (Signed)
Coordinate Informed Consent   Subject Name: Kelly Hardy  Subject met inclusion and exclusion criteria.  The informed consent form, study requirements and expectations were reviewed with the subject and questions and concerns were addressed prior to the signing of the consent form.  The subject verbalized understanding of the trial requirements.  The subject agreed to participate in the Coordinate trial and signed the informed consent at 1025on 12/31/2018.  The informed consent was obtained prior to performance of any protocol-specific procedures for the subject.  A copy of the signed informed consent was given to the subject and a copy was placed in the subject's medical record.   Oletta Cohn.   This was a verbal consent.

## 2018-12-31 NOTE — Patient Instructions (Addendum)
Please stop the 75/25 insulin.  Please add: - Tresiba 18-20 units daily  Change: - Humalog: Insulin to carb ratio: 1:12 Target: 120 Sensitivity factor: 1:40  Please stop at the lab.  Please return in 1.5 months with your sugar log.   PATIENT INSTRUCTIONS FOR TYPE 2 DIABETES:  **Please join MyChart!** - see attached instructions about how to join if you have not done so already.  DIET AND EXERCISE Diet and exercise is an important part of diabetic treatment.  We recommended aerobic exercise in the form of brisk walking (working between 40-60% of maximal aerobic capacity, similar to brisk walking) for 150 minutes per week (such as 30 minutes five days per week) along with 3 times per week performing 'resistance' training (using various gauge rubber tubes with handles) 5-10 exercises involving the major muscle groups (upper body, lower body and core) performing 10-15 repetitions (or near fatigue) each exercise. Start at half the above goal but build slowly to reach the above goals. If limited by weight, joint pain, or disability, we recommend daily walking in a swimming pool with water up to waist to reduce pressure from joints while allow for adequate exercise.    BLOOD GLUCOSES Monitoring your blood glucoses is important for continued management of your diabetes. Please check your blood glucoses 2-4 times a day: fasting, before meals and at bedtime (you can rotate these measurements - e.g. one day check before the 3 meals, the next day check before 2 of the meals and before bedtime, etc.).   HYPOGLYCEMIA (low blood sugar) Hypoglycemia is usually a reaction to not eating, exercising, or taking too much insulin/ other diabetes drugs.  Symptoms include tremors, sweating, hunger, confusion, headache, etc. Treat IMMEDIATELY with 15 grams of Carbs: . 4 glucose tablets .  cup regular juice/soda . 2 tablespoons raisins . 4 teaspoons sugar . 1 tablespoon honey Recheck blood glucose in 15  mins and repeat above if still symptomatic/blood glucose <100.  RECOMMENDATIONS TO REDUCE YOUR RISK OF DIABETIC COMPLICATIONS: * Take your prescribed MEDICATION(S) * Follow a DIABETIC diet: Complex carbs, fiber rich foods, (monounsaturated and polyunsaturated) fats * AVOID saturated/trans fats, high fat foods, >2,300 mg salt per day. * EXERCISE at least 5 times a week for 30 minutes or preferably daily.  * DO NOT SMOKE OR DRINK more than 1 drink a day. * Check your FEET every day. Do not wear tightfitting shoes. Contact us if you develop an ulcer * See your EYE doctor once a year or more if needed * Get a FLU shot once a year * Get a PNEUMONIA vaccine once before and once after age 5 years  GOALS:  * Your Hemoglobin A1c of <7%  * fasting sugars need to be <130 * after meals sugars need to be <180 (2h after you start eating) * Your Systolic BP should be [id number] or lower  * Your Diastolic BP should be 80 or lower  * Your HDL (Good Cholesterol) should be 40 or higher  * Your LDL (Bad Cholesterol) should be 100 or lower. * Your Triglycerides should be 150 or lower  * Your Urine microalbumin (kidney function) should be <30 * Your Body Mass Index should be 25 or lower    Please consider the following ways to cut down carbs and fat and increase fiber and micronutrients in your diet: - substitute whole grain for white bread or pasta - substitute brown rice for white rice - substitute 90-calorie flat bread pieces for slices of bread  when possible - substitute sweet potatoes or yams for white potatoes - substitute humus for margarine - substitute tofu for cheese when possible - substitute almond or rice milk for regular milk (would not drink soy milk daily due to concern for soy estrogen influence on breast cancer risk) - substitute dark chocolate for other sweets when possible - substitute water - can add lemon or orange slices for taste - for diet sodas (artificial sweeteners will trick your  body that you can eat sweets without getting calories and will lead you to overeating and weight gain in the long run) - do not skip breakfast or other meals (this will slow down the metabolism and will result in more weight gain over time)  - can try smoothies made from fruit and almond/rice milk in am instead of regular breakfast - can also try old-fashioned (not instant) oatmeal made with almond/rice milk in am - order the dressing on the side when eating salad at a restaurant (pour less than half of the dressing on the salad) - eat as little meat as possible - can try juicing, but should not forget that juicing will get rid of the fiber, so would alternate with eating raw veg./fruits or drinking smoothies - use as little oil as possible, even when using olive oil - can dress a salad with a mix of balsamic vinegar and lemon juice, for e.g. - use agave nectar, stevia sugar, or regular sugar rather than artificial sweateners - steam or broil/roast veggies  - snack on veggies/fruit/nuts (unsalted, preferably) when possible, rather than processed foods - reduce or eliminate aspartame in diet (it is in diet sodas, chewing gum, etc) Read the labels!  Try to read Dr. Janene Harvey book: "Program for Reversing Diabetes" for other ideas for healthy eating.

## 2018-12-31 NOTE — Progress Notes (Addendum)
Patient ID: Kelly Hardy, male   DOB: [birth year], 58 y.o.   MRN: [id number]   HPI: Kelly Hardy is a 58 y.o.-year-old male, referred by his cardiologist, Dr. Percival Spanish, for management of DM-unclear if type I or type II, dx in 1993, insulin-dependent since 1994, uncontrolled, with complications (CAD - s/p DES 2017, CABG x1 2019; PN). He saw Dr. Forde Dandy until few years ago.   Reviewed latest HbA1c level: 10/11/2018: HbA1c 8.3% 10/19/2017: HbA1c 8.5% Lab Results  Component Value Date   HGBA1C 8.5 (H) 04/02/2017   Pt is on a regimen of: - Humalog 75/25 15 units 2x a day before meals - Humalog 2-8 units 3x a day, before meals - per sliding scale: ISF 50, target 120, ICR ~1:20  Pt checks his sugars 5-6 a day and they are: - am: 115, 150-260, 311 - 2h after b'fast: n/c - before lunch: 115-250 - 2h after lunch: n/c - before dinner: 130-160 - 2h after dinner: n/c - bedtime: 130-160, 260 - nighttime: 52-60 x3 in last mo Lowest sugar was 52; he has hypoglycemia awareness at 70.  Highest sugar was 311.  Glucometer: One Touch Verio  Pt's meals are: - Breakfast: egg sandwich or whole wheat bread + mayo, coffee - Lunch: microwaveable lunch, chicken sandwich, PB crackers or something else light - Dinner: sandwich with mayo + lean meat: [place] or ham, no veggies usually - Snacks: crackers, almonds at night Is walking for exercise  - no CKD, last BUN/creatinine:  10/11/2018: 13/0.94, GFR 89, glucose 271, ACR 8 10/19/2017: 16/0.92, glucose 302, ACR <3.7 Lab Results  Component Value Date   BUN 10 04/07/2017   BUN 10 04/06/2017   CREATININE 0.80 04/07/2017   CREATININE 0.77 04/06/2017  On Cozaar 25.  -+ HL; last set of lipids: 10/11/2018: 106/41/36/62 10/19/2017: 112/46/41/62 No results found for: CHOL, HDL, LDLCALC, LDLDIRECT, TRIG, CHOLHDL On Crestor 40.  - last eye exam was in 09/2018: No DR.   - + numbness and tingling - plantar aspect of his feet - under toes.  He also has  occasional leg cramps.  On ASA 81.  He also has a history of HTN.  Pt has FH of DM in daughter - type 1 - dx. At 70 y/o. Patient's wife had breast cancer.  ROS: Constitutional: no weight gain, no weight loss, + fatigue, no subjective hyperthermia, + subjective hypothermia, no nocturia Eyes: no blurry vision, no xerophthalmia ENT: no sore throat, no nodules palpated in neck, no dysphagia, no odynophagia, no hoarseness, + tinnitus, no hypoacusis Cardiovascular: no CP, no SOB, + palpitations, no leg swelling Respiratory: no cough, no SOB, no wheezing Gastrointestinal: no N, no V, no D, no C, + acid reflux Musculoskeletal: + Muscle aches, no joint aches Skin: no rash, no hair loss Neurological: no tremors, no numbness or tingling/no dizziness/no HAs Psychiatric: no depression, no anxiety + Difficulty with erections  Past Medical History:  Diagnosis Date  . Coronary artery disease   . Diabetes mellitus without complication (HCC)    Type I  . GERD (gastroesophageal reflux disease)   . Hyperlipidemia   . Hypertension   . Palpitations    eval 2014   Past Surgical History:  Procedure Laterality Date  . CARDIAC CATHETERIZATION N/A 02/10/2016   Procedure: Left Heart Cath and Coronary Angiography;  Surgeon: [name], MD;  Location: [place] [facility];  Service: Cardiovascular;  Laterality: N/A;  . CARDIAC CATHETERIZATION N/A 02/10/2016   Procedure: Coronary Stent Intervention;  Surgeon: [name], MD;  Location: [place] [facility];  Service: Cardiovascular;  Laterality: N/A;  . CORONARY ARTERY BYPASS GRAFT N/A 04/04/2017   Procedure: CORONARY ARTERY BYPASS GRAFTING (CABG), times one, using left internal mammary artery. Off pump;  Surgeon: Melrose Nakayama, MD;  Location: [facility];  Service: Open Heart Surgery;  Laterality: N/A;  . CORONARY STENT PLACEMENT  02/10/2016   STENT XIENCE ALPINE RX 3.25X15 drug eluting stent was successfully placed  . LEFT HEART CATH AND CORONARY  ANGIOGRAPHY N/A 03/23/2017   Procedure: LEFT HEART CATH AND CORONARY ANGIOGRAPHY;  Surgeon: Martinique, Peter M, MD;  Location: [place] [facility];  Service: Cardiovascular;  Laterality: N/A;  . SALIVARY GLAND SURGERY Left    benign  . TEE WITHOUT CARDIOVERSION N/A 04/04/2017   Procedure: TRANSESOPHAGEAL ECHOCARDIOGRAM (TEE);  Surgeon: Melrose Nakayama, MD;  Location: [facility];  Service: Open Heart Surgery;  Laterality: N/A;   Social History   Socioeconomic History  . Marital status: Married    Spouse name: Not on file  . Number of children: 2  . Years of education: Not on file  . Highest education level: Not on file  Occupational History  . Occupation: Freight forwarder at [employer]   Social Needs  . Financial resource strain: Not on file  . Food insecurity    Worry: Not on file    Inability: Not on file  . Transportation needs    Medical: Not on file    Non-medical: Not on file  Tobacco Use  . Smoking status: Never Smoker  . Smokeless tobacco: Never Used  Substance and Sexual Activity  . Alcohol use: No  . Drug use: No   Current Outpatient Medications on File Prior to Visit  Medication Sig Dispense Refill  . acetaminophen (TYLENOL) 500 MG tablet Take 2 tablets (1,000 mg total) by mouth every 8 (eight) hours as needed. 30 tablet 0  . aspirin EC 81 MG tablet Take 81 mg by mouth daily.    . insulin lispro protamine-lispro (HUMALOG 75/25 MIX) (75-25) 100 UNIT/ML SUSP injection Inject 12 Units into the skin 2 (two) times daily with a meal.    . losartan (COZAAR) 25 MG tablet TAKE ONE TABLET BY MOUTH DAILY. 90 tablet 3  . metoprolol succinate (TOPROL-XL) 50 MG 24 hr tablet Take 1 tablet (50 mg total) by mouth daily. 90 tablet 3  . Multiple Vitamins-Minerals (MULTIVITAMIN WITH MINERALS) tablet Take 1 tablet by mouth daily.    Marland Kitchen NOVOFINE 32G X 6 MM MISC USE TO ADMINSTER INSULIN SIX TIMES DAILY.  4  . oxyCODONE (OXY IR/ROXICODONE) 5 MG immediate release tablet Take 1 tablet (5 mg total) by  mouth every 4 (four) hours as needed for severe pain. 30 tablet 0  . rosuvastatin (CRESTOR) 40 MG tablet TAKE ONE TABLET BY MOUTH AT BEDTIME. 30 tablet 10   No current facility-administered medications on file prior to visit.    No Known Allergies Family History  Problem Relation Age of Onset  . Coronary artery disease Mother 91       Died age 39, CABG  . CAD Father 30       Died age 45, CABG  . CAD Brother 67       Stents  . Heart failure Maternal Grandmother     PE: BP (!) 150/70   Pulse 86   Ht 5\' 9"  (1.753 m)   Wt 153 lb (69.4 kg)   SpO2 98%   BMI  22.59 kg/m  Wt Readings from Last 3 Encounters:  12/31/18 153 lb (69.4 kg)  12/30/18 154 lb (69.9 kg)  12/18/17 152 lb 3.2 oz (69 kg)   Constitutional: normal weight, in NAD Eyes: PERRLA, EOMI, no exophthalmos ENT: moist mucous membranes, no thyromegaly, no cervical lymphadenopathy Cardiovascular: RRR, No MRG Respiratory: CTA B Gastrointestinal: abdomen soft, NT, ND, BS+ Musculoskeletal: no deformities, strength intact in all 4 Skin: moist, warm, no rashes Neurological: no tremor with outstretched hands, DTR normal in all 4  ASSESSMENT: 1. DM2, insulin-dependent, uncontrolled, with complications - CAD - s/p DES 2017, CABG x1 04/2017 - PN  PLAN:  1. Patient with long-standing, uncontrolled diabetes, on premixed insulin regimen with added rapid acting insulin before meals, with still poor control.  He has blood sugars between 50s and 300s. -At this visit, we discussed about his diabetes history.  He has a long history of insulin dependence and it is conceivable that he developed insulin deficiency.  However, it is unclear whether his initial diagnosis was type 1 or type 2 diabetes.  We decided to check his insulin production today (CBG at the time of this visit is in the 130s, which allows [place] to check a C-peptide).   We will check the following labs today: Orders Placed This Encounter  Procedures  . ZNT8 Antibodies  .  Anti-islet cell antibody  . Glutamic acid decarboxylase auto abs  . C-peptide  . Glucose, fasting  -Even if he has insulin production, he would probably need to continue on long and short-acting insulin, however, we discussed that a premixed insulin regimen is not very flexible.  I suggested a basal-bolus insulin regimen with Tyler Aas as his main insulin, since this is longer acting and conducive to less glucose variability.  We will add Humalog with every meal but we discussed about how to calculate the dose depending on the number of carbs that he will eat and the sugars before the meal.  As of now, he is guesstimating the dose of insulin closely based on the sugars before the meal and the meal carbs, but I advised him how to calculate this more precisely. -Due to his heart history, we also discussed about possibly benefiting from an SGLT 2 inhibitor or GLP-1 receptor agonist.  Since he may be insulin deficient, she is not overweight, and he complains of dehydration related muscle cramps, I do not feel that he is a good candidate for an SGLT 2 inhibitor however, we could definitely use a GLP-1 receptor agonist if he has insulin production.  If he does have type 1 diabetes, I explained the GLP-1 receptor agonist are used off label in this condition but I am not sure whether his insurance will cover this.  We will wait for the results of his type 1 diabetes investigation to make this decision. -We also reviewed his meals and we discussed that ideally he would limit processed food intake and include more vegetables/fruit with his meals.  He agrees to do this. - I suggested to:  Patient Instructions  Please stop the 75/25 insulin.  Please add: - Tresiba 18-20 units daily  Change: - Humalog: Insulin to carb ratio: 1:12 Target: 120 Sensitivity factor: 1:40  Please stop at the lab.  Please return in 1.5 months with your sugar log.   - Strongly advised him to start checking sugars at different times  of the day - check 3-4x a day, rotating checks - discussed about CBG targets for treatment: 80-130 mg/dL before meals  and <180 mg/dL after meals; target HbA1c <7%. - given sugar log and advised how to fill it and to bring it at next appt  - given foot care handout and explained the principles  - given instructions for hypoglycemia management "15-15 rule"  - advised for yearly eye exams  - Return to clinic in 1.5 mo with sugar log and we will check an HbA1c then  Component     Latest Ref Rng & Units 12/31/2018  ZNT8 Antibodies     U/mL <15  Glutamic Acid Decarb Ab     <5 IU/mL <5  C-Peptide     0.80 - 3.85 ng/mL <0.10 (L)  Glucose, Plasma     65 - 99 mg/dL 94  POC Glucose     70 - 99 mg/dl 132 (A)  Islet Cell Ab     Neg:<1:1 Negative   Antipancreatic antibodies are not elevated, however, his C-peptide is completely suppressed.  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  I would suggest for now to continue the regimen as designed above, but depending on his sugars at next visit, we may be able to use a GLP-1 receptor agonist.  Philemon Kingdom, MD PhD [facility] Endocrinology

## 2019-01-01 ENCOUNTER — Telehealth: Payer: Self-pay

## 2019-01-01 LAB — ANTI-ISLET CELL ANTIBODY: Islet Cell Ab: NEGATIVE

## 2019-01-01 NOTE — Telephone Encounter (Signed)
Patient was Rx'd [place] but his insurance only coverers Toujeo.  Please advise.

## 2019-01-01 NOTE — Telephone Encounter (Signed)
OK to send Toujeo the same dose, taken at bedtime, though, as opposed to [place], which can be taken at any time of the day.  Also, please let him know that not all labs are back but his insulin production is low, meaning that he has a type of type 1 diabetes. In this case, I would hold off Ozempic/Trulicity and continue with the insulin regimen as designed at the time of the visit.

## 2019-01-02 MED ORDER — TOUJEO MAX SOLOSTAR 300 UNIT/ML ~~LOC~~ SOPN: 18.0000 [IU] | PEN_INJECTOR | Freq: Every day | SUBCUTANEOUS | 2 refills | Status: DC

## 2019-01-02 NOTE — Telephone Encounter (Signed)
Notified patient of message from Dr. Cruzita Lederer, patient expressed understanding and agreement. No further questions.  RX sent and med list updated.

## 2019-01-03 LAB — C-PEPTIDE: C-Peptide: <0.1 ng/mL — ABNORMAL LOW (ref 0.80–3.85)

## 2019-01-03 LAB — GLUTAMIC ACID DECARBOXYLASE AUTO ABS: Glutamic Acid Decarb Ab: <5 IU/mL (ref <5)

## 2019-01-03 LAB — GLUCOSE, FASTING: Glucose, Plasma: 94 mg/dL (ref 65–99)

## 2019-01-08 ENCOUNTER — Telehealth: Payer: Self-pay | Admitting: Internal Medicine

## 2019-01-08 NOTE — Telephone Encounter (Signed)
Clifford with [employer] [phone] called re: status of PA for Tresiba Flextouch 100 units.

## 2019-01-08 NOTE — Telephone Encounter (Signed)
We are not doing a PA, we sent another medication instead. I did mark this on CovermyMeds so they should know.

## 2019-01-09 LAB — ZNT8 ANTIBODIES: ZNT8 Antibodies: <15 U/mL

## 2019-02-08 ENCOUNTER — Other Ambulatory Visit: Payer: Self-pay | Admitting: Physician Assistant

## 2019-02-14 ENCOUNTER — Ambulatory Visit: Payer: Commercial Managed Care - PPO | Admitting: Internal Medicine

## 2019-02-18 ENCOUNTER — Other Ambulatory Visit: Payer: Self-pay | Admitting: *Deleted

## 2019-02-18 DIAGNOSIS — Z20822 Contact with and (suspected) exposure to covid-19: Secondary | ICD-10-CM

## 2019-02-19 LAB — NOVEL CORONAVIRUS, NAA: SARS-CoV-2, NAA: DETECTED — AB

## 2019-03-03 ENCOUNTER — Ambulatory Visit (INDEPENDENT_AMBULATORY_CARE_PROVIDER_SITE_OTHER): Payer: Commercial Managed Care - PPO | Admitting: Internal Medicine

## 2019-03-03 ENCOUNTER — Other Ambulatory Visit: Payer: Self-pay

## 2019-03-03 ENCOUNTER — Encounter: Payer: Self-pay | Admitting: Internal Medicine

## 2019-03-03 DIAGNOSIS — E1059 Type 1 diabetes mellitus with other circulatory complications: Secondary | ICD-10-CM

## 2019-03-03 DIAGNOSIS — E7800 Pure hypercholesterolemia, unspecified: Secondary | ICD-10-CM

## 2019-03-03 MED ORDER — INSULIN PEN NEEDLE 32G X 4 MM MISC: 3 refills | Status: DC

## 2019-03-03 NOTE — Patient Instructions (Addendum)
Please continue: - Toujeo 20 units but move this to am - Humalog: Insulin to carb ratio: 1:12 (but 1:10 with starches: rice, pasta, potatoes, etc). Target: 120 Sensitivity factor: 1:40  Please return in 1.5 months with your sugar log.

## 2019-03-03 NOTE — Progress Notes (Signed)
Patient ID: Kelly Hardy, male   DOB: 06/13/1960, 58 y.o.   MRN: [id number]   Patient location: Home My location: Office  Referring Provider: [name], MD  I connected with the patient on 03/03/19 at  1:19 PM EST by a video enabled telemedicine application and verified that I am speaking with the correct person.   I discussed the limitations of evaluation and management by telemedicine and the availability of in person appointments. The patient expressed understanding and agreed to proceed.   Details of the encounter are shown below.  HPI: Kelly Hardy is a 58 y.o.-year-old male, referred by his cardiologist, Dr. Percival Spanish, for management of DM type I, dx in 1993 as DM type II, insulin-dependent since 1994, uncontrolled, with complications (CAD - s/p DES 2017, CABG x1 2019; PN). He saw Dr. Forde Dandy until few years ago.  Last visit with me 2 months ago.  He was recently diagnosed with COVID-19 - 12 days ago.  He is feeling well.  Reviewed his HbA1c levels: 10/11/2018: HbA1c 8.3% 10/19/2017: HbA1c 8.5% Lab Results  Component Value Date   HGBA1C 8.5 (H) 04/02/2017   At last visit, he was found to have insulin deficiency (type 1 diabetes): Component     Latest Ref Rng & Units 12/31/2018  ZNT8 Antibodies     U/mL <15  Glutamic Acid Decarb Ab     <5 IU/mL <5  C-Peptide     0.80 - 3.85 ng/mL <0.10 (L)  Glucose, Plasma     65 - 99 mg/dL 94  POC Glucose     70 - 99 mg/dl 132 (A)  Islet Cell Ab     Neg:<1:1 Negative  His antipancreatic antibodies were not elevated but C-peptide was completely suppressed.  Pt was on a regimen of: - Humalog 75/25 15 units 2x a day before meals - Humalog 2-8 units 3x a day, before meals - per sliding scale: ISF 50, target 120, ICR ~1:20  At last visit, we changed to: - Toujeo 20 units at bedtime Tyler Aas was not covered) - Humalog: Insulin to carb ratio: 1:12 Target: 120 Sensitivity factor: 1:40  Pt checks his sugars 5-6 times a day: -  am: 115, 150-260, 311 >> 45, 54-131, 201, 228 - 2h after b'fast: n/c - before lunch: 115-250 >> 97-225, 241 - 2h after lunch: n/c - before dinner: 130-160 >> 208-240 - 2h after dinner: n/c - bedtime: 130-160, 260 >> 160-200 - nighttime: 52-60 x3 in last mo >> 76 Lowest sugar was 52 >> 45; he has hypoglycemia awareness at 70.  Highest sugar was 311 >> 300s.  Glucometer: One Touch Verio  Pt's meals are: - Breakfast: egg sandwich or whole wheat bread + mayo, coffee - Lunch: microwaveable lunch, chicken sandwich, PB crackers or something else light - Dinner: sandwich with mayo + lean meat: [place] or ham, no veggies usually - Snacks: crackers, almonds at night He is usually walking for exercise.  -No CKD, last BUN/creatinine:  10/11/2018: 13/0.94, GFR 89, glucose 271, ACR 8 10/19/2017: 16/0.92, glucose 302, ACR <3.7 Lab Results  Component Value Date   BUN 10 04/07/2017   BUN 10 04/06/2017   CREATININE 0.80 04/07/2017   CREATININE 0.77 04/06/2017  On Cozaar 25.  -+ HL, last lipid panel: 10/11/2018: 106/41/36/62 10/19/2017: 112/46/41/62 No results found for: CHOL, HDL, LDLCALC, LDLDIRECT, TRIG, CHOLHDL On Crestor 40.  - last eye exam was in 09/2018: No DR  -+ Numbness and tingling -on plantar aspect of his feet-under  his toes.  He also has occasional leg cramps.  On ASA 81.  He also has a history of HTN  Pt has FH of DM in daughter - type 1 - dx. At 58 y/o. Patient's wife had breast cancer.  ROS: Constitutional: no weight gain/no weight loss, + fatigue, no subjective hyperthermia, no subjective hypothermia Eyes: no blurry vision, no xerophthalmia ENT: no sore throat, no nodules palpated in neck, no dysphagia, no odynophagia, no hoarseness Cardiovascular: no CP/no SOB/no palpitations/no leg swelling Respiratory: no cough/no SOB/no wheezing Gastrointestinal: no N/no V/no D/no C/+ acid reflux Musculoskeletal: no muscle aches/no joint aches Skin: no rashes, no hair  loss Neurological: no tremors/no numbness/no tingling/no dizziness  I reviewed pt's medications, allergies, PMH, social hx, family hx, and changes were documented in the history of present illness. Otherwise, unchanged from my initial visit note.  Past Medical History:  Diagnosis Date  . Coronary artery disease   . Diabetes mellitus without complication (HCC)    Type I  . GERD (gastroesophageal reflux disease)   . Hyperlipidemia   . Hypertension   . Palpitations    eval 2014   Past Surgical History:  Procedure Laterality Date  . CARDIAC CATHETERIZATION N/A 02/10/2016   Procedure: Left Heart Cath and Coronary Angiography;  Surgeon: [name], MD;  Location: [facility];  Service: Cardiovascular;  Laterality: N/A;  . CARDIAC CATHETERIZATION N/A 02/10/2016   Procedure: Coronary Stent Intervention;  Surgeon: [name], MD;  Location: [place] [facility];  Service: Cardiovascular;  Laterality: N/A;  . CORONARY ARTERY BYPASS GRAFT N/A 04/04/2017   Procedure: CORONARY ARTERY BYPASS GRAFTING (CABG), times one, using left internal mammary artery. Off pump;  Surgeon: Melrose Nakayama, MD;  Location: [place];  Service: Open Heart Surgery;  Laterality: N/A;  . CORONARY STENT PLACEMENT  02/10/2016   STENT XIENCE ALPINE RX 3.25X15 drug eluting stent was successfully placed  . LEFT HEART CATH AND CORONARY ANGIOGRAPHY N/A 03/23/2017   Procedure: LEFT HEART CATH AND CORONARY ANGIOGRAPHY;  Surgeon: Martinique, Peter M, MD;  Location: [place] [facility];  Service: Cardiovascular;  Laterality: N/A;  . SALIVARY GLAND SURGERY Left    benign  . TEE WITHOUT CARDIOVERSION N/A 04/04/2017   Procedure: TRANSESOPHAGEAL ECHOCARDIOGRAM (TEE);  Surgeon: Melrose Nakayama, MD;  Location: [facility];  Service: Open Heart Surgery;  Laterality: N/A;   Social History   Socioeconomic History  . Marital status: Married    Spouse name: Not on file  . Number of children: 2  . Years of education: Not on file   . Highest education level: Not on file  Occupational History  . Occupation: Freight forwarder at [employer]   Social Needs  . Financial resource strain: Not on file  . Food insecurity    Worry: Not on file    Inability: Not on file  . Transportation needs    Medical: Not on file    Non-medical: Not on file  Tobacco Use  . Smoking status: Never Smoker  . Smokeless tobacco: Never Used  Substance and Sexual Activity  . Alcohol use: No  . Drug use: No   Current Outpatient Medications on File Prior to Visit  Medication Sig Dispense Refill  . acetaminophen (TYLENOL) 500 MG tablet Take 2 tablets (1,000 mg total) by mouth every 8 (eight) hours as needed. 30 tablet 0  . aspirin EC 81 MG tablet Take 81 mg by mouth daily.    . Glucagon 3 MG/DOSE POWD  Place 3 mg into the nose once as needed for up to 1 dose. 1 each 11  . Insulin Glargine, 2 Unit Dial, (TOUJEO MAX SOLOSTAR) 300 UNIT/ML SOPN Inject 18-20 Units into the skin at bedtime. 5 pen 2  . insulin lispro (HUMALOG KWIKPEN) 100 UNIT/ML KwikPen Inject 0.02-0.1 mLs (2-10 Units total) into the skin 3 (three) times daily. 15 mL 3  . insulin lispro protamine-lispro (HUMALOG 75/25 MIX) (75-25) 100 UNIT/ML SUSP injection Inject 12 Units into the skin 2 (two) times daily with a meal.    . Insulin Pen Needle 32G X 4 MM MISC Use 4x a day 300 each 3  . losartan (COZAAR) 25 MG tablet TAKE ONE TABLET BY MOUTH DAILY. 90 tablet 3  . metoprolol succinate (TOPROL-XL) 50 MG 24 hr tablet Take 1 tablet (50 mg total) by mouth daily. 90 tablet 3  . Multiple Vitamins-Minerals (MULTIVITAMIN WITH MINERALS) tablet Take 1 tablet by mouth daily.    Marland Kitchen oxyCODONE (OXY IR/ROXICODONE) 5 MG immediate release tablet Take 1 tablet (5 mg total) by mouth every 4 (four) hours as needed for severe pain. 30 tablet 0  . rosuvastatin (CRESTOR) 40 MG tablet TAKE ONE TABLET BY MOUTH AT BEDTIME. 30 tablet 10   No current facility-administered medications on file prior to visit.    No Known  Allergies Family History  Problem Relation Age of Onset  . Coronary artery disease Mother 83       Died age 27, CABG  . CAD Father 68       Died age 78, CABG  . CAD Brother 24       Stents  . Heart failure Maternal Grandmother     PE: There were no vitals taken for this visit. Wt Readings from Last 3 Encounters:  12/31/18 153 lb (69.4 kg)  12/30/18 154 lb (69.9 kg)  12/18/17 152 lb 3.2 oz (69 kg)   Constitutional:  in NAD  The physical exam was not performed (virtual visit).  ASSESSMENT: 1. DM1, insulin-dependent, uncontrolled, with complications - CAD - s/p DES 2017, CABG x1 04/2017 - PN  2. HL  PLAN:  1. Patient with longstanding, uncontrolled, insulin-dependent diabetes, diagnosed as type I at last visit (low C-peptide).  His sugars at home were fluctuating between 50s and 300s at last visit, consistent with insulin deficiency.  We tried to switch his premixed insulin regimen to [place] and rapid acting insulin, but his insurance only approved Toujeo.  We continued Humalog.  He is calculating his Humalog dose based on an insulin to carb ratio, target, and sensitivity factor. At last visit we discussed about limiting processed food intake and include more fruits and vegetables with his meals. -At last visit we discussed that due to his muscle cramps and dehydration, he would probably not be a good candidate for SGLT2 inhibitors, but we can still use a GLP-1 receptor agonist in the future, if covered by his insurance, due to his cardiovascular history. -Since last visit, he developed COVID-19 - mild form - he is feeling well -At this visit, we reviewed his labs from last visit and explained that he has a form of type 1 diabetes.  -As of now, sugars appear to be better in the morning, but he had several lows before breakfast so we will need to either decrease the Toujeo dose or not it in the morning.  Since sugars increase throughout the day, I advised him to stay on the same dose  of Toujeo will move  into the morning.  We may need to split the dose of Toujeo, but I doubt we need to do this, since Toujeo is longer acting.  As of now, we discussed about continuing the same bolus parameters, but I advised him to change his insulin to carb ratio to a more stricter one with starches (see below). - I suggested to:  Please continue: - Toujeo 20 units but move this to am - Humalog: Insulin to carb ratio: 1:12 (but 1:10 with starches: rice, pasta, potatoes, etc). Target: 120 Sensitivity factor: 1:40  Please return in 1.5 months with your sugar log.   - we would check his HbA1c when he presents to the clinic - advised to check sugars at different times of the day - 3-4x a day, rotating check times - advised for yearly eye exams >> he is UTD - return to clinic in 3 months  2. HL - Reviewed latest lipid panel from earlier this year: LDL at goal, HDL slightly low - Continues the statin without side effects.  - time spent with the patient: 25 min, of which >50% was spent in obtaining information about his symptoms, reviewing his previous labs, evaluations, and treatments, counseling him about his newly diagnosed type 1 diabetes (please see the discussed topics above), and developing a plan to reduce the incidence of hypo and hyperglycemia.  Philemon Kingdom, MD PhD [facility] Endocrinology

## 2019-04-01 ENCOUNTER — Encounter: Payer: Self-pay | Admitting: *Deleted

## 2019-04-01 NOTE — Research (Unsigned)
Marland KitchenMarland KitchenMarland KitchenMarland KitchenMarland KitchenMarland KitchenMarland Kitchen   COORDINATE-Diabetes 3 Month Patient Questionnaire (Intervention) Site #:   [id number]   Patient ID:   [id number] MONTH QUESTIONNAIRE  Visit Date 04/01/19   Vital Status [x]   Patient Alive > Proceed to Visit Status []   Patient Dead > Complete Death Form only []   Unknown > Proceed to Visit Status  Visit Status Was the interview completed? []  No >IF NO, Select reason why [] Unable to locate                                    [] No Valid Contacts (patients or alternates) [] Multiple attempts to valid contacts []  Patient no longer cared for at study clinic []  Patient withdrew []  Other, specify:                                           >IF NO, Last date of contact: / /             MM      DD        YYYY    [] Yes >IF YES, Select source of Interview: []   Proxy []   Patient     3 Month Patient Questionnaire (Intervention)  Instructions:   1. Prior to speaking with the patient either over the phone or in person, print off or have available the patient's current list of medications.      IF conducting follow-up visit over the phone - Instruct the patient to gather all their pill bottles. 2. For the purpose of the COORDINATE-Diabetes Study, please document whether the patient is currently taking each of the following medication classes. 3. DO NOT PROMPT DURING FOLLOW-UP VISIT: IF subject mentions reaction to one of the following medications, complete the AE/SAE section and follow instructions in operations manual regarding Adverse event reporting to [employer] ([facility]).  MEDICATIONS  Medication Are you currently taking [Losartan]? If currently taking: If started since last visit: If stopped since last visit:  ACE Inhibitor / Angiotensin Receptor Blocker (ARB) / Angiotensin Receptor Neprilysin inhibitor (ARNi) [] No >  [x] Yes > [] Stopped since last visit [] Never prescribed [x] Started since last visit [] Same medication as last     visit [] Different medication than        last visit [Study personnel to answer]  Documented in EHR? [] No  [] Yes   If no, Verified by: [] Photo of bottle [] Copy of rx [] Dispensing       pharmacy [] Prescribing provider [] Other (specify):                    Date started:        / /             MM DD YYYY Who prescribed this medication for you? [] Cardiology provider > [] Study clinic [] Outside clinic [] Endocrinology provider [] Primary care provider [] Other provider > Specify:                             [] Unknown Date discontinued:        / /             MM DD YYYY Why did you stop taking this medication? (check all that apply) []   Allergic reaction [] Medication side effects [] Unable to       adhere/monitor [] Had an      operation/procedure      that required      stopping it [] Unable to afford it [] No longer wants        to take        this medication [] Provider decision [] Pregnancy [] Other (specify: ) [] Unknown Reason   If started or changed  What medication are you taking now? [] Benazepril (Lotensin) [] Captopril (Capoten) [] Enalapril (Vasotec) [] Fosinopril (Monopril) [] Lisinopril (Zestril, Prinivil) [] Quinapril (Accupril) [] Ramipril (Altace) [] Azilsartan (Edarbi) [] Candesartan (Atacand) [] Irbesartan (Avapro) [] Losartan (Cozaar) [] Olmesartan (Benicar) [] Telmisartan (Micardis) [] Valsartan (Diovan) [] Sacrubitril/Valsartan Delene Loll)      Medication Are you currently taking [Crestor]? If currently taking: If started since last visit: If stopped since last visit:  Statin []  No >  [x] Yes > [] Stopped since last visit [] Never prescribed [] Started since last visit [x] Same medication as last        visit [] Different medication or dose     than last visit [Study personnel to answer]  Documented in EHR? [] No [x] Yes    If no, Verified by: [] Photo of bottle [] Copy of rx [] Dispensing pharmacy [] Prescribing provider [] Other (specify):                    Date started:        / /              MM DD YYYY Who prescribed this medication for you? [] Cardiology provider  [] Study clinic [] Outside clinic [] Endocrinology provider [] Primary care provider [] Other provider  Specify:                             [] Unknown Date discontinued:        / /             MM DD YYYY Why did you stop taking this medication? (check all that apply) [] Allergic reaction [] Medication side effects  [] Muscle aches [] Weakness [] Joint pain [] Cognitive symptoms [] Other (specify):                          [] Unable to        adhere/monitor [] Had an    operation/procedure    that required stopping it [] Unable to afford it [] No longer wants       to take this            medication [] Provider decision [] Pregnancy [] Other (specify: ) [] Unknown Reason   If started or changed  What medication are you taking now? [] Atorvastatin (Lipitor) [] Fluvastatin (Lescol) [] Lovastatin (Mevacor) [] Pravastatin (Pravachol) [] Rosuvastatin (Crestor) [] Simvastatin (Zocor) [] Pitatavastatin (Livalo)  Dose: [] 1 mg  [] 10 mg [] 2 mg  []  20 mg [] 3 mg  []  40 mg [] 4 mg  []  60 mg [] 5 mg  []  80 mg  Frequency: [] Daily []  Less than daily      Medication Are you currently taking [insert name of previous med]? If currently taking: If started since last visit: If stopped since last visit:  SGLT2 Inhibitor [x] No   [] Yes [] Stopped since last visit [] Never prescribed [] Started since last visit [] Same medication as last      visit [] Different medication than last visit [Study personnel to answer]  Documented in EHR? [] No [] Yes   If no, Verified by: [] Photo of bottle [] Copy of rx [] Dispensing       pharmacy [] Prescribing  provider [] Other (specify):                    Date started:        / /             MM DD YYYY Who prescribed this medication for you? [] Cardiology provider  [] Study clinic [] Outside clinic [] Endocrinology provider [] Primary care provider [] Other provider   Specify:                             [] Unknown Date discontinued:        / /             MM DD YYYY Why did you stop taking this medication? (check all that apply) [] Allergic reaction [] Medication side effects [] Unable to       adhere/monitor [] Had an        operation/procedure         that required           stopping it [] Patient unable to       afford it [] Patient no longer       wants to       take this medication [] Provider decision [] Pregnancy [] Other (specify: ) [x] Unknown Reason   If started or changed  What medication are you taking now? [] Canaglifozin (Invokana) [] Dapagliflozin Wilder Glade) [] Empaglifozin (Jardiance) [] Ertugliflozin (Steglatro)     GLP1 Receptor Agonist [x] No   [] Yes [] Stopped since last visit [] Never prescribed [] Started since last visit [] Same medication as last      visit [] Different medication than       last visit [Study personnel to answer]  Documented in EHR? [] No [] Yes   If no, Verified by: [] Photo of bottle [] Copy of rx [] Dispensing       pharmacy [] Prescribing       provider [] Other (specify):                    Date started:        / /             MM DD YYYY Who prescribed this medication for you? [] Cardiology provider  [] Study clinic [] Outside clinic [] Endocrinology provider [] Primary care provider [] Other provider  Specify:                             [] Unknown Date discontinued:        / /             MM DD YYYY Why did you stop taking this medication? (check all that apply) [] Allergic reaction [] Medication side effects [] Unable to      adhere/monitor [] Had an    operation/procedure    that required stopping it [] Patient unable to        afford it [] Patient no longer        wants to take this        medication [] Provider decision [] Pregnancy [] Other (specify: ) [x] Unknown Reason   If started or changed  What medication are you taking now? [] Albiglutide (Tanzeum) [] Dulaglutide  (Trulicity) [] Exanatide (Byetta, Bydureon) [] Liraglutide (Victoza, Saxenda) [] Lixisenatide (Adlyxin) [] Semaglutice (Ozempic)     06/11/2018 (EDC Release)

## 2019-04-10 ENCOUNTER — Other Ambulatory Visit: Payer: Self-pay

## 2019-04-14 ENCOUNTER — Other Ambulatory Visit: Payer: Self-pay

## 2019-04-14 ENCOUNTER — Encounter: Payer: Self-pay | Admitting: Internal Medicine

## 2019-04-14 ENCOUNTER — Ambulatory Visit (INDEPENDENT_AMBULATORY_CARE_PROVIDER_SITE_OTHER): Payer: Commercial Managed Care - PPO | Admitting: Internal Medicine

## 2019-04-14 VITALS — BP 130/60 | HR 90 | Ht 69.0 in | Wt 154.0 lb

## 2019-04-14 DIAGNOSIS — E7800 Pure hypercholesterolemia, unspecified: Secondary | ICD-10-CM

## 2019-04-14 DIAGNOSIS — E1059 Type 1 diabetes mellitus with other circulatory complications: Secondary | ICD-10-CM

## 2019-04-14 LAB — POCT GLYCOSYLATED HEMOGLOBIN (HGB A1C): Hemoglobin A1C: 8.1 % — AB (ref 4.0–5.6)

## 2019-04-14 MED ORDER — FREESTYLE LIBRE 2 READER SYSTM DEVI: 1.0000 | Freq: Once | 0 refills | Status: AC

## 2019-04-14 MED ORDER — FREESTYLE LIBRE 2 SENSOR SYSTM MISC: 1.0000 | 3 refills | Status: DC

## 2019-04-14 MED ORDER — INSULIN PEN NEEDLE 32G X 4 MM MISC: 3 refills | Status: DC

## 2019-04-14 NOTE — Progress Notes (Signed)
Patient ID: Kelly Hardy, male   DOB: 1960-12-04, 59 y.o.   MRN: [id number]   This visit occurred during the SARS-CoV-2 public health emergency.  Safety protocols were in place, including screening questions prior to the visit, additional usage of staff PPE, and extensive cleaning of exam room while observing appropriate contact time as indicated for disinfecting solutions.   HPI: Kelly Hardy is a 59 y.o.-year-old male, referred by his cardiologist, Dr. Percival Spanish, for management of DM type I, initially dx in 1993 as DM type II, insulin-dependent since 1994, uncontrolled, with complications (CAD - s/p DES 2017, CABG x1 2019; PN). He saw Dr. Forde Dandy until few years ago.  Last visit with me 1.5 months ago (virtual).  He had COVID-19 before last visit.  He recovered well.  He had more dietary indiscretions over the holidays and sugars are higher.  Also, he is not always bolusing before meals but many times after meals.  Reviewed his HbA1c levels: 10/11/2018: HbA1c 8.3% 10/19/2017: HbA1c 8.5% Lab Results  Component Value Date   HGBA1C 8.5 (H) 04/02/2017   Pt was on a regimen of: - Humalog 75/25 15 units 2x a day before meals - Humalog 2-8 units 3x a day, before meals - per sliding scale: ISF 50, target 120, ICR ~1:20  At last visit, we changed to: - Toujeo 20 units at bedtime Tyler Aas was not covered) >> in am - Humalog: Insulin to carb ratio: 1:12, but 1:10 with starches Target: 120 Sensitivity factor: 1:40  Pt checks his sugars 5-7 times a day: - am: 115, 150-260, 311 >> 45, 54-131, 201, 228 >> 39 (03/02/2019), [id number] - 2h after b'fast: n/c >> 78, 144-351 - before lunch: 115-250 >> 97-225, 241 >> 66-321 - 2h after lunch: n/c >> 50-290 - before dinner: 130-160 >> 208-240 >> 75-393 - 2h after dinner: n/c >> 104-319 - bedtime: 130-160, 260 >> 160-200 >> 69, 70, 95-310, 310 - nighttime: 52-60 x3 in last mo >> 76 >> n/c Lowest sugar was 52 >> 45 >> 39; he has hypoglycemia  awareness in the 70s.  Highest sugar was 311 >> 300s >> 393.  Glucometer: One Touch Verio  Pt's meals are: - Breakfast: egg sandwich or whole wheat bread + mayo, coffee - Lunch: microwaveable lunch, chicken sandwich, PB crackers or something else light - Dinner: sandwich with mayo + lean meat: [place] or ham, no veggies usually - Snacks: crackers, almonds at night He is walking for exercise.  She tried more intense exercise but had severe muscle aches.  -No CKD, last BUN/creatinine:  10/11/2018: 13/0.94, GFR 89, glucose 271, ACR 8 10/19/2017: 16/0.92, glucose 302, ACR <3.7 Lab Results  Component Value Date   BUN 10 04/07/2017   BUN 10 04/06/2017   CREATININE 0.80 04/07/2017   CREATININE 0.77 04/06/2017  On Cozaar 25.  -+ HL, last lipid panel: 10/11/2018: 106/41/36/62 10/19/2017: 112/46/41/62 No results found for: CHOL, HDL, LDLCALC, LDLDIRECT, TRIG, CHOLHDL On Crestor 40.  - last eye exam was in 09/2018: No DR  -He has numbness and tingling -on plantar aspect of his feet-under his toes.  + Occasional leg cramps  On ASA 81.  He also has a history of HTN.  Pt has FH of DM in daughter - type 1 - dx. At 15 y/o. Patient's wife had breast cancer.  ROS: Constitutional: no weight gain/no weight loss, no fatigue, no subjective hyperthermia, no subjective hypothermia Eyes: no blurry vision, no xerophthalmia ENT: no sore throat, no nodules palpated in  neck, no dysphagia, no odynophagia, no hoarseness Cardiovascular: no CP/no SOB/+ palpitations/no leg swelling Respiratory: no cough/no SOB/no wheezing Gastrointestinal: no N/no V/no D/no C/no acid reflux Musculoskeletal: no muscle aches/no joint aches Skin: no rashes, no hair loss Neurological: no tremors/no numbness/no tingling/no dizziness  I reviewed pt's medications, allergies, PMH, social hx, family hx, and changes were documented in the history of present illness. Otherwise, unchanged from my initial visit note.  Past Medical  History:  Diagnosis Date  . Coronary artery disease   . Diabetes mellitus without complication (HCC)    Type I  . GERD (gastroesophageal reflux disease)   . Hyperlipidemia   . Hypertension   . Palpitations    eval 2014   Past Surgical History:  Procedure Laterality Date  . CARDIAC CATHETERIZATION N/A 02/10/2016   Procedure: Left Heart Cath and Coronary Angiography;  Surgeon: [name], MD;  Location: [facility];  Service: Cardiovascular;  Laterality: N/A;  . CARDIAC CATHETERIZATION N/A 02/10/2016   Procedure: Coronary Stent Intervention;  Surgeon: [name], MD;  Location: [facility];  Service: Cardiovascular;  Laterality: N/A;  . CORONARY ARTERY BYPASS GRAFT N/A 04/04/2017   Procedure: CORONARY ARTERY BYPASS GRAFTING (CABG), times one, using left internal mammary artery. Off pump;  Surgeon: Melrose Nakayama, MD;  Location: [facility];  Service: Open Heart Surgery;  Laterality: N/A;  . CORONARY STENT PLACEMENT  02/10/2016   STENT XIENCE ALPINE RX 3.25X15 drug eluting stent was successfully placed  . LEFT HEART CATH AND CORONARY ANGIOGRAPHY N/A 03/23/2017   Procedure: LEFT HEART CATH AND CORONARY ANGIOGRAPHY;  Surgeon: Martinique, Peter M, MD;  Location: [facility];  Service: Cardiovascular;  Laterality: N/A;  . SALIVARY GLAND SURGERY Left    benign  . TEE WITHOUT CARDIOVERSION N/A 04/04/2017   Procedure: TRANSESOPHAGEAL ECHOCARDIOGRAM (TEE);  Surgeon: Melrose Nakayama, MD;  Location: [facility];  Service: Open Heart Surgery;  Laterality: N/A;   Social History   Socioeconomic History  . Marital status: Married    Spouse name: Not on file  . Number of children: 2  . Years of education: Not on file  . Highest education level: Not on file  Occupational History  . Occupation: Freight forwarder at [employer]   Social Needs  . Financial resource strain: Not on file  . Food insecurity    Worry: Not on file    Inability: Not on file  . Transportation needs     Medical: Not on file    Non-medical: Not on file  Tobacco Use  . Smoking status: Never Smoker  . Smokeless tobacco: Never Used  Substance and Sexual Activity  . Alcohol use: No  . Drug use: No   Current Outpatient Medications on File Prior to Visit  Medication Sig Dispense Refill  . acetaminophen (TYLENOL) 500 MG tablet Take 2 tablets (1,000 mg total) by mouth every 8 (eight) hours as needed. 30 tablet 0  . aspirin EC 81 MG tablet Take 81 mg by mouth daily.    . Glucagon 3 MG/DOSE POWD Place 3 mg into the nose once as needed for up to 1 dose. 1 each 11  . Insulin Glargine, 2 Unit Dial, (TOUJEO MAX SOLOSTAR) 300 UNIT/ML SOPN Inject 18-20 Units into the skin at bedtime. 5 pen 2  . insulin lispro (HUMALOG KWIKPEN) 100 UNIT/ML KwikPen Inject 0.02-0.1 mLs (2-10 Units total) into the skin 3 (three) times daily. 15 mL 3  . insulin lispro protamine-lispro (HUMALOG  75/25 MIX) (75-25) 100 UNIT/ML SUSP injection Inject 12 Units into the skin 2 (two) times daily with a meal.    . Insulin Pen Needle 32G X 4 MM MISC Use 4-6x a day 400 each 3  . losartan (COZAAR) 25 MG tablet TAKE ONE TABLET BY MOUTH DAILY. 90 tablet 3  . metoprolol succinate (TOPROL-XL) 50 MG 24 hr tablet Take 1 tablet (50 mg total) by mouth daily. 90 tablet 3  . Multiple Vitamins-Minerals (MULTIVITAMIN WITH MINERALS) tablet Take 1 tablet by mouth daily.    Marland Kitchen oxyCODONE (OXY IR/ROXICODONE) 5 MG immediate release tablet Take 1 tablet (5 mg total) by mouth every 4 (four) hours as needed for severe pain. 30 tablet 0  . rosuvastatin (CRESTOR) 40 MG tablet TAKE ONE TABLET BY MOUTH AT BEDTIME. 30 tablet 10   No current facility-administered medications on file prior to visit.   No Known Allergies Family History  Problem Relation Age of Onset  . Coronary artery disease Mother 33       Died age 8, CABG  . CAD Father 13       Died age 79, CABG  . CAD Brother 66       Stents  . Heart failure Maternal Grandmother     PE: BP 130/60    Pulse 90   Ht 5\' 9"  (1.753 m)   Wt 154 lb (69.9 kg)   SpO2 99%   BMI 22.74 kg/m  Wt Readings from Last 3 Encounters:  04/14/19 154 lb (69.9 kg)  12/31/18 153 lb (69.4 kg)  12/30/18 154 lb (69.9 kg)   Constitutional: normal weight, in NAD Eyes: PERRLA, EOMI, no exophthalmos ENT: moist mucous membranes, no thyromegaly, no cervical lymphadenopathy Cardiovascular: RRR, No MRG Respiratory: CTA B Gastrointestinal: abdomen soft, NT, ND, BS+ Musculoskeletal: no deformities, strength intact in all 4 Skin: moist, warm, no rashes Neurological: no tremor with outstretched hands, DTR normal in all 4  ASSESSMENT: 1. DM1, insulin-dependent, uncontrolled, with complications - CAD - s/p DES 2017, CABG x1 04/2017 - PN  We diagnosed type 1 diabetes based on the low C-peptide suggesting insulin deficiency: Component     Latest Ref Rng & Units 12/31/2018  ZNT8 Antibodies     U/mL <15  Glutamic Acid Decarb Ab     <5 IU/mL <5  C-Peptide     0.80 - 3.85 ng/mL <0.10 (L)  Glucose, Plasma     65 - 99 mg/dL 94  POC Glucose     70 - 99 mg/dl 132 (A)  Islet Cell Ab     Neg:<1:1 Negative    2. HL  PLAN:  1. Patient with longstanding, uncontrolled, insulin diabetes, which we diagnosed as type I due to low C-peptide.  His sugars at home were fluctuating between 50s and 300s in the past, consistent with insulin deficiency.  We switched his premixed insulin regimen to long-acting and rapid acting insulin. Tyler Aas was not covered by his insurance so he is now on [employer].  He is doing a good job calculating his Humalog dose based on insulin carb ratio, target, and sensitivity factor.   -At last visit, sugars are better in the morning but he had several lows before breakfast and we decided to move the Toujeo to morning.  We discussed at last visit we may need to to split Toujeo, but I would like to avoid that if possible.  Since sugars were increasing throughout the day we also discussed about strengthening  his insulin to carb  ratio with starchy meals.   -At last visit, she was not a good candidate for SGLT2 inhibitors due to muscle cramps and dehydration after his COVID-19 infection.  It is possible that we would be able to use a GLP-1 receptor agonist in the future, if covered by his insurance, due to his cardiovascular history -At this visit, sugars are very fluctuating, with the lowest level being at 39 mg near what happened but he feels that he overexerted himself the day before.  He has high blood sugars in the upper 300s and he mentions that these are usually checked after meals for which he forgot to bolus.  Many times he remembers to bolus after he eats, when sugars are very high.  He then takes the insulin and sugars dropped, sometimes low.  He is aware that he needs to inject the insulin 15 minutes before each meal and he is determined to start doing his best with this.  Also, he relaxed his diet during the holidays and he is trying to get back to his previous diet.  He is not taking his meals to work and we discussed about trying to do so and not relying on food at his work. -At this visit, due to the high variability in his blood sugars (however, no lows in the last 2 weeks), I would not change his regimen -I suggested the freestyle libre 2 CGM, to give him alerts and avoid missing significant low blood sugars - I suggested to:  Patient Instructions  Please continue: - Toujeo 20 units in a.m. - Humalog: Insulin to carb ratio:  - 1:12  - 1:10 with starches: rice, pasta, potatoes, etc). Target: 120 Sensitivity factor: 1:40  Please do the following approximately 15 minutes before every meal: - count carbs (C) - check sugars (S) - inject insulin (I)  Also, try to get the CGM.  Please return in 3 months with your sugar log.   - HbA1c today: 8.1% (slightly better) - advised to check sugars at different times of the day - 3-4x a day, rotating check times - advised for yearly eye exams  >> he is UTD - return to clinic in 3 months  2. HL - Reviewed latest lipid panel from 2020: LDL is at goal of <70, HDL is slightly low - Continues the statin without side effects.   Philemon Kingdom, MD PhD [facility] Endocrinology

## 2019-04-14 NOTE — Patient Instructions (Addendum)
Please continue: - Toujeo 20 units in a.m. - Humalog: Insulin to carb ratio:  - 1:12  - 1:10 with starches: rice, pasta, potatoes, etc). Target: 120 Sensitivity factor: 1:40  Please do the following approximately 15 minutes before every meal: - count carbs (C) - check sugars (S) - inject insulin (I)  Also, try to get the CGM.  Please return in 3 months with your sugar log.

## 2019-04-14 NOTE — Addendum Note (Signed)
Addended by: Cardell Peach I on: 04/14/2019 09:52 AM   Modules accepted: Orders

## 2019-05-12 ENCOUNTER — Other Ambulatory Visit: Payer: Self-pay | Admitting: Internal Medicine

## 2019-05-19 ENCOUNTER — Other Ambulatory Visit: Payer: Self-pay | Admitting: Physician Assistant

## 2019-05-21 ENCOUNTER — Encounter: Payer: Self-pay | Admitting: *Deleted

## 2019-05-21 DIAGNOSIS — Z006 Encounter for examination for normal comparison and control in clinical research program: Secondary | ICD-10-CM

## 2019-05-21 NOTE — Research (Deleted)
Marland KitchenMarland KitchenMarland KitchenMarland KitchenMarland KitchenMarland KitchenMarland Kitchen   COORDINATE-Diabetes 9 Month Patient Questionnaire (Intervention) Site #:   [id number]   Patient ID:     [id number] QUESTIONNAIRE  Visit Date 05/21/19   Vital Status [x]   Patient Alive > Proceed to Visit Status []   Patient Dead > Complete Death Form only []   Unknown > Proceed to Visit Status  Visit Status Was the interview completed? []  No >IF NO, Select reason why [] Unable to locate                                    [] No Valid Contacts (patients or alternates) [] Multiple attempts to valid contacts []  Patient no longer cared for at study clinic []  Patient withdrew []  Other, specify:                                           >IF NO, Last date of contact: / /             MM      DD        YYYY    [] Yes >IF YES, Select source of Interview: []   Proxy []   Patient     9 Month Patient Questionnaire (Intervention)  Instructions:   1. Prior to speaking with the patient either over the phone or in person, print off or have available the patient's current list of medications.      IF conducting follow-up visit over the phone - Instruct the patient to gather all their pill bottles. 2. For the purpose of the COORDINATE-Diabetes Study, please document whether the patient is currently taking each of the following medication classes. 3. DO NOT PROMPT DURING FOLLOW-UP VISIT: IF subject mentions reaction to one of the following medications, complete the AE/SAE section and follow instructions in operations manual regarding Adverse event reporting to [employer] ([facility]).  MEDICATIONS  Medication Are you currently taking [insert name of previous med]? If currently taking: If started since last visit: If stopped since last visit:  ACE Inhibitor / Angiotensin Receptor Blocker (ARB) / Angiotensin Receptor Neprilysin inhibitor (ARNi) [] No >  [x] Yes > [] Stopped since last visit [] Never prescribed [] Started since last visit [x] Same medication as last     visit [] Different  medication than       last visit [Study personnel to answer]  Documented in EHR? [] No  [x] Yes   If no, Verified by: [] Photo of bottle [] Copy of rx [] Dispensing       pharmacy [] Prescribing provider [] Other (specify):                    Date started:        / /             MM DD YYYY Who prescribed this medication for you? [] Cardiology provider > [] Study clinic [] Outside clinic [] Endocrinology provider [] Primary care provider [] Other provider > Specify:                             [] Unknown Date discontinued:        / /             MM DD YYYY Why did you stop taking this medication? (  check all that apply) [] Allergic reaction [] Medication side effects [] Unable to       adhere/monitor [] Had an      operation/procedure      that required      stopping it [] Unable to afford it [] No longer wants        to take        this medication [] Provider decision [] Pregnancy [] Other (specify: ) [] Unknown Reason   If started or changed  What medication are you taking now? [] Benazepril (Lotensin) [] Captopril (Capoten) [] Enalapril (Vasotec) [] Fosinopril (Monopril) [] Lisinopril (Zestril, Prinivil) [] Quinapril (Accupril) [] Ramipril (Altace) [] Azilsartan (Edarbi) [] Candesartan (Atacand) [] Irbesartan (Avapro) [] Losartan (Cozaar) [] Olmesartan (Benicar) [] Telmisartan (Micardis) [] Valsartan (Diovan) [] Sacrubitril/Valsartan Delene Loll)      Medication Are you currently taking [insert name of previous med]? If currently taking: If started since last visit: If stopped since last visit:  Statin []  No >  [x] Yes > [] Stopped since last visit [] Never prescribed [] Started since last visit [x] Same medication as last        visit [] Different medication or dose     than last visit [Study personnel to answer]  Documented in EHR? [] No [x] Yes    If no, Verified by: [] Photo of bottle [] Copy of rx [] Dispensing pharmacy [] Prescribing provider [] Other (specify):                     Date started:        / /             MM DD YYYY Who prescribed this medication for you? [] Cardiology provider  [] Study clinic [] Outside clinic [] Endocrinology provider [] Primary care provider [] Other provider  Specify:                             [] Unknown Date discontinued:        / /             MM DD YYYY Why did you stop taking this medication? (check all that apply) [] Allergic reaction [] Medication side effects  [] Muscle aches [] Weakness [] Joint pain [] Cognitive symptoms [] Other (specify):                          [] Unable to        adhere/monitor [] Had an    operation/procedure    that required stopping it [] Unable to afford it [] No longer wants       to take this            medication [] Provider decision [] Pregnancy [] Other (specify: ) [] Unknown Reason   If started or changed  What medication are you taking now? [] Atorvastatin (Lipitor) [] Fluvastatin (Lescol) [] Lovastatin (Mevacor) [] Pravastatin (Pravachol) [] Rosuvastatin (Crestor) [] Simvastatin (Zocor) [] Pitatavastatin (Livalo)  Dose: [] 1 mg  [] 10 mg [] 2 mg  []  20 mg [] 3 mg  []  40 mg [] 4 mg  []  60 mg [] 5 mg  []  80 mg  Frequency: [] Daily []  Less than daily      Medication Are you currently taking [insert name of previous med]? If currently taking: If started since last visit: If stopped since last visit:  SGLT2 Inhibitor [x] No   [] Yes [] Stopped since last visit [] Never prescribed [] Started since last visit [] Same medication as last      visit [] Different medication than last visit [Study personnel to answer]  Documented in EHR? [] No [] Yes   If no, Verified by: [] Photo of bottle [] Copy of rx [] Dispensing  pharmacy [] Prescribing       provider [] Other (specify):                    Date started:        / /             MM DD YYYY Who prescribed this medication for you? [] Cardiology provider  [] Study clinic [] Outside clinic [] Endocrinology  provider [] Primary care provider [] Other provider  Specify:                             [] Unknown Date discontinued:        / /             MM DD YYYY Why did you stop taking this medication? (check all that apply) [] Allergic reaction [] Medication side effects [] Unable to       adhere/monitor [] Had an        operation/procedure         that required           stopping it [] Patient unable to       afford it [] Patient no longer       wants to       take this medication [] Provider decision [] Pregnancy [] Other (specify: ) [] Unknown Reason   If started or changed  What medication are you taking now? [] Canaglifozin (Invokana) [] Dapagliflozin (Farxiga) [] Empaglifozin (Jardiance) [] Ertugliflozin (Steglatro)     GLP1 Receptor Agonist [x] No   [] Yes [] Stopped since last visit [] Never prescribed [] Started since last visit [] Same medication as last      visit [] Different medication than       last visit [Study personnel to answer]  Documented in EHR? [] No [] Yes   If no, Verified by: [] Photo of bottle [] Copy of rx [] Dispensing       pharmacy [] Prescribing       provider [] Other (specify):                    Date started:        / /             MM DD YYYY Who prescribed this medication for you? [] Cardiology provider  [] Study clinic [] Outside clinic [] Endocrinology provider [] Primary care provider [] Other provider  Specify:                             [] Unknown Date discontinued:        / /             MM DD YYYY Why did you stop taking this medication? (check all that apply) [] Allergic reaction [] Medication side effects [] Unable to      adhere/monitor [] Had an    operation/procedure    that required stopping it [] Patient unable to        afford it [] Patient no longer        wants to take this        medication [] Provider decision [] Pregnancy [] Other (specify: ) [] Unknown Reason   If started or changed  What medication are you taking  now? [] Albiglutide (Tanzeum) [] Dulaglutide (Trulicity) [] Exanatide (Byetta, Bydureon) [] Liraglutide (Victoza, Saxenda) [] Lixisenatide (Adlyxin) [] Semaglutice (Ozempic)     06/11/2018 ([facility] Release)

## 2019-05-26 ENCOUNTER — Other Ambulatory Visit: Payer: Self-pay | Admitting: Dermatology

## 2019-06-02 ENCOUNTER — Telehealth: Payer: Self-pay | Admitting: Internal Medicine

## 2019-06-02 MED ORDER — INSULIN LISPRO (1 UNIT DIAL) 100 UNIT/ML (KWIKPEN): 2.0000 [IU] | PEN_INJECTOR | Freq: Three times a day (TID) | SUBCUTANEOUS | 3 refills | Status: DC

## 2019-06-02 NOTE — Telephone Encounter (Signed)
This patient has never been seen at this clinic or by Dr. Elvera Lennox. FYI.

## 2019-06-02 NOTE — Telephone Encounter (Signed)
Thank you Efraim Kaufmann   This was entered in error.

## 2019-06-02 NOTE — Telephone Encounter (Signed)
MEDICATION: Humalog qwikpen  PHARMACY:  [facility] [place]  IS THIS A 90 DAY SUPPLY : y  IS PATIENT OUT OF MEDICATION: no  IF NOT; HOW MUCH IS LEFT: has enough to last him the day  LAST APPOINTMENT DATE: @Visit date not found  NEXT APPOINTMENT DATE:@Visit date not found  DO WE HAVE YOUR PERMISSION TO LEAVE A DETAILED MESSAGE:  OTHER COMMENTS:  New prescription for our provider-historical rx he has been on from his last MD  **Let patient know to contact pharmacy at the end of the day to make sure medication is ready. **  ** Please notify patient to allow 48-72 hours to process**  **Encourage patient to contact the pharmacy for refills or they can request refills through [facility]** 

## 2019-06-02 NOTE — Telephone Encounter (Signed)
RX sent

## 2019-06-02 NOTE — Addendum Note (Signed)
Addended by: Cardell Peach I on: 06/02/2019 11:50 AM   Modules accepted: Orders

## 2019-06-02 NOTE — Telephone Encounter (Signed)
MEDICATION: Humalog qwikpen  PHARMACY:  [facility] in [place]  IS THIS A 90 DAY SUPPLY : y  IS PATIENT OUT OF MEDICATION: no  IF NOT; HOW MUCH IS LEFT: has enough to last him the day  LAST APPOINTMENT DATE: @Visit  date not found  NEXT APPOINTMENT DATE:@Visit  date not found  DO WE HAVE YOUR PERMISSION TO LEAVE A DETAILED MESSAGE:  OTHER COMMENTS:  New prescription for our provider-historical rx he has been on from his last MD  **Let patient know to contact pharmacy at the end of the day to make sure medication is ready. **  ** Please notify patient to allow 48-72 hours to process**  **Encourage patient to contact the pharmacy for refills or they can request refills through [facility]**

## 2019-06-14 ENCOUNTER — Other Ambulatory Visit: Payer: Self-pay | Admitting: Cardiology

## 2019-07-10 ENCOUNTER — Encounter: Payer: Self-pay | Admitting: *Deleted

## 2019-07-10 DIAGNOSIS — Z006 Encounter for examination for normal comparison and control in clinical research program: Secondary | ICD-10-CM

## 2019-07-17 DIAGNOSIS — Z7189 Other specified counseling: Secondary | ICD-10-CM | POA: Insufficient documentation

## 2019-07-17 NOTE — Progress Notes (Signed)
Cardiology Office Note   Date:  07/18/2019   ID:  Kelly Hardy, DOB [birth year], MRN [id number]  PCP:  [name], MD  Cardiologist:   [name], MD    Chief Complaint  Patient presents with  . Coronary Artery Disease     History of Present Illness: 59 y/o Caucasian male with complaints of exertional chest pain 02/01/16. OP stress Myoview done 02/09/16 showed diaphragmatic attenuation with 46mm inferior ST depression and chest pain. He was admitted the next day to [facility] for OP cath which revealed a 95% mRCA. He recieved a DES with good results.  I saw him in follow up and he had increased angina and was admitted for cath.  This demonstrated dLM-ostial LAD 90% stenosis and 45% mLAD stenosis. The RCA stent was patent. He was discharged ad re admitted Jan 2nd 2019 for elective CABG x 1 with an LIMA-LAD. He tolerated this well. EF was 55-60%.  He returns for follow up.    Since I last saw him he has done well.  He does get some palpitations at night but these have not been particularly problematic.  He does not really notice them during the day and he does not have any presyncope or syncope.  He had Covid in November.  He denies any chest discomfort, neck or arm discomfort.  He has had no new shortness of breath, PND or orthopnea.  He still has an active physical job although he is not working as many hours.  He is cut back a little bit.  Past Medical History:  Diagnosis Date  . Coronary artery disease   . Diabetes mellitus without complication (HCC)    Type I  . GERD (gastroesophageal reflux disease)   . Hyperlipidemia   . Hypertension   . Palpitations    eval 2014    Past Surgical History:  Procedure Laterality Date  . CARDIAC CATHETERIZATION N/A 02/10/2016   Procedure: Left Heart Cath and Coronary Angiography;  Surgeon: [name], MD;  Location: [place] [facility];  Service: Cardiovascular;  Laterality: N/A;  . CARDIAC CATHETERIZATION N/A 02/10/2016   Procedure:  Coronary Stent Intervention;  Surgeon: [name], MD;  Location: [place] [facility];  Service: Cardiovascular;  Laterality: N/A;  . CORONARY ARTERY BYPASS GRAFT N/A 04/04/2017   Procedure: CORONARY ARTERY BYPASS GRAFTING (CABG), times one, using left internal mammary artery. Off pump;  Surgeon: Melrose Nakayama, MD;  Location: [place];  Service: Open Heart Surgery;  Laterality: N/A;  . CORONARY STENT PLACEMENT  02/10/2016   STENT XIENCE ALPINE RX [id number] drug eluting stent was successfully placed  . LEFT HEART CATH AND CORONARY ANGIOGRAPHY N/A 03/23/2017   Procedure: LEFT HEART CATH AND CORONARY ANGIOGRAPHY;  Surgeon: Martinique, Peter M, MD;  Location: [place] [facility];  Service: Cardiovascular;  Laterality: N/A;  . SALIVARY GLAND SURGERY Left    benign  . TEE WITHOUT CARDIOVERSION N/A 04/04/2017   Procedure: TRANSESOPHAGEAL ECHOCARDIOGRAM (TEE);  Surgeon: Melrose Nakayama, MD;  Location: [place];  Service: Open Heart Surgery;  Laterality: N/A;     Current Outpatient Medications  Medication Sig Dispense Refill  . acetaminophen (TYLENOL) 500 MG tablet Take 2 tablets (1,000 mg total) by mouth every 8 (eight) hours as needed. 30 tablet 0  . aspirin EC 81 MG tablet Take 81 mg by mouth daily.    . Continuous Blood Gluc Sensor (FREESTYLE LIBRE 2 SENSOR SYSTM) MISC 1 each by Does not apply  route every 14 (fourteen) days. 6 each 3  . Glucagon 3 MG/DOSE POWD Place 3 mg into the nose once as needed for up to 1 dose. 1 each 11  . insulin lispro (HUMALOG KWIKPEN) 100 UNIT/ML KwikPen Inject 0.02-0.1 mLs (2-10 Units total) into the skin 3 (three) times daily. 15 mL 3  . Insulin Pen Needle 32G X 4 MM MISC Use 4-6x a day 400 each 3  . losartan (COZAAR) 25 MG tablet TAKE ONE TABLET BY MOUTH DAILY. 90 tablet 3  . metoprolol succinate (TOPROL-XL) 50 MG 24 hr tablet Take 1 tablet (50 mg total) by mouth daily. 90 tablet 3  . Multiple Vitamins-Minerals (MULTIVITAMIN WITH MINERALS) tablet Take 1 tablet by  mouth daily.    Marland Kitchen oxyCODONE (OXY IR/ROXICODONE) 5 MG immediate release tablet Take 1 tablet (5 mg total) by mouth every 4 (four) hours as needed for severe pain. 30 tablet 0  . rosuvastatin (CRESTOR) 40 MG tablet TAKE ONE TABLET BY MOUTH AT BEDTIME. 30 tablet 10  . TOUJEO SOLOSTAR 300 UNIT/ML SOPN INJECT 18-20 UNITS INTO SKIN AT BEDTIME (BOX MUST LAST 66 DAYS PER INSURANCE) 4.5 mL 2   No current facility-administered medications for this visit.    Allergies:   Patient has no known allergies.    ROS:  Please see the history of present illness.   Otherwise, review of systems are positive for none.   All other systems are reviewed and negative.    PHYSICAL EXAM: VS:  BP (!) 146/72   Pulse 73   Ht 5\' 9"  (1.753 m)   Wt 155 lb 3.2 oz (70.4 kg)   SpO2 98%   BMI 22.92 kg/m  , BMI Body mass index is 22.92 kg/m.  GENERAL:  Well appearing NECK:  No jugular venous distention, waveform within normal limits, carotid upstroke brisk and symmetric, no bruits, no thyromegaly LUNGS:  Clear to auscultation bilaterally CHEST:  Well healed sternotomy scar. HEART:  PMI not displaced or sustained,S1 and S2 within normal limits, no S3, no S4, no clicks, no rubs, no murmurs ABD:  Flat, positive bowel sounds normal in frequency in pitch, no bruits, no rebound, no guarding, no midline pulsatile mass, no hepatomegaly, no splenomegaly EXT:  2 plus pulses throughout, no edema, no cyanosis no clubbing   EKG:  EKG is   ordered today. Sinus rhythm, rate 73 , axis within normal limits, intervals within normal limits, no acute ST-T wave changes.  Recent Labs: No results found for requested labs within last 8760 hours.     Lab Results  Component Value Date   HGBA1C 7.8 (A) 07/18/2019    Wt Readings from Last 3 Encounters:  07/18/19 155 lb 3.2 oz (70.4 kg)  07/18/19 152 lb (68.9 kg)  04/14/19 154 lb (69.9 kg)     Other studies Reviewed: Additional studies/ records that were reviewed today include:  Labs Review of the above records demonstrates:  See elsewhere   ASSESSMENT AND PLAN:  CABG:     The patient has no new sypmtoms.  No further cardiovascular testing is indicated.  We will continue with aggressive risk reduction and meds as listed.  DYSLIPIDEMIA:    LDL was 62.  No change in therapy.   HTN:    The blood pressure is slightly elevated but he says it is always 120s over 70s at home.  No change in therapy.  DM:  A1c was down below 8 which is the first time and he is being followed  by Dr. Cruzita Lederer.  COVID EDUCATION: He has some vaccine hesitancy and we spent a long time talking about this today.  I suspect he will go and get vaccinated.    Current medicines are reviewed at length with the patient today.  The patient does not have concerns regarding medicines.  The following changes have been made: As above  Labs/ tests ordered today include:   None  Orders Placed This Encounter  Procedures  . EKG 12-Lead     Disposition:   Follow up with me in 12 months  Signed, [name], MD  07/18/2019 11:37 AM    [place]

## 2019-07-18 ENCOUNTER — Encounter: Payer: Self-pay | Admitting: Cardiology

## 2019-07-18 ENCOUNTER — Ambulatory Visit (INDEPENDENT_AMBULATORY_CARE_PROVIDER_SITE_OTHER): Payer: Commercial Managed Care - PPO | Admitting: Cardiology

## 2019-07-18 ENCOUNTER — Other Ambulatory Visit: Payer: Self-pay

## 2019-07-18 ENCOUNTER — Ambulatory Visit (INDEPENDENT_AMBULATORY_CARE_PROVIDER_SITE_OTHER): Payer: Commercial Managed Care - PPO | Admitting: Internal Medicine

## 2019-07-18 ENCOUNTER — Encounter: Payer: Self-pay | Admitting: Internal Medicine

## 2019-07-18 VITALS — BP 128/80 | HR 76 | Ht 69.0 in | Wt 152.0 lb

## 2019-07-18 VITALS — BP 146/72 | HR 73 | Ht 69.0 in | Wt 155.2 lb

## 2019-07-18 DIAGNOSIS — E7800 Pure hypercholesterolemia, unspecified: Secondary | ICD-10-CM

## 2019-07-18 DIAGNOSIS — Z951 Presence of aortocoronary bypass graft: Secondary | ICD-10-CM

## 2019-07-18 DIAGNOSIS — Z7189 Other specified counseling: Secondary | ICD-10-CM

## 2019-07-18 DIAGNOSIS — E785 Hyperlipidemia, unspecified: Secondary | ICD-10-CM

## 2019-07-18 DIAGNOSIS — I10 Essential (primary) hypertension: Secondary | ICD-10-CM | POA: Diagnosis not present

## 2019-07-18 DIAGNOSIS — E1059 Type 1 diabetes mellitus with other circulatory complications: Secondary | ICD-10-CM

## 2019-07-18 DIAGNOSIS — E118 Type 2 diabetes mellitus with unspecified complications: Secondary | ICD-10-CM | POA: Diagnosis not present

## 2019-07-18 DIAGNOSIS — Z794 Long term (current) use of insulin: Secondary | ICD-10-CM

## 2019-07-18 LAB — POCT GLYCOSYLATED HEMOGLOBIN (HGB A1C): Hemoglobin A1C: 7.8 % — AB (ref 4.0–5.6)

## 2019-07-18 NOTE — Patient Instructions (Signed)
Medication Instructions:  NO CHANGES *If you need a refill on your cardiac medications before your next appointment, please call your pharmacy*  Lab Work: NONE ORDERED THIS VISIT  Testing/Procedures: NONE ORDERED THIS VISIT  Follow-Up: At [facility], you and your health needs are our priority.  As part of our continuing mission to provide you with exceptional heart care, we have created designated Provider Care Teams.  These Care Teams include your primary Cardiologist (physician) and Advanced Practice Providers (APPs -  Physician Assistants and Nurse Practitioners) who all work together to provide you with the care you need, when you need it.  Your next appointment:   1 year(s)  You will receive a reminder letter in the mail two months in advance. If you don't receive a letter, please call our office to schedule the follow-up appointment.  The format for your next appointment:   In Person  Provider:   [name], MD

## 2019-07-18 NOTE — Patient Instructions (Addendum)
Please decrease: - Toujeo to 18 units in a.m.  Change: - Humalog: Insulin to carb ratio:  - 1:10 - 1:8 with starches: rice, pasta, potatoes, etc. Target: 120 Sensitivity factor: 1:40  Please do the following approximately 15 minutes before every meal: - count carbs (C) - check sugars (S) - inject insulin (I)  Please return in 3-4 months with your sugar log.

## 2019-07-18 NOTE — Progress Notes (Signed)
Patient ID: Kelly Hardy, male   DOB: [birth year], 59 y.o.   MRN: [id number]   This visit occurred during the SARS-CoV-2 public health emergency.  Safety protocols were in place, including screening questions prior to the visit, additional usage of staff PPE, and extensive cleaning of exam room while observing appropriate contact time as indicated for disinfecting solutions.   HPI: Kelly Hardy is a 59 y.o.-year-old male, referred by his cardiologist, Dr. Percival Spanish, for management of DM type I, initially dx in 1993 as DM type II, insulin-dependent since 1994, uncontrolled, with complications (CAD - s/p DES 2017, CABG x1 2019; PN). He saw Dr. Forde Dandy until few years ago.  Last visit with me 3 months ago.  Since last visit, he obtained a new CGM and he feels that the sugars improved afterwards.  Reviewed his HbA1c levels: Lab Results  Component Value Date   HGBA1C 8.1 (A) 04/14/2019   HGBA1C 8.5 (H) 04/02/2017  10/11/2018: HbA1c 8.3% 10/19/2017: HbA1c 8.5%  Pt was on a regimen of: - Humalog 75/25 15 units 2x a day before meals - Humalog 2-8 units 3x a day, before meals - per sliding scale: ISF 50, target 120, ICR ~1:20  Currently on: - Toujeo 20 units at bedtime Tyler Aas was not covered) >> 20 units in a.m. - Humalog: Insulin to carb ratio: 1:12, but 1:10 with starches Target: 120 Sensitivity factor: 1:40  Pt checks his sugars with the new Freestyle Libre CGM:  Freestyle libre CGM parameters: - Average: 154 - % active CGM time:  95% of the time - Glucose variability 41.6% (target < or = to 36%) - time in range:  - very low (<54): 4% - low (54-69): 7% - normal range (70-180): 54% - high sugars (181-250): 27% - very high sugars (>250): 8%    Prev: - am: 115, 150-260, 311 >> 45, 54-131, 201, 228 >> 39 (03/02/2019), [id number] - 2h after b'fast: n/c >> 78, 144-351 - before lunch: 115-250 >> 97-225, 241 >> 66-321 - 2h after lunch: n/c >> 50-290 - before dinner: 130-160 >>  208-240 >> 75-393 - 2h after dinner: n/c >> 104-319 - bedtime: 130-160, 260 >> 160-200 >> 69, 70, 95-310, 310 - nighttime: 52-60 x3 in last mo >> 76 >> n/c Lowest sugar was 52 >> 45 >> 39 >> 40 (libre); he has hypoglycemia awareness in the 70s.  Highest sugar was 311 >> 300s >> 393 >> 300.  Glucometer: One Touch Verio  Pt's meals are: - Breakfast: egg sandwich or whole wheat bread + mayo, coffee - Lunch: microwaveable lunch, chicken sandwich, PB crackers or something else light - Dinner: sandwich with mayo + lean meat: [place] or ham, no veggies usually - Snacks: crackers, almonds at night He is walking for exercise.  She tried more intense exercise but had severe muscle aches.  -No CKD, last BUN/creatinine:  10/11/2018: 13/0.94, GFR 89, glucose 271, ACR 8 10/19/2017: 16/0.92, glucose 302, ACR <3.7 Lab Results  Component Value Date   BUN 10 04/07/2017   BUN 10 04/06/2017   CREATININE 0.80 04/07/2017   CREATININE 0.77 04/06/2017  On Cozaar 25.  -+ HL, last lipid panel: 10/11/2018: 106/41/36/62 10/19/2017: 112/46/41/62 No results found for: CHOL, HDL, LDLCALC, LDLDIRECT, TRIG, CHOLHDL On Crestor 40.  - last eye exam was in 09/2018: No DR  -+ Numbness and tingling -under his dose.  + Occasional leg cramps  On ASA 81.  He also has HTN.  Pt has FH of DM in daughter -  type 1 - dx. At 36 y/o. Patient's wife had breast cancer.  ROS: Constitutional: no weight gain/no weight loss, no fatigue, no subjective hyperthermia, no subjective hypothermia Eyes: no blurry vision, no xerophthalmia ENT: no sore throat, no nodules palpated in neck, no dysphagia, no odynophagia, no hoarseness Cardiovascular: no CP/no SOB/no palpitations/no leg swelling Respiratory: no cough/no SOB/no wheezing Gastrointestinal: no N/no V/no D/no C/no acid reflux Musculoskeletal: no muscle aches/no joint aches Skin: no rashes, no hair loss Neurological: no tremors/no numbness/no tingling/no dizziness  I  reviewed pt's medications, allergies, PMH, social hx, family hx, and changes were documented in the history of present illness. Otherwise, unchanged from my initial visit note.  Past Medical History:  Diagnosis Date  . Coronary artery disease   . Diabetes mellitus without complication (HCC)    Type I  . GERD (gastroesophageal reflux disease)   . Hyperlipidemia   . Hypertension   . Palpitations    eval 2014   Past Surgical History:  Procedure Laterality Date  . CARDIAC CATHETERIZATION N/A 02/10/2016   Procedure: Left Heart Cath and Coronary Angiography;  Surgeon: [name], MD;  Location: [facility];  Service: Cardiovascular;  Laterality: N/A;  . CARDIAC CATHETERIZATION N/A 02/10/2016   Procedure: Coronary Stent Intervention;  Surgeon: [name], MD;  Location: [facility];  Service: Cardiovascular;  Laterality: N/A;  . CORONARY ARTERY BYPASS GRAFT N/A 04/04/2017   Procedure: CORONARY ARTERY BYPASS GRAFTING (CABG), times one, using left internal mammary artery. Off pump;  Surgeon: Melrose Nakayama, MD;  Location: [facility];  Service: Open Heart Surgery;  Laterality: N/A;  . CORONARY STENT PLACEMENT  02/10/2016   STENT XIENCE ALPINE RX 3.25X15 drug eluting stent was successfully placed  . LEFT HEART CATH AND CORONARY ANGIOGRAPHY N/A 03/23/2017   Procedure: LEFT HEART CATH AND CORONARY ANGIOGRAPHY;  Surgeon: Martinique, Peter M, MD;  Location: [facility];  Service: Cardiovascular;  Laterality: N/A;  . SALIVARY GLAND SURGERY Left    benign  . TEE WITHOUT CARDIOVERSION N/A 04/04/2017   Procedure: TRANSESOPHAGEAL ECHOCARDIOGRAM (TEE);  Surgeon: Melrose Nakayama, MD;  Location: [facility];  Service: Open Heart Surgery;  Laterality: N/A;   Social History   Socioeconomic History  . Marital status: Married    Spouse name: Not on file  . Number of children: 2  . Years of education: Not on file  . Highest education level: Not on file  Occupational History  .  Occupation: Freight forwarder at [employer]   Social Needs  . Financial resource strain: Not on file  . Food insecurity    Worry: Not on file    Inability: Not on file  . Transportation needs    Medical: Not on file    Non-medical: Not on file  Tobacco Use  . Smoking status: Never Smoker  . Smokeless tobacco: Never Used  Substance and Sexual Activity  . Alcohol use: No  . Drug use: No   Current Outpatient Medications on File Prior to Visit  Medication Sig Dispense Refill  . rosuvastatin (CRESTOR) 40 MG tablet TAKE ONE TABLET BY MOUTH AT BEDTIME. 30 tablet 10  . acetaminophen (TYLENOL) 500 MG tablet Take 2 tablets (1,000 mg total) by mouth every 8 (eight) hours as needed. 30 tablet 0  . aspirin EC 81 MG tablet Take 81 mg by mouth daily.    . Continuous Blood Gluc Sensor (FREESTYLE LIBRE 2 SENSOR SYSTM) MISC 1 each by Does not apply route  every 14 (fourteen) days. 6 each 3  . Glucagon 3 MG/DOSE POWD Place 3 mg into the nose once as needed for up to 1 dose. 1 each 11  . insulin lispro (HUMALOG KWIKPEN) 100 UNIT/ML KwikPen Inject 0.02-0.1 mLs (2-10 Units total) into the skin 3 (three) times daily. 15 mL 3  . Insulin Pen Needle 32G X 4 MM MISC Use 4-6x a day 400 each 3  . losartan (COZAAR) 25 MG tablet TAKE ONE TABLET BY MOUTH DAILY. 90 tablet 3  . metoprolol succinate (TOPROL-XL) 50 MG 24 hr tablet Take 1 tablet (50 mg total) by mouth daily. 90 tablet 3  . Multiple Vitamins-Minerals (MULTIVITAMIN WITH MINERALS) tablet Take 1 tablet by mouth daily.    Marland Kitchen oxyCODONE (OXY IR/ROXICODONE) 5 MG immediate release tablet Take 1 tablet (5 mg total) by mouth every 4 (four) hours as needed for severe pain. 30 tablet 0  . TOUJEO SOLOSTAR 300 UNIT/ML SOPN INJECT 18-20 UNITS INTO SKIN AT BEDTIME (BOX MUST LAST 66 DAYS PER INSURANCE) 4.5 mL 2   No current facility-administered medications on file prior to visit.   No Known Allergies Family History  Problem Relation Age of Onset  . Coronary artery disease  Mother 49       Died age 64, CABG  . CAD Father 70       Died age 34, CABG  . CAD Brother 62       Stents  . Heart failure Maternal Grandmother     PE: BP 128/80   Pulse 76   Ht 5\' 9"  (1.753 m)   Wt 152 lb (68.9 kg)   SpO2 98%   BMI 22.45 kg/m  Wt Readings from Last 3 Encounters:  07/18/19 152 lb (68.9 kg)  04/14/19 154 lb (69.9 kg)  12/31/18 153 lb (69.4 kg)   Constitutional: normal weight, in NAD Eyes: PERRLA, EOMI, no exophthalmos ENT: moist mucous membranes, no thyromegaly, no cervical lymphadenopathy Cardiovascular: RRR, No MRG Respiratory: CTA B Gastrointestinal: abdomen soft, NT, ND, BS+ Musculoskeletal: no deformities, strength intact in all 4 Skin: moist, warm, no rashes Neurological: no tremor with outstretched hands, DTR normal in all 4  ASSESSMENT: 1. DM1, insulin-dependent, uncontrolled, with complications - CAD - s/p DES 2017, CABG x1 04/2017 - PN  We diagnosed type 1 diabetes based on the low C-peptide suggesting insulin deficiency: Component     Latest Ref Rng & Units 12/31/2018  ZNT8 Antibodies     U/mL <15  Glutamic Acid Decarb Ab     <5 IU/mL <5  C-Peptide     0.80 - 3.85 ng/mL <0.10 (L)  Glucose, Plasma     65 - 99 mg/dL 94  POC Glucose     70 - 99 mg/dl 132 (A)  Islet Cell Ab     Neg:<1:1 Negative   2. HL  PLAN:  1. Patient with longstanding, uncontrolled, insulin-dependent diabetes, which we diagnosed as type I due to low insulin production.  His sugars are highly fluctuating, ranging 50s and 300s, consistent with insulin deficiency.  He is currently on a basal-bolus insulin regimen with Toujeo and Humalog.  Tyler Aas was not covered by his insurance.  This would have been better in preventing glucose fluctuations.  He was taking Toujeo at bedtime but had lows in the morning so we moved Toujeo to am. -Since he has type I, rather than type 2 diabetes, he is not a good candidate for SGLT2 inhibitors (he also has muscle cramps and dehydration).  However, if his insurance covers it, he may be a candidate for off label use of GLP-1 receptor agonist, due to his cardiovascular history -At last visit, due to the high variability in his blood sugars, we did not change his regimen.  I did suggest to get the freestyle libre 2 CGM.  HbA1c at last visit was 8.1%, slightly better. -At this visit, he tells me he did obtain the CGM and started it last month (but did not start the Mc[employer]ee 2, which has alarms).  He feels that his sugars improved after starting this.  However, reviewing the CGM downloads today (we will scan these), his sugars decrease overnight even to the 40s and increase afterwards after every meal.  We discussed that low sugars on the CGM may be 20 to 30 mg/dL higher if he checks with his glucometer.  However, the trend overnight is clear so we will need to decrease his Toujeo.  Also, he needs more insulin meals.  He tells me that he is now taking approximately 3 units with meals, which does not appear to be in.  We change his insulin to carb ratio today to allow him to get more insulin with his meals. -His glucose management indicator (GMI) is 7.0% for the last 2 weeks, which is indicative of his next HbA1c if sugars remain as controlled as now. - I suggested to:  Patient Instructions  Please decrease: - Toujeo to 18 units in a.m.  Change: - Humalog: Insulin to carb ratio:  - 1:10 - 1:8 with starches: rice, pasta, potatoes, etc. Target: 120 Sensitivity factor: 1:40  Please do the following approximately 15 minutes before every meal: - count carbs (C) - check sugars (S) - inject insulin (I)  Please return in 3-4 months with your sugar log.   - we checked his HbA1c: 7.8% (better) - advised to check sugars at different times of the day - 3-4x a day, rotating check times - advised for yearly eye exams >> he is UTD - return to clinic in 3-4 months  2. HL - Reviewed latest lipid panel from 2020: LDL is at goal, lower than 70, HDL  slightly low - Continues the statin without side effects.  Philemon Kingdom, MD PhD [facility]

## 2019-08-29 ENCOUNTER — Other Ambulatory Visit: Payer: Self-pay | Admitting: Internal Medicine

## 2019-09-11 NOTE — Research (Signed)
Late entry  COORDINATE-Diabetes 6 Month CASE REPORT FORM (Intervention) -EHR Site #:   [id number]              Patient ID:           [id number]   6 MONTH EHR REVIEW  Medical Record Check Date 08/APR/2021   Vital Status '[x]'$ Patient Alive >Date last known alive per EHR:   '[]'$ Patient Dead >> Complete Death Form  '[]'$ Unknown   CLINICAL EVENTS / PROCEDURES  Hospitalization since last visit? (>=24 hour stay) '[x]'$ No '[]'$ Yes  >> if yes, Complete the following  Date of hospital admission:        / /             MM DD YYYY  Primary discharge diagnosis:  *Complete appropriate event validation form '[]'$ acute myocardial infarction (heart attack)* '[]'$ stroke* '[]'$ heart failure* '[]'$ coronary revascularization* '[]'$ peripheral revascularization* '[]'$ cerebral revascularization* '[]'$ diabetes (e.g. hypoglycemia, DKA) '[]'$ renal failure '[]'$ amputation '[]'$ other cardiovascular reason '[]'$ other NON-cardiovascular reason '[]'$ unknown  Other diagnoses not documented above: (check all that apply)  *Complete appropriate event validation form '[]'$ acute myocardial infarction (heart attack)* '[]'$ stroke* '[]'$ heart failure* '[]'$ coronary revascularization* '[]'$ peripheral revascularization* '[]'$ cerebral revascularization* '[]'$ diabetes (e.g. hypoglycemia, DKA) '[]'$ renal failure '[]'$ amputation '[]'$ other cardiovascular reason '[]'$ other NON-cardiovascular reason '[]'$ unknown  Were any of the following outpatient procedures done since the last visit? (I.e. procedures not captured above)  Coronary revascularization '[x]'$ No '[]'$ Yes >IF YES, Date / /             MM DD YYYY  Peripheral revascularization '[x]'$ No '[]'$ Yes > IF YES, Date / /             MM DD YYYY  Cerebral revascularization '[x]'$ No '[]'$ Yes > IF YES, Date / /             MM DD YYYY  Extremity amputation    '[x]'$ No '[]'$ Yes >IF YES, Date / /             MM      DD       YYYY  Renal replacement therapy (i.e. dialysis)    '[x]'$ No '[]'$ Yes > IF YES, Date of initiation / /             MM      DD       YYYY   **EDC will allow for  collection of multiple hospitalizations and procedures   MEDICATIONS  Medication Currently Prescribed? If started since last visit: If not started since last visit: If stopped since last visit:  Cardiac Medications  ACE Inhibitor / Angiotensin Receptor Blocker (ARB) / Angiotensin Receptor Neprilysin inhibitor (ARNi)  '[]'$ No >  '[x]'$ Yes > Since last visit, medication was: '[]'$ Stopped '[]'$ Not started '[]'$ Started '[x]'$ Continued same medication '[]'$ Continued with medication changes Date started:        / /             MM DD YYYY Who prescribed? '[]'$ Cardiology provider  '[]'$ Study clinic '[]'$ Outside clinic '[]'$ Endocrinology provider '[]'$ Primary care provider '[]'$ Other provider  Specify:                             '[]'$ Unknown Reason (check all that apply): '[]'$ History of swelling around lips, eyes or face '[]'$ Feeling dizzy/lightheaded '[]'$ Low blood pressure '[]'$ Poor or fluctuating kidney function '[]'$ High potassium '[]'$ Patient has experienced other side effects to this medication  before '[]'$ Patient will be unable to adhere/monitor '[]'$ Patient unable to afford it '[]'$ Patient does not want to      take this medication '[]'$ Pregnancy '[]'$ Other (specify: ) '[]'$   Unknown Reason Date discontinued:        / /             MM DD YYYY Reason (check all that apply): '[]'$ Swelling around lips, eyes       or face '[]'$ Feeling dizzy/lightheaded '[]'$ Low blood pressure '[]'$ Poor or fluctuating kidney function '[]'$ High potassium '[]'$ Other medication side       effects '[]'$ Patient unable to        adhere/monitor '[]'$ Had an       operation/procedure that        required stopping it '[]'$ Patient unable to afford it '[]'$ Patient no longer wants       to take this medication '[]'$ Pregnancy '[]'$ Other (specify: ) '[]'$ Unknown Reason   If started or changed  Medication Name: '[]'$ Benazepril (Lotensin) '[]'$ Captopril (Capoten) '[]'$ Enalapril (Vasotec) '[]'$ Fosinopril (Monopril) '[]'$ Lisinopril (Zestril, Prinivil) '[]'$ Quinapril (Accupril) '[]'$ Ramipril (Altace) '[]'$ Azilsartan  (Edarbi) '[]'$ Candesartan (Atacand) '[]'$ Irbesartan (Avapro) '[]'$ Losartan (Cozaar) '[]'$ Olmesartan (Benicar) '[]'$ Telmisartan (Micardis) '[]'$ Valsartan (Diovan) '[]'$ Sacrubitril/Valsartan (Entresto)     Beta Blocker '[]'$ No '[x]'$  Yes > '[]'$ Acebutolol (Sectral) '[]'$ Bisoprolol (Zebeta) '[]'$ Carvedilol (Coreg) '[]'$ Labetalol (Trandate,      Normodyne) '[x]'$ Metoprolol succinate (Toprol) '[]'$ Metoprolol tartrate       (Lopressor) '[]'$ Nadolol (Corgard) '[]'$ Nebivolol (Bystolic) '[]'$ Propranolol (Inderal) '[]'$ Sotalol (Betapace)     Medication Currently Prescribed? If started since last visit: If not started since last visit: If stopped since last visit:  Aldosterone Antagonist '[x]'$ No '[]'$ Yes > '[]'$ Amiloride '[]'$ Eplerenone (Inspra) '[]'$ Spirinolactone (Aldactone) '[]'$ Traimterene (Dyrenium)   Calcium Channel Blocker '[x]'$ No '[]'$  Yes > Medication Name: '[]'$ Amlodipine (Norvasc) '[]'$ Diltiazem (Cardizem) '[]'$ Felodipine (Plendil) '[]'$ Nifedipine (Procardia) '[]'$ Verapamil (Calan)   Diuretic Loop '[x]'$ No '[]'$  Yes > Medication Name: '[]'$ Bumetanide (Bumex) '[]'$ Ethacrynic acid (Edecrin) '[]'$ Furosemide (Lasix) '[]'$ Torsemide (Demadex)   Diuretic Thiazide- type '[x]'$ No '[]'$  Yes > Medication Name: '[]'$ Chlorothiazide      '[]'$ Chlorthalidone '[]'$ Hydrochlorothiazide '[]'$ Indapamide '[]'$ Metolazone   Anticoagulation Therapy (other than Warfarin) '[x]'$ No '[]'$  Yes > Medication Name: '[]'$ Apixaban (Eliquis) '[]'$ Edoxaban (Lixiana) '[]'$ Rivaroxaban (Xeralto) '[]'$ Dabigatran (Redaxa)   Warfarin '[x]'$ No '[]'$  Yes    Antiplatelet Agent (including aspirin) '[]'$ No '[x]'$  Yes > Medication Name (check all that apply): '[x]'$ Aspirin '[]'$ Clopidogrel (Plavix) '[]'$ Prasugrel (Effient) '[]'$ Ticagrelor (Brilinta) '[]'$ Ticlopidine (Ticlid) '[]'$ Dipyridamole (Persantine)    Medication Currently Prescribed? If started since last visit: If not started since last visit: If stopped since last visit:  Statin  '[]'$ No >  '[x]'$ Yes > Since last visit, medication was: '[]'$ Stopped '[]'$ Not started '[]'$ Started '[x]'$ Continued same  medication and dose '[]'$ Continued with dose or medication changes Date started:        / /             MM DD YYYY Who prescribed? '[]'$ Cardiology provider '[]'$ Endocrinology provider '[]'$ Primary care provider '[]'$ Other provider  Specify:                             '[]'$ Unknown Reason (check all that apply): '[]'$ History of Rhabdomyolysis '[]'$ LDL-cholesterol already       <70 '[]'$ Muscle      aches/pain/weakness '[]'$ Mental       fogginess/memory loss '[]'$ Liver dysfunction '[]'$ Patient has  experienced other side   effects to this medication before '[]'$ Patient will be unable to adhere/monitor '[]'$ Patient unable to afford it '[]'$ Patient does not want to take this medication '[]'$ Pregnancy '[]'$ Other (specify: ) '[]'$ Unknown Reason Date discontinued:        / /             MM DD YYYY Reason (check all that apply): '[]'$ Rhabdomyolysis '[]'$ Muscle aches/pain/weakness '[]'$ Mental fogginess/memory loss '[]'$ Liver dysfunction '[]'$ Other medication side effects '[]'$ Patient unable to  adhere/monitor '[]'$ Patient unable to afford it '[]'$ Patient no longer wants to take       this medication '[]'$ Pregnancy '[]'$ Other (specify: ) '[]'$ Unknown Reason   If started or changed  Medication Name: '[]'$ Atorvastatin (Lipitor) '[]'$ Fluvastatin (Lescol) '[]'$ Lovastatin (Mevacor) '[]'$ Pravastatin (Pravachol) '[]'$ Rosuvastatin (Crestor) '[]'$ Simvastatin (Zocor) '[]'$ Pitatavastatin (Livalo)  Dose: '[]'$ 1 mg '[]'$ 10 mg '[]'$ 2 mg '[]'$  20 mg '[]'$ 3 mg '[]'$  40 mg '[]'$ 4 mg '[]'$  60 mg '[]'$ 5 mg '[]'$  80 mg  Frequency: '[]'$ Daily  '[]'$ Less than daily      Does the patient have statin intolerance that prevents the use of maximum dose of high potency statin? '[]'$ No '[]'$ Yes >IF YES, Complete Statin Intolerance form   Non-statin lipid lowering therapy '[x]'$ No '[]'$ Yes > Medication Name (check all that apply): '[]'$ Colesevelam (Welchol) '[]'$ Ezetimibe (Zetia) '[]'$ Fibrate '[]'$ Niacin '[]'$ PCSK9 inhibitor '[]'$ Omega 3 acid ethyl esters (Lovaza) '[]'$ Icosapent Ethyl (Vascepa) '[]'$ Over the counter omega 3 fatty acid or fish oil  supplement     Medication Currently Prescribed? If started since last visit: If not started since last visit: If stopped since last visit:  Diabetes Medications  SGLT2 Inhibitor  '[x]'$ No >  '[]'$ Yes > Since last visit, medication was: '[]'$ Stopped '[x]'$ Not started '[]'$ Started '[]'$ Continued same medication '[]'$ Continued with medication changes Date started:        / /             MM DD YYYY Who prescribed? '[]'$ Cardiology provider  '[]'$ Study clinic '[]'$ Outside clinic '[]'$ Endocrinology      provider '[]'$ Primary care provider '[]'$ Other provider  Specify:                             '[]'$ Unknown Reason (check all that apply): '[]'$ eGFR <45 '[]'$ HbA1c<7% on metformin monotherapy OR already on GLP1RA and do not need to start another anti- hyperglycemic '[]'$ Already dehydrated '[]'$ Low blood pressure '[]'$ High risk of Hypoglycemia '[]'$ Prior DKA '[]'$ Recurrent mycotic genital infections '[]'$ History of or at risk for amputation '[]'$ Patient has experienced other side effects to this medication before '[]'$ Patient will be unable to adhere/monitor '[]'$ Patient unable to afford it '[]'$ Patient does not want to take this medication '[]'$ Pregnancy '[]'$ Other (specify: ) '[]'$ Unknown Reason Date discontinued:        / /             MM DD YYYY Reason (check all that apply  '[]'$ eGFR now <45 '[]'$ Dehydration '[]'$ Low blood pressure '[]'$ Hypoglycemia '[]'$ DKA '[]'$ Mycotic genital infection '[]'$ Amputation '[]'$ Other medication side effects '[]'$ Patient unable    to adhere/monitor  '[]'$ Had an operation/procedure     that required stopping it '[]'$ Patient unable to afford it '[]'$ Patient no longer wants to      take this medication '[]'$ Pregnancy '[]'$ Other (specify: ) '[x]'$ Unknown Reason   If started or changed  Medication Name: '[]'$ Canaglifozin (Invokana) '[]'$ Dapagliflozin (Farxiga) '[]'$ Empaglifozin (Jardiance) '[]'$ Ertugliflozin Actuary)           Medication Currently Prescribed? If started since last visit: If not started since last visit: If stopped since last visit:   GLP1 Receptor Agonist  '[x]'$ No >  '[]'$ Yes > Since last visit, medication was: '[]'$ Stopped '[x]'$ Not started '[]'$ Started '[]'$ Continued same medication '[]'$ Continued with medication changes Date started:        / /             MM DD YYYY Who prescribed? '[]'$ Cardiology provider  '[]'$ Study clinic '[]'$ Outside clinic '[]'$ Endocrinology       provider '[]'$ Primary care provider '[]'$ Other provider > Specify:                             '[]'$   Unknown Reason (check all that apply): '[]'$ Personal or family history of medullary thyroid cancer '[]'$ MEN2 '[]'$ HbA1c<7% on metformin monotherapy OR already on SGLT2i and do not need to start another anti-hyperglycemic '[]'$ eGFR now <30 '[]'$ High risk of Hypoglycemia '[]'$ History of pancreatitis '[]'$ Significant gastroparesis '[]'$ Prior gastric surgery '[]'$ Patient has experienced other side effects to this medication before '[]'$ Patient will be unable to adhere/monitor '[]'$ Patient unable to afford it '[]'$ Patient does not want to take this medication '[]'$ Pregnancy '[]'$ Other (specify: ) '[]'$ Unknown Reason Date discontinued:        / /             MM DD YYYY Reason (check all that apply): '[]'$ Medullary thyroid cancer '[]'$ MEN2 '[]'$ eGFR now <30 '[]'$ Hypoglycemia '[]'$ Pancreatitis '[]'$ Significant gastroparesis '[]'$ Gastric surgery '[]'$ Other medication side       effects '[]'$   Patient  unable    to adhere/monitor                                  '[]'$ Had an operation/procedure that required stopping it '[]'$ Patient unable to afford it '[]'$ Patient no longer wants to take this medication '[]'$ Pregnancy '[]'$ Other (specify: ) '[x]'$ Unknown Reason   If started or changed > Medication Name: '[]'$ Albiglutide (Tanzeum) '[]'$ Dulaglutide (Trulicity) '[]'$ Exanatide (Byetta, Bydureon) '[]'$ Liraglutide (Victoza, Saxenda) '[]'$ Lixisenatide (Adlyxin) '[]'$ Semaglutice (Ozempic)      Medication Currently Prescribed? If started since last visit: If not started since last visit: If stopped since last visit:  Other non Insulin diabetes medications '[x]'$ No '[]'$ Yes  > Medication Name (check all that apply): '[]'$ Acarbose (Precose) '[]'$ Miglitol (Glyset) '[]'$ Glimepiride (Amaryl) '[]'$ Glipizide (Amaryl) '[]'$ Glyburide (Diabeta,       Glynase,   Micronase) '[]'$ Metformin (Fortamet,        Glucophage[including XR],        Glumetza, Riomet) '[]'$ Pioglitazone (Actos) '[]'$ Nateglinide (Starlix) '[]'$ Pramlintide (Symilin) '[]'$ Repaglinide (Prandin) '[]'$ Rosiglitazone (Avandia) '[]'$ Alogliptin (Nesina) '[]'$ Linagliptin (Tradjenta) '[]'$ Saxagliptin (Onglyza) '[]'$ Sitagliptin (Januvia) '[]'$ Bromocriptine Quick Release (Cycloset)     Insulin '[]'$ No '[x]'$  Yes > total daily dose: units 20     STATIN INTOLERANCE (PER EHR/OTHER SOURCE DATA)  1. Was CK checked? '[x]'$ No '[]'$ Yes   >If yes, select from the following: '[]'$ CK not elevated '[]'$ CK elevated 1-5x upper limit of normal '[]'$ CK elevated >5x upper limit of normal  2. Does the patient have muscle symptoms? '[]'$ No '[]'$ Yes    >If yes, select from the following: Location and pattern of muscle symptoms (select all that apply) '[]'$ Symmetric, hip flexors or thighs '[]'$ Symmetric, calves '[]'$ Symmetric, proximal upper extremity '[]'$ Asymmetric, intermittent, or not specific to any area '[]'$ Unknown   Timing of muscle symptom in relation to starting statin regimen '[]'$ <4 weeks '[]'$ 4-12 weeks '[]'$ >12 weeks '[]'$ Unknown   Timing of muscle symptoms improvement after withdrawal of statin '[]'$ <2 weeks '[]'$ 2-4 weeks '[]'$ No improvement after 4 weeks '[]'$ Unknown  3. Was patient re-challenged with a statin regimen (even if same statin compound or regimen as above)?  '[]'$ No  '[]'$ Yes  '[]'$ Unknown  >If yes, select from the following: Timing of recurrence of similar muscle symptoms in relation to starting second regimen '[]'$ <4 weeks '[]'$ 4-12 weeks '[]'$ >12 weeks '[]'$ Similar symptoms did not recur '[]'$ Unknown   [email or web address]

## 2019-09-11 NOTE — Research (Signed)
Entered in error note below

## 2019-09-16 ENCOUNTER — Telehealth: Payer: Self-pay | Admitting: *Deleted

## 2019-09-16 NOTE — Telephone Encounter (Signed)
I called patient back for 64-month Coordinate-Diabetes Study. I left patient message on mobile phone to call me back.

## 2019-10-03 ENCOUNTER — Other Ambulatory Visit: Payer: Self-pay

## 2019-10-03 ENCOUNTER — Ambulatory Visit (INDEPENDENT_AMBULATORY_CARE_PROVIDER_SITE_OTHER): Payer: Commercial Managed Care - PPO | Admitting: Internal Medicine

## 2019-10-03 ENCOUNTER — Encounter: Payer: Self-pay | Admitting: Internal Medicine

## 2019-10-03 VITALS — BP 118/60 | HR 67 | Ht 69.0 in | Wt 154.0 lb

## 2019-10-03 DIAGNOSIS — E1059 Type 1 diabetes mellitus with other circulatory complications: Secondary | ICD-10-CM | POA: Diagnosis not present

## 2019-10-03 DIAGNOSIS — E7800 Pure hypercholesterolemia, unspecified: Secondary | ICD-10-CM | POA: Diagnosis not present

## 2019-10-03 LAB — POCT GLYCOSYLATED HEMOGLOBIN (HGB A1C): Hemoglobin A1C: 7.5 % — AB (ref 4.0–5.6)

## 2019-10-03 LAB — LIPID PANEL
Cholesterol: 114 mg/dL (ref 0–200)
HDL: 41.4 mg/dL (ref >39.00)
LDL Cholesterol: 64 mg/dL (ref 0–99)
NonHDL: 72.21
Total CHOL/HDL Ratio: 3
Triglycerides: 42 mg/dL (ref 0.0–149.0)
VLDL: 8.4 mg/dL (ref 0.0–40.0)

## 2019-10-03 LAB — COMPREHENSIVE METABOLIC PANEL
ALT: 20 U/L (ref 0–53)
AST: 24 U/L (ref 0–37)
Albumin: 4.5 g/dL (ref 3.5–5.2)
Alkaline Phosphatase: 45 U/L (ref 39–117)
BUN: 17 mg/dL (ref 6–23)
CO2: 31 mEq/L (ref 19–32)
Calcium: 9.6 mg/dL (ref 8.4–10.5)
Chloride: 103 mEq/L (ref 96–112)
Creatinine, Ser: 0.85 mg/dL (ref 0.40–1.50)
GFR: 92.04 mL/min (ref >60.00)
Glucose, Bld: 103 mg/dL — ABNORMAL HIGH (ref 70–99)
Potassium: 4.4 mEq/L (ref 3.5–5.1)
Sodium: 140 mEq/L (ref 135–145)
Total Bilirubin: 0.5 mg/dL (ref 0.2–1.2)
Total Protein: 7.6 g/dL (ref 6.0–8.3)

## 2019-10-03 LAB — TSH: TSH: 1.53 u[IU]/mL (ref 0.35–4.50)

## 2019-10-03 LAB — MICROALBUMIN / CREATININE URINE RATIO
Creatinine,U: 69.1 mg/dL
Microalb Creat Ratio: 1.5 mg/g (ref 0.0–30.0)
Microalb, Ur: 1 mg/dL (ref 0.0–1.9)

## 2019-10-03 MED ORDER — FREESTYLE LIBRE 2 READER DEVI: 1.0000 | Freq: Every day | 0 refills | Status: DC

## 2019-10-03 MED ORDER — FREESTYLE LIBRE 2 SENSOR MISC: 1.0000 | 3 refills | Status: DC

## 2019-10-03 NOTE — Patient Instructions (Addendum)
Please continue: - Toujeo 18 units in a.m. - Humalog: ICR:  - 1:10 - 1:8 with starches: rice, pasta, potatoes, etc. Target: 120 Sensitivity factor: 1:40  Please do the following approximately 15 minutes before every meal: - count carbs (C) - check sugars (S) - inject insulin (I)  Please stop at the lab.  Please let me know when we can start Lyumjev.  Please return in 3-4 months with your sugar log.

## 2019-10-03 NOTE — Progress Notes (Signed)
Patient ID: Kelly Hardy, male   DOB: 1960/08/26, 59 y.o.   MRN: [id number]   This visit occurred during the SARS-CoV-2 public health emergency.  Safety protocols were in place, including screening questions prior to the visit, additional usage of staff PPE, and extensive cleaning of exam room while observing appropriate contact time as indicated for disinfecting solutions.   HPI: Kelly Hardy is a 59 y.o.-year-old male, referred by his cardiologist, Dr. Percival Spanish, for management of DM type I, initially dx in 1993 as DM type II, insulin-dependent since 1994, uncontrolled, with complications (CAD - s/p DES 2017, CABG x1 2019; PN). He saw Dr. Forde Dandy until few years ago.  Last visit with me 3 months ago.  His sugars improved after he obtained a freestyle libre CGM before last visit.  Reviewed HbA1c levels: Lab Results  Component Value Date   HGBA1C 7.8 (A) 07/18/2019   HGBA1C 8.1 (A) 04/14/2019   HGBA1C 8.5 (H) 04/02/2017  10/11/2018: HbA1c 8.3% 10/19/2017: HbA1c 8.5%  Pt was on a regimen of: - Humalog 75/25 15 units 2x a day before meals - Humalog 2-8 units 3x a day, before meals - per sliding scale: ISF 50, target 120, ICR ~1:20  Currently on: - Toujeo 20 >> 18 units in a.m. - Humalog: ICR:  - 1:10 - 1:8 with starches: rice, pasta, potatoes, etc. Target: 120 Sensitivity factor: 1:40  He checks his sugars more than 4 times a day with his freestyle libre CGM (we will scan the reports)  Freestyle libre CGM parameters: - Average: 154 >> 164 - % active CGM time:  95% >> 92% of the time - Glucose variability 41.6% >> 38.6% (target < or = to 36%) - time in range:  - very low (<54): 4% >> 1% - low (54-69): 7% >> 4% - normal range (70-180): 54% >> 57% - high sugars (181-250): 27% >> 29% - very high sugars (>250): 8% >> 9%     Previously:   Lowest sugar was 52 >> 45 >> 39 >> 40 (libre CGM); he has hypoglycemia awareness in the 70s.  Highest sugar was 311 >> 300s >> 393 >>  300.  Glucometer: One Touch Verio  Pt's meals are: - Breakfast: egg sandwich or whole wheat bread + mayo, coffee - Lunch: microwaveable lunch, chicken sandwich, PB crackers or something else light - Dinner: sandwich with mayo + lean meat: [place] or ham, no veggies usually - Snacks: crackers, almonds at night He is walking for exercise.  She tried more intense exercise but had severe muscle aches.  -No CKD, last BUN/creatinine:  10/11/2018: 13/0.94, GFR 89, glucose 271, ACR 8 10/19/2017: 16/0.92, glucose 302, ACR <3.7 Lab Results  Component Value Date   BUN 10 04/07/2017   BUN 10 04/06/2017   CREATININE 0.80 04/07/2017   CREATININE 0.77 04/06/2017  On Cozaar 25.  -+ HL, last lipid panel: 10/11/2018: 106/41/36/62 10/19/2017: 112/46/41/62 No results found for: CHOL, HDL, LDLCALC, LDLDIRECT, TRIG, CHOLHDL On Crestor 40.  - last eye exam was in 09/2018: No DR  -He has numbness and tingling -under his toes.  + Occasional leg cramps.  On ASA 81.  He also has HTN.  Pt has FH of DM in daughter - type 1 - dx. At 2 y/o. Patient's wife had breast cancer.  ROS: Constitutional: no weight gain/no weight loss, no fatigue, no subjective hyperthermia, no subjective hypothermia Eyes: no blurry vision, no xerophthalmia ENT: no sore throat, no nodules palpated in neck, no dysphagia, no  odynophagia, no hoarseness Cardiovascular: no CP/no SOB/no palpitations/no leg swelling Respiratory: no cough/no SOB/no wheezing Gastrointestinal: no N/no V/no D/no C/no acid reflux Musculoskeletal: no muscle aches/no joint aches Skin: no rashes, no hair loss Neurological: no tremors/no numbness/no tingling/no dizziness  I reviewed pt's medications, allergies, PMH, social hx, family hx, and changes were documented in the history of present illness. Otherwise, unchanged from my initial visit note.  Past Medical History:  Diagnosis Date  . Coronary artery disease   . Diabetes mellitus without complication  (HCC)    Type I  . GERD (gastroesophageal reflux disease)   . Hyperlipidemia   . Hypertension   . Palpitations    eval 2014   Past Surgical History:  Procedure Laterality Date  . CARDIAC CATHETERIZATION N/A 02/10/2016   Procedure: Left Heart Cath and Coronary Angiography;  Surgeon: [name], MD;  Location: [facility];  Service: Cardiovascular;  Laterality: N/A;  . CARDIAC CATHETERIZATION N/A 02/10/2016   Procedure: Coronary Stent Intervention;  Surgeon: [name], MD;  Location: [facility];  Service: Cardiovascular;  Laterality: N/A;  . CORONARY ARTERY BYPASS GRAFT N/A 04/04/2017   Procedure: CORONARY ARTERY BYPASS GRAFTING (CABG), times one, using left internal mammary artery. Off pump;  Surgeon: Melrose Nakayama, MD;  Location: [facility];  Service: Open Heart Surgery;  Laterality: N/A;  . CORONARY STENT PLACEMENT  02/10/2016   STENT XIENCE ALPINE RX 3.25X15 drug eluting stent was successfully placed  . LEFT HEART CATH AND CORONARY ANGIOGRAPHY N/A 03/23/2017   Procedure: LEFT HEART CATH AND CORONARY ANGIOGRAPHY;  Surgeon: Martinique, Peter M, MD;  Location: [facility];  Service: Cardiovascular;  Laterality: N/A;  . SALIVARY GLAND SURGERY Left    benign  . TEE WITHOUT CARDIOVERSION N/A 04/04/2017   Procedure: TRANSESOPHAGEAL ECHOCARDIOGRAM (TEE);  Surgeon: Melrose Nakayama, MD;  Location: [facility];  Service: Open Heart Surgery;  Laterality: N/A;   Social History   Socioeconomic History  . Marital status: Married    Spouse name: Not on file  . Number of children: 2  . Years of education: Not on file  . Highest education level: Not on file  Occupational History  . Occupation: Freight forwarder at [employer]   Social Needs  . Financial resource strain: Not on file  . Food insecurity    Worry: Not on file    Inability: Not on file  . Transportation needs    Medical: Not on file    Non-medical: Not on file  Tobacco Use  . Smoking status: Never Smoker  .  Smokeless tobacco: Never Used  Substance and Sexual Activity  . Alcohol use: No  . Drug use: No   Current Outpatient Medications on File Prior to Visit  Medication Sig Dispense Refill  . acetaminophen (TYLENOL) 500 MG tablet Take 2 tablets (1,000 mg total) by mouth every 8 (eight) hours as needed. 30 tablet 0  . aspirin EC 81 MG tablet Take 81 mg by mouth daily.    . Continuous Blood Gluc Sensor (FREESTYLE LIBRE 14 DAY SENSOR) MISC USE AS DIRECTED EVERY 14 DAYS. 2 each 3  . Glucagon 3 MG/DOSE POWD Place 3 mg into the nose once as needed for up to 1 dose. 1 each 11  . insulin lispro (HUMALOG KWIKPEN) 100 UNIT/ML KwikPen Inject 0.02-0.1 mLs (2-10 Units total) into the skin 3 (three) times daily. 15 mL 3  . Insulin Pen Needle 32G X 4 MM MISC Use 4-6x a  day 400 each 3  . losartan (COZAAR) 25 MG tablet TAKE ONE TABLET BY MOUTH DAILY. 90 tablet 3  . metoprolol succinate (TOPROL-XL) 50 MG 24 hr tablet Take 1 tablet (50 mg total) by mouth daily. 90 tablet 3  . Multiple Vitamins-Minerals (MULTIVITAMIN WITH MINERALS) tablet Take 1 tablet by mouth daily.    Marland Kitchen oxyCODONE (OXY IR/ROXICODONE) 5 MG immediate release tablet Take 1 tablet (5 mg total) by mouth every 4 (four) hours as needed for severe pain. 30 tablet 0  . rosuvastatin (CRESTOR) 40 MG tablet TAKE ONE TABLET BY MOUTH AT BEDTIME. 30 tablet 10  . TOUJEO SOLOSTAR 300 UNIT/ML SOPN INJECT 18-20 UNITS INTO SKIN AT BEDTIME (BOX MUST LAST 66 DAYS PER INSURANCE) 4.5 mL 2   No current facility-administered medications on file prior to visit.   No Known Allergies Family History  Problem Relation Age of Onset  . Coronary artery disease Mother 47       Died age 26, CABG  . CAD Father 20       Died age 75, CABG  . CAD Brother 73       Stents  . Heart failure Maternal Grandmother     PE: BP 118/60   Pulse 67   Ht 5\' 9"  (1.753 m)   Wt 154 lb (69.9 kg)   SpO2 98%   BMI 22.74 kg/m  Wt Readings from Last 3 Encounters:  10/03/19 154 lb (69.9 kg)    07/18/19 155 lb 3.2 oz (70.4 kg)  07/18/19 152 lb (68.9 kg)   Constitutional: normal weight, in NAD Eyes: PERRLA, EOMI, no exophthalmos ENT: moist mucous membranes, no thyromegaly, no cervical lymphadenopathy Cardiovascular: RRR, No MRG Respiratory: CTA B Gastrointestinal: abdomen soft, NT, ND, BS+ Musculoskeletal: no deformities, strength intact in all 4 Skin: moist, warm, no rashes Neurological: no tremor with outstretched hands, DTR normal in all 4  ASSESSMENT: 1. DM1, insulin-dependent, uncontrolled, with complications - CAD - s/p DES 2017, CABG x1 04/2017 - PN  We diagnosed type 1 diabetes based on the low C-peptide suggesting insulin deficiency: Component     Latest Ref Rng & Units 12/31/2018  ZNT8 Antibodies     U/mL <15  Glutamic Acid Decarb Ab     <5 IU/mL <5  C-Peptide     0.80 - 3.85 ng/mL <0.10 (L)  Glucose, Plasma     65 - 99 mg/dL 94  POC Glucose     70 - 99 mg/dl 132 (A)  Islet Cell Ab     Neg:<1:1 Negative   2. HL  PLAN:  1. Patient with longstanding, uncontrolled, insulin-dependent diabetes, which we diagnosed as type I in 12/2018, due to low insulin production.  His sugars are highly fluctuating, consistent with insulin deficiency.  He is currently on a basal-bolus insulin regimen, which we adjusted at last visit.  He also has a CGM, which he finds very helpful.  However, he would like to switch to the freestyle libre 2 CGM -with alarms -sent prescription to his pharmacy. -At last visit, we decreased the dose of his Toujeo because he was dropping his sugars too much overnight, even to the 40s.  His sugars were increasing after each meal so we strengthened his insulin to carb ratio at last visit.  His glucose management indicator at last visit was 7.0%, at goal. -Since he has type I, rather than type 2 diabetes, he is not a good candidate for SGLT2 inhibitors (he also has muscle cramps and dehydration).  However, if his insurance covers it, he may be a  candidate for off label use of GLP-1 receptor agonist, due to his cardiovascular history. -At this visit, we reviewed together his freestyle libre CGM download and it appears that his sugars are not dropping as low overnight or increasing as much after breakfast, but they still increase midday and around dinnertime.  Upon questioning, he is eating snacks or his meals and he forgets to bolus for these until after he eats.  When he actually gets to bolus for them, the sugars already started to decrease and he may have an abrupt drop in blood sugars afterwards.  He did not have significant lows since last visit, though. -We discussed about the absolute importance of bolusing 15 min before every meal.  Ideally, he would also bolus before snacks, however, is planning to start eliminating snacks and only keep 3 meals a day.  This will greatly help. -We also discussed about Lyumjev, as faster onset Humalog and he will let me know when he finishes his supply of Humalog so I can send this new insulin to his pharmacy.  We discussed that this can be injected at the start of the meal, rather than 15 min before. -Otherwise, I do not feel that we need to make any changes in his regimen for now. - I suggested to:  Patient Instructions  Please continue: - Toujeo 18 units in a.m. - Humalog: ICR:  - 1:10 - 1:8 with starches: rice, pasta, potatoes, etc. Target: 120 Sensitivity factor: 1:40  Please do the following approximately 15 minutes before every meal: - count carbs (C) - check sugars (S) - inject insulin (I)  Please return in 3-4 months with your sugar log.   - we checked his HbA1c: 7.5% (lower) - advised to check sugars at different times of the day - 3-4x a day, rotating check times - advised for yearly eye exams >> he is not UTD - will check annual labs today - return to clinic in 3-4 months  2. HL -Reviewed latest lipid panel from 2020: LDL at goal, lower than 70, HDL slightly low -Continues  high-dose Crestor without side effects -He is due for another lipid panel >> we will check today  Component     Latest Ref Rng & Units 10/03/2019          Sodium     135 - 145 mEq/L 140  Potassium     3.5 - 5.1 mEq/L 4.4  Chloride     96 - 112 mEq/L 103  CO2     19 - 32 mEq/L 31  Glucose     70 - 99 mg/dL 103 (H)  BUN     6 - 23 mg/dL 17  Creatinine     0.40 - 1.50 mg/dL 0.85  Total Bilirubin     0.2 - 1.2 mg/dL 0.5  Alkaline Phosphatase     39 - 117 U/L 45  AST     0 - 37 U/L 24  ALT     0 - 53 U/L 20  Total Protein     6.0 - 8.3 g/dL 7.6  Albumin     3.5 - 5.2 g/dL 4.5  GFR     >60.00 mL/min 92.04  Calcium     8.4 - 10.5 mg/dL 9.6  Cholesterol     0 - 200 mg/dL 114  Triglycerides     0 - 149 mg/dL 42.0  HDL Cholesterol     >  39.00 mg/dL 41.40  VLDL     0.0 - 40.0 mg/dL 8.4  LDL (calc)     0 - 99 mg/dL 64  Total CHOL/HDL Ratio      3  NonHDL      72.21  Microalb, Ur     0.0 - 1.9 mg/dL 1.0  Creatinine,U     mg/dL 69.1  MICROALB/CREAT RATIO     0.0 - 30.0 mg/g 1.5  TSH     0.35 - 4.50 uIU/mL 1.53   Excellent labs.  Philemon Kingdom, MD PhD [facility] Endocrinology

## 2019-10-07 ENCOUNTER — Telehealth: Payer: Self-pay | Admitting: *Deleted

## 2019-10-07 NOTE — Telephone Encounter (Signed)
I called patient for his Coordinate-Diabetes Study 9 month phone call. I left message for patient to call us back for his phone visit.

## 2019-10-16 ENCOUNTER — Telehealth: Payer: Self-pay | Admitting: *Deleted

## 2019-10-16 NOTE — Telephone Encounter (Signed)
I left message for patient to call me back for Coordinate-Diabetes Study. This visit is 30-month follow-up visit.

## 2019-10-23 ENCOUNTER — Telehealth: Payer: Self-pay | Admitting: *Deleted

## 2019-10-23 NOTE — Telephone Encounter (Signed)
I left message for patient to call me back for 15-month Coordinate-Diabetes Study.

## 2019-10-29 ENCOUNTER — Encounter: Payer: Self-pay | Admitting: *Deleted

## 2019-10-29 DIAGNOSIS — Z006 Encounter for examination for normal comparison and control in clinical research program: Secondary | ICD-10-CM

## 2019-10-29 NOTE — Research (Signed)
Marland KitchenMarland KitchenMarland KitchenMarland KitchenMarland KitchenMarland KitchenMarland Kitchen   COORDINATE-Diabetes 9 Month Patient Questionnaire (Intervention) Site #:   161   Patient ID:           [id number]           MEDICATIONS  Medication Are you currently taking [insert name of previous med]? If currently taking: If started since last visit: If stopped since last visit:  ACE Inhibitor / Angiotensin Receptor Blocker (ARB) / Angiotensin Receptor Neprilysin inhibitor (ARNi) [] No >  [x] Yes > [] Stopped since last visit [] Never prescribed [] Started since last visit [x] Same medication as last     visit [] Different medication than       last visit [Study personnel to answer]  Documented in EHR? [] No  [x] Yes   If no, Verified by: [] Photo of bottle [] Copy of rx [] Dispensing       pharmacy [] Prescribing provider [] Other (specify):                    Date started:        / /             MM DD YYYY Who prescribed this medication for you? [] Cardiology provider > [] Study clinic [] Outside clinic [] Endocrinology provider [] Primary care provider [] Other provider > Specify:                             [] Unknown Date discontinued:        / /             MM DD YYYY Why did you stop taking this medication? (check all that apply) [] Allergic reaction [] Medication side effects [] Unable to       adhere/monitor [] Had an      operation/procedure      that required      stopping it [] Unable to afford it [] No longer wants        to take        this medication [] Provider decision [] Pregnancy [] Other (specify: ) [] Unknown Reason   If started or changed  What medication are you taking now? [] Benazepril (Lotensin) [] Captopril (Capoten) [] Enalapril (Vasotec) [] Fosinopril (Monopril) [] Lisinopril (Zestril, Prinivil) [] Quinapril (Accupril) [] Ramipril (Altace) [] Azilsartan (Edarbi) [] Candesartan (Atacand) [] Irbesartan (Avapro) [] Losartan (Cozaar) [] Olmesartan (Benicar) [] Telmisartan (Micardis) [] Valsartan (Diovan) [] Sacrubitril/Valsartan  Delene Loll)      Medication Are you currently taking [insert name of previous med]? If currently taking: If started since last visit: If stopped since last visit:  Statin []  No >  [x] Yes > [] Stopped since last visit [] Never prescribed [] Started since last visit [x] Same medication as last        visit [] Different medication or dose     than last visit [Study personnel to answer]  Documented in EHR? [] No [x] Yes    If no, Verified by: [] Photo of bottle [] Copy of rx [] Dispensing pharmacy [] Prescribing provider [] Other (specify):                    Date started:        / /             MM DD YYYY Who prescribed this medication for you? [] Cardiology provider  [] Study clinic [] Outside clinic [] Endocrinology provider [] Primary care provider [] Other provider  Specify:                             [] Unknown Date discontinued:        / /  MM DD YYYY Why did you stop taking this medication? (check all that apply) [] Allergic reaction [] Medication side effects  [] Muscle aches [] Weakness [] Joint pain [] Cognitive symptoms [] Other (specify):                          [] Unable to        adhere/monitor [] Had an    operation/procedure    that required stopping it [] Unable to afford it [] No longer wants       to take this            medication [] Provider decision [] Pregnancy [] Other (specify: ) [] Unknown Reason   If started or changed  What medication are you taking now? [] Atorvastatin (Lipitor) [] Fluvastatin (Lescol) [] Lovastatin (Mevacor) [] Pravastatin (Pravachol) [] Rosuvastatin (Crestor) [] Simvastatin (Zocor) [] Pitatavastatin (Livalo)  Dose: [] 1 mg  [] 10 mg [] 2 mg  []  20 mg [] 3 mg  []  40 mg [] 4 mg  []  60 mg [] 5 mg  []  80 mg  Frequency: [] Daily []  Less than daily      Medication Are you currently taking [insert name of previous med]? If currently taking: If started since last visit: If stopped since last visit:  SGLT2 Inhibitor [x] No    [] Yes [] Stopped since last visit [] Never prescribed [] Started since last visit [] Same medication as last      visit [] Different medication than last visit [Study personnel to answer]  Documented in EHR? [] No [] Yes   If no, Verified by: [] Photo of bottle [] Copy of rx [] Dispensing       pharmacy [] Prescribing       provider [] Other (specify):                    Date started:        / /             MM DD YYYY Who prescribed this medication for you? [] Cardiology provider  [] Study clinic [] Outside clinic [] Endocrinology provider [] Primary care provider [] Other provider  Specify:                             [] Unknown Date discontinued:        / /             MM DD YYYY Why did you stop taking this medication? (check all that apply) [] Allergic reaction [] Medication side effects [] Unable to       adhere/monitor [] Had an        operation/procedure         that required           stopping it [] Patient unable to       afford it [] Patient no longer       wants to       take this medication [] Provider decision [] Pregnancy [] Other (specify: ) [x] Unknown Reason   If started or changed  What medication are you taking now? [] Canaglifozin (Invokana) [] Dapagliflozin (Farxiga) [] Empaglifozin (Jardiance) [] Ertugliflozin (Steglatro)     GLP1 Receptor Agonist [x] No   [] Yes [] Stopped since last visit [] Never prescribed [] Started since last visit [] Same medication as last      visit [] Different medication than       last visit [Study personnel to answer]  Documented in EHR? [] No [] Yes   If no, Verified by: [] Photo of bottle [] Copy of rx [] Dispensing       pharmacy [] Prescribing       provider [] Other (specify):  Date started:        / /             MM DD YYYY Who prescribed this medication for you? [] Cardiology provider  [] Study clinic [] Outside clinic [] Endocrinology provider [] Primary care provider [] Other provider  Specify:                              [] Unknown Date discontinued:        / /             MM DD YYYY Why did you stop taking this medication? (check all that apply) [] Allergic reaction [] Medication side effects [] Unable to      adhere/monitor [] Had an    operation/procedure    that required stopping it [] Patient unable to        afford it [] Patient no longer        wants to take this        medication [] Provider decision [] Pregnancy [] Other (specify: ) [x] Unknown Reason   If started or changed  What medication are you taking now? [] Albiglutide (Tanzeum) [] Dulaglutide (Trulicity) [] Exanatide (Byetta, Bydureon) [] Liraglutide (Victoza, Saxenda) [] Lixisenatide (Adlyxin) [] Semaglutice (Ozempic)     06/11/2018 ([facility] Release)

## 2019-11-19 ENCOUNTER — Other Ambulatory Visit: Payer: Self-pay | Admitting: Internal Medicine

## 2019-11-19 ENCOUNTER — Other Ambulatory Visit: Payer: Self-pay | Admitting: Cardiology

## 2019-11-19 NOTE — Telephone Encounter (Signed)
Rx has been sent to the pharmacy electronically. ° °

## 2019-12-02 ENCOUNTER — Telehealth: Payer: Self-pay | Admitting: *Deleted

## 2019-12-02 DIAGNOSIS — Z006 Encounter for examination for normal comparison and control in clinical research program: Secondary | ICD-10-CM

## 2019-12-02 NOTE — Telephone Encounter (Signed)
I called patient for 12-month Coordinate-Diabetes Study.I left message for patient to call me back. 

## 2019-12-11 ENCOUNTER — Telehealth: Payer: Self-pay | Admitting: *Deleted

## 2019-12-11 DIAGNOSIS — Z006 Encounter for examination for normal comparison and control in clinical research program: Secondary | ICD-10-CM

## 2019-12-11 NOTE — Telephone Encounter (Signed)
I called patient for 12 -month phone call for Coordinate-Diabetes Study. I left message for patient to call me back.

## 2020-01-09 LAB — HM DIABETES EYE EXAM

## 2020-01-19 ENCOUNTER — Ambulatory Visit: Payer: Commercial Managed Care - PPO | Admitting: Internal Medicine

## 2020-01-19 NOTE — Progress Notes (Deleted)
Patient ID: Kelly Hardy, male   DOB: Feb 14, 1961, 59 y.o.   MRN: [id number]   This visit occurred during the SARS-CoV-2 public health emergency.  Safety protocols were in place, including screening questions prior to the visit, additional usage of staff PPE, and extensive cleaning of exam room while observing appropriate contact time as indicated for disinfecting solutions.   HPI: Kelly Hardy is a 59 y.o.-year-old male, referred by his cardiologist, Dr. Percival Spanish, for management of DM type I, initially dx in 1993 as DM type II, insulin-dependent since 1994, uncontrolled, with complications (CAD - s/p DES 2017, CABG x1 2019; PN). He saw Dr. Forde Dandy until few years ago.  Last visit with me 3.5 months ago.  Reviewed HbA1c levels: Lab Results  Component Value Date   HGBA1C 7.5 (A) 10/03/2019   HGBA1C 7.8 (A) 07/18/2019   HGBA1C 8.1 (A) 04/14/2019   HGBA1C 8.5 (H) 04/02/2017  10/11/2018: HbA1c 8.3% 10/19/2017: HbA1c 8.5%  Pt was on a regimen of: - Humalog 75/25 15 units 2x a day before meals - Humalog 2-8 units 3x a day, before meals - per sliding scale: ISF 50, target 120, ICR ~1:20  Currently on: - Toujeo 20 >> 18 units in a.m. - Humalog: ICR:  - 1:10 - 1:8 with starches: rice, pasta, potatoes, etc. Target: 120 Sensitivity factor: 1: 40  He checks his sugars more than 4 times a day with his freestyle libre 2 CGM.  Freestyle libre CGM parameters: - Average: 154 >> 164 - % active CGM time:  95% >> 92% of the time - Glucose variability 41.6% >> 38.6% (target < or = to 36%) - time in range:  - very low (<54): 4% >> 1% - low (54-69): 7% >> 4% - normal range (70-180): 54% >> 57% - high sugars (181-250): 27% >> 29% - very high sugars (>250): 8% >> 9%  Previously:    Previously:   Lowest sugar was 52 >> 45 >> 39 >> 40 (libre CGM); he has hypoglycemia awareness in the 70s.  Highest sugar was 311 >> 300s >> 393 >> 300.  Glucometer: One Touch Verio  Pt's meals are: -  Breakfast: egg sandwich or whole wheat bread + mayo, coffee - Lunch: microwaveable lunch, chicken sandwich, PB crackers or something else light - Dinner: sandwich with mayo + lean meat: [place] or ham, no veggies usually - Snacks: crackers, almonds at night He is walking for exercise.  He tried more intense exercise but had severe muscle aches.  No CKD, last BUN/creatinine:  Lab Results  Component Value Date   BUN 17 10/03/2019   BUN 10 04/07/2017   CREATININE 0.85 10/03/2019   CREATININE 0.80 04/07/2017   Lab Results  Component Value Date   MICRALBCREAT 1.5 10/03/2019   10/11/2018: 13/0.94, GFR 89, glucose 271, ACR 8 10/19/2017: 16/0.92, glucose 302, ACR <3.7 On Cozaar 25.  + HL, last lipid panel: Lab Results  Component Value Date   CHOL 114 10/03/2019   HDL 41.40 10/03/2019   LDLCALC 64 10/03/2019   TRIG 42.0 10/03/2019   CHOLHDL 3 10/03/2019  10/11/2018: 106/41/36/62 10/19/2017: 112/46/41/62 On Crestor 40.  - last eye exam was in 09/2018: No DR  - + numbness and tingling -under his toes.  He also has occasional leg cramps  On ASA 81.  She also has a history of HTN.  Pt has FH of DM in daughter - type 1 - dx. At 59 y/o. Patient's wife had breast cancer.  ROS: Constitutional:  no weight gain/no weight loss, no fatigue, no subjective hyperthermia, no subjective hypothermia Eyes: no blurry vision, no xerophthalmia ENT: no sore throat, no nodules palpated in neck, no dysphagia, no odynophagia, no hoarseness Cardiovascular: no CP/no SOB/no palpitations/no leg swelling Respiratory: no cough/no SOB/no wheezing Gastrointestinal: no N/no V/no D/no C/no acid reflux Musculoskeletal: no muscle aches/no joint aches, + occasional leg cramps Skin: no rashes, no hair loss Neurological: no tremors/+ numbness/+ tingling/no dizziness  I reviewed pt's medications, allergies, PMH, social hx, family hx, and changes were documented in the history of present illness. Otherwise, unchanged  from my initial visit note.  Past Medical History:  Diagnosis Date  . Coronary artery disease   . Diabetes mellitus without complication (HCC)    Type I  . GERD (gastroesophageal reflux disease)   . Hyperlipidemia   . Hypertension   . Palpitations    eval 2014   Past Surgical History:  Procedure Laterality Date  . CARDIAC CATHETERIZATION N/A 02/10/2016   Procedure: Left Heart Cath and Coronary Angiography;  Surgeon: [name], MD;  Location: [place] [facility];  Service: Cardiovascular;  Laterality: N/A;  . CARDIAC CATHETERIZATION N/A 02/10/2016   Procedure: Coronary Stent Intervention;  Surgeon: [name], MD;  Location: [place] [facility];  Service: Cardiovascular;  Laterality: N/A;  . CORONARY ARTERY BYPASS GRAFT N/A 04/04/2017   Procedure: CORONARY ARTERY BYPASS GRAFTING (CABG), times one, using left internal mammary artery. Off pump;  Surgeon: Melrose Nakayama, MD;  Location: [place];  Service: Open Heart Surgery;  Laterality: N/A;  . CORONARY STENT PLACEMENT  02/10/2016   STENT XIENCE ALPINE RX 3.25X15 drug eluting stent was successfully placed  . LEFT HEART CATH AND CORONARY ANGIOGRAPHY N/A 03/23/2017   Procedure: LEFT HEART CATH AND CORONARY ANGIOGRAPHY;  Surgeon: Martinique, Peter M, MD;  Location: [place] [facility];  Service: Cardiovascular;  Laterality: N/A;  . SALIVARY GLAND SURGERY Left    benign  . TEE WITHOUT CARDIOVERSION N/A 04/04/2017   Procedure: TRANSESOPHAGEAL ECHOCARDIOGRAM (TEE);  Surgeon: Melrose Nakayama, MD;  Location: [facility];  Service: Open Heart Surgery;  Laterality: N/A;   Social History   Socioeconomic History  . Marital status: Married    Spouse name: Not on file  . Number of children: 2  . Years of education: Not on file  . Highest education level: Not on file  Occupational History  . Occupation: Freight forwarder at [employer]   Social Needs  . Financial resource strain: Not on file  . Food insecurity    Worry: Not on file    Inability:  Not on file  . Transportation needs    Medical: Not on file    Non-medical: Not on file  Tobacco Use  . Smoking status: Never Smoker  . Smokeless tobacco: Never Used  Substance and Sexual Activity  . Alcohol use: No  . Drug use: No   Current Outpatient Medications on File Prior to Visit  Medication Sig Dispense Refill  . acetaminophen (TYLENOL) 500 MG tablet Take 2 tablets (1,000 mg total) by mouth every 8 (eight) hours as needed. 30 tablet 0  . aspirin EC 81 MG tablet Take 81 mg by mouth daily.    . Continuous Blood Gluc Receiver (FREESTYLE LIBRE 2 READER) DEVI 1 each by Does not apply route daily. 1 each 0  . Continuous Blood Gluc Sensor (FREESTYLE LIBRE 2 SENSOR) MISC 1 each by Does not apply route every 14 (fourteen) days. 6 each 3  .  Glucagon 3 MG/DOSE POWD Place 3 mg into the nose once as needed for up to 1 dose. 1 each 11  . insulin lispro (HUMALOG KWIKPEN) 100 UNIT/ML KwikPen Inject 0.02-0.1 mLs (2-10 Units total) into the skin 3 (three) times daily. 15 mL 3  . Insulin Pen Needle 32G X 4 MM MISC Use 4-6x a day 400 each 3  . losartan (COZAAR) 25 MG tablet TAKE ONE TABLET BY MOUTH DAILY. 90 tablet 3  . metoprolol succinate (TOPROL-XL) 50 MG 24 hr tablet TAKE ONE TABLET BY MOUTH DAILY. 90 tablet 1  . Multiple Vitamins-Minerals (MULTIVITAMIN WITH MINERALS) tablet Take 1 tablet by mouth daily.    Marland Kitchen oxyCODONE (OXY IR/ROXICODONE) 5 MG immediate release tablet Take 1 tablet (5 mg total) by mouth every 4 (four) hours as needed for severe pain. 30 tablet 0  . rosuvastatin (CRESTOR) 40 MG tablet TAKE ONE TABLET BY MOUTH AT BEDTIME. 30 tablet 10  . TOUJEO SOLOSTAR 300 UNIT/ML Solostar Pen INJECT 18-20 UNITS INTO THE SKIN AT BEDTIME. (BOX MUST LAST 66 DAYS PER INSURANCE) 4.5 mL 2   No current facility-administered medications on file prior to visit.   No Known Allergies Family History  Problem Relation Age of Onset  . Coronary artery disease Mother 63       Died age 16, CABG  . CAD  Father 51       Died age 1, CABG  . CAD Brother 61       Stents  . Heart failure Maternal Grandmother     PE: There were no vitals taken for this visit. Wt Readings from Last 3 Encounters:  10/03/19 154 lb (69.9 kg)  07/18/19 155 lb 3.2 oz (70.4 kg)  07/18/19 152 lb (68.9 kg)   Constitutional: normal weight, in NAD Eyes: PERRLA, EOMI, no exophthalmos ENT: moist mucous membranes, no thyromegaly, no cervical lymphadenopathy Cardiovascular: RRR, No MRG Respiratory: CTA B Gastrointestinal: abdomen soft, NT, ND, BS+ Musculoskeletal: no deformities, strength intact in all 4 Skin: moist, warm, no rashes Neurological: no tremor with outstretched hands, DTR normal in all 4  ASSESSMENT: 1. DM1, insulin-dependent, uncontrolled, with complications - CAD - s/p DES 2017, CABG x1 04/2017 - PN  We diagnosed type 1 diabetes based on low peptide suggesting insulin deficiency: Component     Latest Ref Rng & Units 12/31/2018  ZNT8 Antibodies     U/mL <15  Glutamic Acid Decarb Ab     <5 IU/mL <5  C-Peptide     0.80 - 3.85 ng/mL <0.10 (L)  Glucose, Plasma     65 - 99 mg/dL 94  POC Glucose     70 - 99 mg/dl 132 (A)  Islet Cell Ab     Neg:<1:1 Negative   2. HL  PLAN:  1. Patient with longstanding, uncontrolled, insulin-dependent diabetes (type I-diagnosed in 12/2018 -due to low insulin production).  His sugars are highly fluctuating, consistent with insulin deficiency.  He is currently on a basal/bolus insulin regimen, with improved control at last visit especially after starting to use the freestyle libre CGM.  At last visit, I sent the prescription for the freestyle libre 2 CGM to his pharmacy so that he could benefit from the alarms. -Since he has type I rather than type 2 diabetes, he is not a good candidate for SGLT2 inhibitor (he also has muscle cramps and dehydration).  However, we discussed at last visit that he may be a candidate for off label use of GLP-1 receptor agonist due  to  his cardiovascular history. -At last visit, reviewing his CGM download, it appeared that his sugars were not dropping as low overnight as before or increasing as much after breakfast, but they were still increasing midday and around dinnertime.  Upon questioning, he was eating snacks or even whole meals forgetting to bolus before these until after he was eating them.  With discussed about the absolute importance of moving the bolus 15 minutes before each meal or snack.  We also discussed about starting Lyumjev, which is faster onset Humalog and I advised him to let me know when he finished his supply of Humalog so I can send a new prescription to his pharmacy.  He would benefit from this insulin as it is injected at the start of the meal, rather than 15 minutes before.  We did not change his regimen at last visit.  HbA1c then was 7.5%, improved. CGM interpretation:  - I suggested to:  Patient Instructions  Please continue: - Toujeo 18 units in a.m. - Humalog: ICR:  - 1:10 - 1:8 with starches: rice, pasta, potatoes, etc. Target: 120 Sensitivity factor: 1:40  Please do the following approximately 15 minutes before every meal: - count carbs (C) - check sugars (S) - inject insulin (I)  Please return in 3-4 months with your sugar log.   - we checked his HbA1c: 7%  - advised to check sugars at different times of the day - 4x a day, rotating check times - advised for yearly eye exams >> he is UTD - return to clinic in 3-4 months  2. HL - Reviewed latest lipid panel from 10/2019: All fractions at goal: Lab Results  Component Value Date   CHOL 114 10/03/2019   HDL 41.40 10/03/2019   LDLCALC 64 10/03/2019   TRIG 42.0 10/03/2019   CHOLHDL 3 10/03/2019  -Continues on high-dose Crestor without side effects   Philemon Kingdom, MD PhD [facility]

## 2020-01-29 ENCOUNTER — Telehealth: Payer: Self-pay | Admitting: *Deleted

## 2020-01-29 DIAGNOSIS — Z006 Encounter for examination for normal comparison and control in clinical research program: Secondary | ICD-10-CM

## 2020-01-29 NOTE — Telephone Encounter (Signed)
I left message for patient to call me for 73-month phone call for Coordinate-Diabetes Study.Kelly Hardy

## 2020-02-05 ENCOUNTER — Telehealth: Payer: Self-pay | Admitting: *Deleted

## 2020-02-05 DIAGNOSIS — Z006 Encounter for examination for normal comparison and control in clinical research program: Secondary | ICD-10-CM

## 2020-02-05 NOTE — Telephone Encounter (Signed)
I called patient for 12-month Coordinate-Diabetes Study.I left message for patient to call me back. 

## 2020-02-12 ENCOUNTER — Other Ambulatory Visit: Payer: Self-pay | Admitting: Internal Medicine

## 2020-03-05 ENCOUNTER — Other Ambulatory Visit: Payer: Self-pay | Admitting: Internal Medicine

## 2020-03-09 ENCOUNTER — Encounter: Payer: Self-pay | Admitting: Internal Medicine

## 2020-03-09 ENCOUNTER — Ambulatory Visit (INDEPENDENT_AMBULATORY_CARE_PROVIDER_SITE_OTHER): Payer: Commercial Managed Care - PPO | Admitting: Internal Medicine

## 2020-03-09 ENCOUNTER — Other Ambulatory Visit: Payer: Self-pay

## 2020-03-09 VITALS — BP 140/82 | HR 70 | Ht 69.0 in | Wt 156.7 lb

## 2020-03-09 DIAGNOSIS — E1059 Type 1 diabetes mellitus with other circulatory complications: Secondary | ICD-10-CM

## 2020-03-09 DIAGNOSIS — E7800 Pure hypercholesterolemia, unspecified: Secondary | ICD-10-CM

## 2020-03-09 DIAGNOSIS — Z23 Encounter for immunization: Secondary | ICD-10-CM | POA: Diagnosis not present

## 2020-03-09 LAB — POCT GLYCOSYLATED HEMOGLOBIN (HGB A1C): Hemoglobin A1C: 7.7 % — AB (ref 4.0–5.6)

## 2020-03-09 MED ORDER — LYUMJEV KWIKPEN 100 UNIT/ML ~~LOC~~ SOPN: PEN_INJECTOR | SUBCUTANEOUS | 3 refills | Status: DC

## 2020-03-09 MED ORDER — GLUCAGON 3 MG/DOSE NA POWD: 3.0000 mg | Freq: Once | NASAL | 11 refills | Status: DC | PRN

## 2020-03-09 MED ORDER — TOUJEO SOLOSTAR 300 UNIT/ML ~~LOC~~ SOPN
PEN_INJECTOR | SUBCUTANEOUS | 3 refills | Status: DC
Start: 2020-03-09 — End: 2020-06-25

## 2020-03-09 NOTE — Patient Instructions (Addendum)
Please continue: - Toujeo 18 units in a.m.  Please change: - Humalog to Lyumjev  Change: ICR:  - 1:8, except 1:10 for lunch - 1:6 with starches: rice, pasta, potatoes, etc.  Continue: Target: 120 Sensitivity factor: 1:40  Please do the following approximately before every meal: - count carbs (C) - check sugars (S) - inject insulin (I)  Please return in 3-4 months with your sugar log.

## 2020-03-09 NOTE — Progress Notes (Signed)
Patient ID: Kelly Hardy, male   DOB: [birth year], 59 y.o.   MRN: [id number]   This visit occurred during the SARS-CoV-2 public health emergency.  Safety protocols were in place, including screening questions prior to the visit, additional usage of staff PPE, and extensive cleaning of exam room while observing appropriate contact time as indicated for disinfecting solutions.   HPI: Kelly Hardy is a 59 y.o.-year-old male, referred by his cardiologist, Dr. Percival Spanish, for management of DM type I, initially dx in 1993 as DM type II, insulin-dependent since 1994, uncontrolled, with complications (CAD - s/p DES 2017, CABG x1 2019; PN). He saw Dr. Forde Dandy until few years ago.  Last visit with me 5 months ago.  Sugars are more variable during Thanksgiving.  Reviewed HbA1c levels: Lab Results  Component Value Date   HGBA1C 7.5 (A) 10/03/2019   HGBA1C 7.8 (A) 07/18/2019   HGBA1C 8.1 (A) 04/14/2019   HGBA1C 8.5 (H) 04/02/2017  10/11/2018: HbA1c 8.3% 10/19/2017: HbA1c 8.5%  Pt was on a regimen of: - Humalog 75/25 15 units 2x a day before meals - Humalog 2-8 units 3x a day, before meals - per sliding scale: ISF 50, target 120, ICR ~1:20  Currently on: - Toujeo 20 >> 18 units in a.m. - Humalog: ICR:  - 1:10 - 1:8 with starches: rice, pasta, potatoes, etc. Target: 120 Sensitivity factor: 1:40  He checks his sugars more than 4 times a day with his freestyle libre CGM.  Freestyle libre CGM parameters: - Average: 154 >> 164 >> 179 - % active CGM time:  95% >> 92% >> 98% of the time - Glucose variability 41.6% >> 38.6% >> 37.5% (target < or = to 36%) - Glucose management indicator: 7.6% - time in range:  - very low (<54): 4% >> 1% >> 0% - low (54-69): 7% >> 4% >> 1% - normal range (70-180): 54% >> 57% >> 53% - high sugars (181-250): 27% >> 29% >> 27% - very high sugars (>250): 8% >> 9% >> 19%     Previously:   Previously:   Lowest sugar was 39 >> 40 (libre CGM) >> 53; he has  hypoglycemia awareness in the 70s Highest sugar was 393 >> 300 >> 300s.  Glucometer: One Touch Verio  Pt's meals are: - Breakfast: egg sandwich or whole wheat bread + mayo, coffee - Lunch: microwaveable lunch, chicken sandwich, PB crackers or something else light - Dinner: sandwich with mayo + lean meat: [place] or ham, no veggies usually - Snacks: crackers, almonds at night He is walking for exercise.  She tried more intense exercise but had severe muscle aches.  -No CKD, last BUN/creatinine:  Lab Results  Component Value Date   BUN 17 10/03/2019   BUN 10 04/07/2017   CREATININE 0.85 10/03/2019   CREATININE 0.80 04/07/2017  10/11/2018: 13/0.94, GFR 89, glucose 271, ACR 8 10/19/2017: 16/0.92, glucose 302, ACR <3.7 On Cozaar 25.  -+ HL; last lipid panel: Lab Results  Component Value Date   CHOL 114 10/03/2019   HDL 41.40 10/03/2019   LDLCALC 64 10/03/2019   TRIG 42.0 10/03/2019   CHOLHDL 3 10/03/2019  10/11/2018: 106/41/36/62 10/19/2017: 112/46/41/62 On Crestor 40.  - last eye exam was in 01/2020: No DR  -He has numbness and tingling in his toes.  + Occasional leg cramps.  He is on ASA 81.  He also has a history of HTN.  Pt has FH of DM in daughter - type 1 - dx. At 12  y/o. Patient's wife had breast cancer.  ROS: Constitutional: no weight gain/no weight loss, no fatigue, no subjective hyperthermia, no subjective hypothermia Eyes: no blurry vision, no xerophthalmia ENT: no sore throat, no nodules palpated in neck, no dysphagia, no odynophagia, no hoarseness Cardiovascular: no CP/no SOB/no palpitations/no leg swelling Respiratory: no cough/no SOB/no wheezing Gastrointestinal: no N/no V/no D/no C/no acid reflux Musculoskeletal: no muscle aches/no joint aches Skin: no rashes, no hair loss Neurological: no tremors/no numbness/no tingling/no dizziness  I reviewed pt's medications, allergies, PMH, social hx, family hx, and changes were documented in the history of present  illness. Otherwise, unchanged from my initial visit note.  Past Medical History:  Diagnosis Date  . Coronary artery disease   . Diabetes mellitus without complication (HCC)    Type I  . GERD (gastroesophageal reflux disease)   . Hyperlipidemia   . Hypertension   . Palpitations    eval 2014   Past Surgical History:  Procedure Laterality Date  . CARDIAC CATHETERIZATION N/A 02/10/2016   Procedure: Left Heart Cath and Coronary Angiography;  Surgeon: [name], MD;  Location: [facility];  Service: Cardiovascular;  Laterality: N/A;  . CARDIAC CATHETERIZATION N/A 02/10/2016   Procedure: Coronary Stent Intervention;  Surgeon: [name], MD;  Location: [place] [facility];  Service: Cardiovascular;  Laterality: N/A;  . CORONARY ARTERY BYPASS GRAFT N/A 04/04/2017   Procedure: CORONARY ARTERY BYPASS GRAFTING (CABG), times one, using left internal mammary artery. Off pump;  Surgeon: Melrose Nakayama, MD;  Location: [place];  Service: Open Heart Surgery;  Laterality: N/A;  . CORONARY STENT PLACEMENT  02/10/2016   STENT XIENCE ALPINE RX 3.25X15 drug eluting stent was successfully placed  . LEFT HEART CATH AND CORONARY ANGIOGRAPHY N/A 03/23/2017   Procedure: LEFT HEART CATH AND CORONARY ANGIOGRAPHY;  Surgeon: Martinique, Peter M, MD;  Location: [place] [facility];  Service: Cardiovascular;  Laterality: N/A;  . SALIVARY GLAND SURGERY Left    benign  . TEE WITHOUT CARDIOVERSION N/A 04/04/2017   Procedure: TRANSESOPHAGEAL ECHOCARDIOGRAM (TEE);  Surgeon: Melrose Nakayama, MD;  Location: [place];  Service: Open Heart Surgery;  Laterality: N/A;   Social History   Socioeconomic History  . Marital status: Married    Spouse name: Not on file  . Number of children: 2  . Years of education: Not on file  . Highest education level: Not on file  Occupational History  . Occupation: Freight forwarder at [employer]   Social Needs  . Financial resource strain: Not on file  . Food insecurity     Worry: Not on file    Inability: Not on file  . Transportation needs    Medical: Not on file    Non-medical: Not on file  Tobacco Use  . Smoking status: Never Smoker  . Smokeless tobacco: Never Used  Substance and Sexual Activity  . Alcohol use: No  . Drug use: No   Current Outpatient Medications on File Prior to Visit  Medication Sig Dispense Refill  . acetaminophen (TYLENOL) 500 MG tablet Take 2 tablets (1,000 mg total) by mouth every 8 (eight) hours as needed. 30 tablet 0  . aspirin EC 81 MG tablet Take 81 mg by mouth daily.    . Continuous Blood Gluc Receiver (FREESTYLE LIBRE 2 READER) DEVI 1 each by Does not apply route daily. 1 each 0  . Continuous Blood Gluc Sensor (FREESTYLE LIBRE 2 SENSOR) MISC 1 each by Does not apply route every 14 (  fourteen) days. 6 each 3  . EASY COMFORT PEN NEEDLES 31G X 5 MM MISC USE AS DIRECTED FOUR TO SIX TIMES DAILY 400 each 3  . Glucagon 3 MG/DOSE POWD Place 3 mg into the nose once as needed for up to 1 dose. 1 each 11  . HUMALOG KWIKPEN 100 UNIT/ML KwikPen INJECT 2-10 UNITS INTO THE SKIN THREE TIMES DAILY 15 mL 3  . Insulin Pen Needle 32G X 4 MM MISC Use 4-6x a day 400 each 3  . losartan (COZAAR) 25 MG tablet TAKE ONE TABLET BY MOUTH DAILY. 90 tablet 3  . metoprolol succinate (TOPROL-XL) 50 MG 24 hr tablet TAKE ONE TABLET BY MOUTH DAILY. 90 tablet 1  . Multiple Vitamins-Minerals (MULTIVITAMIN WITH MINERALS) tablet Take 1 tablet by mouth daily.    Marland Kitchen oxyCODONE (OXY IR/ROXICODONE) 5 MG immediate release tablet Take 1 tablet (5 mg total) by mouth every 4 (four) hours as needed for severe pain. 30 tablet 0  . rosuvastatin (CRESTOR) 40 MG tablet TAKE ONE TABLET BY MOUTH AT BEDTIME. 30 tablet 10  . TOUJEO SOLOSTAR 300 UNIT/ML Solostar Pen INJECT 18-20 UNITS INTO THE SKIN AT BEDTIME. (BOX MUST LAST 66 DAYS PER INSURANCE) 4.5 mL 2   No current facility-administered medications on file prior to visit.   No Known Allergies Family History  Problem Relation  Age of Onset  . Coronary artery disease Mother 66       Died age 37, CABG  . CAD Father 35       Died age 63, CABG  . CAD Brother 68       Stents  . Heart failure Maternal Grandmother     PE: BP 140/82   Pulse 70   Ht 5\' 9"  (1.753 m)   Wt 156 lb 11.2 oz (71.1 kg)   SpO2 99%   BMI 23.14 kg/m  Wt Readings from Last 3 Encounters:  03/09/20 156 lb 11.2 oz (71.1 kg)  10/03/19 154 lb (69.9 kg)  07/18/19 155 lb 3.2 oz (70.4 kg)   Constitutional: normal weight, in NAD Eyes: PERRLA, EOMI, no exophthalmos ENT: moist mucous membranes, no thyromegaly, no cervical lymphadenopathy Cardiovascular: RRR, No MRG Respiratory: CTA B Gastrointestinal: abdomen soft, NT, ND, BS+ Musculoskeletal: no deformities, strength intact in all 4 Skin: moist, warm, no rashes Neurological: no tremor with outstretched hands, DTR normal in all 4  ASSESSMENT: 1. DM1, insulin-dependent, uncontrolled, with complications - CAD - s/p DES 2017, CABG x1 04/2017 - PN  We diagnosed type 1 diabetes based on the low C-peptide suggesting insulin deficiency: Component     Latest Ref Rng & Units 12/31/2018  ZNT8 Antibodies     U/mL <15  Glutamic Acid Decarb Ab     <5 IU/mL <5  C-Peptide     0.80 - 3.85 ng/mL <0.10 (L)  Glucose, Plasma     65 - 99 mg/dL 94  POC Glucose     70 - 99 mg/dl 132 (A)  Islet Cell Ab     Neg:<1:1 Negative   2. HL  PLAN:  1. Patient with longstanding, uncontrolled, insulin-dependent diabetes, which we diagnosed as type I in 12/2018, due to low insulin production.  His sugars are highly fluctuating, consistent with insulin deficiency.  At last visit, HbA1c was lower, at 7.5%.  We did not change the regimen at that time.  Per review of his freestyle libre CGM download, his sugars were not dropping as low overnight or increasing as much after breakfast but  they were still higher midday and around dinnertime.  Upon questioning, he was eating between meals and she was forgetting to 4 days  until after he was eating.  We discussed about the importance of bolusing 15 minutes for meals but I also advised him to let me know when he finished his Humalog supplies so we can try Lyumjev, which is shorter onset Humalog and can be injected at the start of the meal rather than 15 minutes before.   CGM interpretation: -At today's visit, we reviewed her CGM downloads: It appears that only 53% of values are in target range, while 46% are higher than 180, and 1% are lower than 70 (he does appear to have less lows compared to last visit).  The calculated average blood sugar is 179, higher than before.  The projected HbA1c for the next 3 months (GMI) is 7.6%. -Upon questioning, he tells me that the last 2 weeks have been more variable for him from the blood sugar point of view due to Thanksgiving. -Reviewing the CGM trends, it appears that his sugars are fluctuating with a significant increase after breakfast when he gets to work.  Upon questioning, he is getting a small snack when he wakes up in the morning and then eats the rest of the breakfast when he gets to work.  He is trying to bolus for these but he does not inject enough insulin.  I advised him to strengthen his insulin to carb ratios with his meals including his starchy meals.  We have to be careful with lunchtime boluses since his sugars usually decrease in the early afternoon.  Therefore, I advised him to keep the insulin to carb ratio at 1: 10 for lunch.  Since sugars increase significantly after dinner, I advised him to use a stronger insulin to carb ratio with dinner, of 1: 8. -We also discussed again about changing to Lyumjev, which will give him more flexibility and I believe better postprandial blood sugar control.  He agrees to do so.  I sent a prescription to his pharmacy. - I suggested to:  Patient Instructions  Please continue: - Toujeo 18 units in a.m.  Please change: - Humalog to Lyumjev  Change: ICR:  - 1:8, except 1:10 for  lunch - 1:6 with starches: rice, pasta, potatoes, etc.  Continue: Target: 120 Sensitivity factor: 1:40  Please do the following approximately before every meal: - count carbs (C) - check sugars (S) - inject insulin (I)  Please return in 3-4 months with your sugar log.    - we checked his HbA1c: 7.7% (higher) - advised to check sugars at different times of the day - 4x a day, rotating check times - advised for yearly eye exams >> he is UTD - return to clinic in 3-4 months  2. HL - Reviewed latest lipid panel from 10/2019: All fractions at goal: Lab Results  Component Value Date   CHOL 114 10/03/2019   HDL 41.40 10/03/2019   LDLCALC 64 10/03/2019   TRIG 42.0 10/03/2019   CHOLHDL 3 10/03/2019  -Continues Crestor 40 without side effects  + Flu shot today  Philemon Kingdom, MD PhD [facility]

## 2020-03-26 ENCOUNTER — Other Ambulatory Visit: Payer: Self-pay | Admitting: Cardiology

## 2020-05-25 ENCOUNTER — Other Ambulatory Visit: Payer: Self-pay | Admitting: Cardiology

## 2020-06-14 ENCOUNTER — Other Ambulatory Visit: Payer: Self-pay | Admitting: Cardiology

## 2020-06-17 ENCOUNTER — Telehealth: Payer: Self-pay | Admitting: *Deleted

## 2020-06-17 NOTE — Telephone Encounter (Signed)
A message was left, re: his follow up visit. 

## 2020-06-25 ENCOUNTER — Other Ambulatory Visit: Payer: Self-pay | Admitting: Internal Medicine

## 2020-07-08 ENCOUNTER — Encounter: Payer: Self-pay | Admitting: Internal Medicine

## 2020-07-09 ENCOUNTER — Encounter: Payer: Self-pay | Admitting: Internal Medicine

## 2020-07-09 ENCOUNTER — Other Ambulatory Visit: Payer: Self-pay

## 2020-07-09 ENCOUNTER — Ambulatory Visit (INDEPENDENT_AMBULATORY_CARE_PROVIDER_SITE_OTHER): Payer: Commercial Managed Care - PPO | Admitting: Internal Medicine

## 2020-07-09 VITALS — BP 140/80 | HR 78 | Ht 69.0 in | Wt 154.2 lb

## 2020-07-09 DIAGNOSIS — E1059 Type 1 diabetes mellitus with other circulatory complications: Secondary | ICD-10-CM | POA: Diagnosis not present

## 2020-07-09 DIAGNOSIS — E7800 Pure hypercholesterolemia, unspecified: Secondary | ICD-10-CM | POA: Diagnosis not present

## 2020-07-09 LAB — POCT GLYCOSYLATED HEMOGLOBIN (HGB A1C): Hemoglobin A1C: 7.4 % — AB (ref 4.0–5.6)

## 2020-07-09 NOTE — Progress Notes (Signed)
Patient ID: Kelly Hardy, male   DOB: October 19, 1960, 60 y.o.   MRN: [id number]   This visit occurred during the SARS-CoV-2 public health emergency.  Safety protocols were in place, including screening questions prior to the visit, additional usage of staff PPE, and extensive cleaning of exam room while observing appropriate contact time as indicated for disinfecting solutions.   HPI: Kelly Hardy is a 60 y.o.-year-old male, initially referred by his cardiologist, Dr. Percival Spanish, returning for follow-up for DM type I, initially dx in 1993 as DM type II, insulin-dependent since 1994, uncontrolled, with complications (CAD - s/p DES 2017, CABG x1 2019; PN). He saw Dr. Forde Dandy until few years ago.  Last visit with me 4 months ago.  Interim history: Since last visit, he saw an improvement in his blood sugars after switching to Lyumjev and changing his ICR. He has no new symptoms.  Denies increased urination, blurry vision, significant weight loss, GI symptoms.  Reviewed HbA1c levels: Lab Results  Component Value Date   HGBA1C 7.7 (A) 03/09/2020   HGBA1C 7.5 (A) 10/03/2019   HGBA1C 7.8 (A) 07/18/2019   HGBA1C 8.1 (A) 04/14/2019   HGBA1C 8.5 (H) 04/02/2017  10/11/2018: HbA1c 8.3% 10/19/2017: HbA1c 8.5%  Pt was on a regimen of: - Humalog 75/25 15 units 2x a day before meals - Humalog 2-8 units 3x a day, before meals - per sliding scale: ISF 50, target 120, ICR ~1:20  Currently on: - Toujeo 20 >> 18 units in a.m. - Humalog >> Lyumjev: ICR:  - 1:10 >> 1:8 - 1:8 >> 1:6  with starches: rice, pasta, potatoes, etc. Target: 120 Sensitivity factor: 1:40  He checks his sugars more than 4 times a day with his freestyle libre CGM.  Freestyle libre CGM parameters:    Prev.:    Previously:   Previously:   Lowest sugar was 39 >> 40 (libre CGM) >> 53 >> 57 x1, OTW 72; he has hypoglycemia awareness in the 80s. Highest sugar was 393 >> 300 >> 300s >> 200s.  Glucometer: One Touch  Verio  Pt's meals are: - Breakfast: egg sandwich or whole wheat bread + mayo, coffee - Lunch: microwaveable lunch, chicken sandwich, PB crackers or something else light - Dinner: sandwich with mayo + lean meat: [place] or ham, no veggies usually - Snacks: crackers, almonds at night He is walking for exercise.  She tried more intense exercise but had severe muscle aches.  -No CKD, last BUN/creatinine:  Lab Results  Component Value Date   BUN 17 10/03/2019   BUN 10 04/07/2017   CREATININE 0.85 10/03/2019   CREATININE 0.80 04/07/2017  10/11/2018: 13/0.94, GFR 89, glucose 271, ACR 8 10/19/2017: 16/0.92, glucose 302, ACR <3.7 On Cozaar 25.  -+ HL; last lipid panel: Lab Results  Component Value Date   CHOL 114 10/03/2019   HDL 41.40 10/03/2019   LDLCALC 64 10/03/2019   TRIG 42.0 10/03/2019   CHOLHDL 3 10/03/2019  10/11/2018: 106/41/36/62 10/19/2017: 112/46/41/62 On Crestor 40.  - last eye exam was in 01/2020: No DR  -He has numbness and tingling in his toes.  He also has occasional leg cramps.  He is on ASA 81.  He also has a history of HTN.  Pt has FH of DM in daughter - type 1 - dx. At 61 y/o. Patient's wife had breast cancer.  ROS: Constitutional: no weight gain/no weight loss, no fatigue, no subjective hyperthermia, no subjective hypothermia Eyes: no blurry vision, no xerophthalmia ENT: no sore throat,  no nodules palpated in neck, no dysphagia, no odynophagia, no hoarseness Cardiovascular: no CP/no SOB/no palpitations/no leg swelling Respiratory: no cough/no SOB/no wheezing Gastrointestinal: no N/no V/no D/no C/no acid reflux Musculoskeletal: no muscle aches/no joint aches Skin: no rashes, no hair loss Neurological: no tremors/no numbness/no tingling/no dizziness  I reviewed pt's medications, allergies, PMH, social hx, family hx, and changes were documented in the history of present illness. Otherwise, unchanged from my initial visit note.  Past Medical History:   Diagnosis Date  . Coronary artery disease   . Diabetes mellitus without complication (HCC)    Type I  . GERD (gastroesophageal reflux disease)   . Hyperlipidemia   . Hypertension   . Palpitations    eval 2014   Past Surgical History:  Procedure Laterality Date  . CARDIAC CATHETERIZATION N/A 02/10/2016   Procedure: Left Heart Cath and Coronary Angiography;  Surgeon: [name], MD;  Location: [place] [facility];  Service: Cardiovascular;  Laterality: N/A;  . CARDIAC CATHETERIZATION N/A 02/10/2016   Procedure: Coronary Stent Intervention;  Surgeon: [name], MD;  Location: [place] [facility];  Service: Cardiovascular;  Laterality: N/A;  . CORONARY ARTERY BYPASS GRAFT N/A 04/04/2017   Procedure: CORONARY ARTERY BYPASS GRAFTING (CABG), times one, using left internal mammary artery. Off pump;  Surgeon: Melrose Nakayama, MD;  Location: [place];  Service: Open Heart Surgery;  Laterality: N/A;  . CORONARY STENT PLACEMENT  02/10/2016   STENT XIENCE ALPINE RX 3.25X15 drug eluting stent was successfully placed  . LEFT HEART CATH AND CORONARY ANGIOGRAPHY N/A 03/23/2017   Procedure: LEFT HEART CATH AND CORONARY ANGIOGRAPHY;  Surgeon: Martinique, Peter M, MD;  Location: [place] [facility];  Service: Cardiovascular;  Laterality: N/A;  . SALIVARY GLAND SURGERY Left    benign  . TEE WITHOUT CARDIOVERSION N/A 04/04/2017   Procedure: TRANSESOPHAGEAL ECHOCARDIOGRAM (TEE);  Surgeon: Melrose Nakayama, MD;  Location: [place];  Service: Open Heart Surgery;  Laterality: N/A;   Social History   Socioeconomic History  . Marital status: Married    Spouse name: Not on file  . Number of children: 2  . Years of education: Not on file  . Highest education level: Not on file  Occupational History  . Occupation: Freight forwarder at [employer]   Social Needs  . Financial resource strain: Not on file  . Food insecurity    Worry: Not on file    Inability: Not on file  . Transportation needs    Medical: Not  on file    Non-medical: Not on file  Tobacco Use  . Smoking status: Never Smoker  . Smokeless tobacco: Never Used  Substance and Sexual Activity  . Alcohol use: No  . Drug use: No   Current Outpatient Medications on File Prior to Visit  Medication Sig Dispense Refill  . acetaminophen (TYLENOL) 500 MG tablet Take 2 tablets (1,000 mg total) by mouth every 8 (eight) hours as needed. 30 tablet 0  . aspirin EC 81 MG tablet Take 81 mg by mouth daily.    . Continuous Blood Gluc Receiver (FREESTYLE LIBRE 2 READER) DEVI 1 each by Does not apply route daily. 1 each 0  . Continuous Blood Gluc Sensor (FREESTYLE LIBRE 2 SENSOR) MISC 1 each by Does not apply route every 14 (fourteen) days. 6 each 3  . EASY COMFORT PEN NEEDLES 31G X 5 MM MISC USE AS DIRECTED FOUR TO SIX TIMES DAILY 400 each 3  . Glucagon 3 MG/DOSE  POWD Place 3 mg into the nose once as needed for up to 1 dose. 1 each 11  . Insulin Lispro-aabc, 1 U Dial, (LYUMJEV KWIKPEN) 100 UNIT/ML SOPN Inject under skin up to 24 units a day as advised 30 mL 3  . Insulin Pen Needle 32G X 4 MM MISC Use 4-6x a day 400 each 3  . losartan (COZAAR) 25 MG tablet TAKE ONE TABLET BY MOUTH DAILY. 90 tablet 3  . metoprolol succinate (TOPROL-XL) 50 MG 24 hr tablet Take 1 tablet (50 mg total) by mouth daily. Patient needs appointment for any future refills. Please call [phone] to schedule appointment.  1st attempt. 30 tablet 1  . Multiple Vitamins-Minerals (MULTIVITAMIN WITH MINERALS) tablet Take 1 tablet by mouth daily.    Marland Kitchen oxyCODONE (OXY IR/ROXICODONE) 5 MG immediate release tablet Take 1 tablet (5 mg total) by mouth every 4 (four) hours as needed for severe pain. 30 tablet 0  . rosuvastatin (CRESTOR) 40 MG tablet Take 1 tablet (40 mg total) by mouth at bedtime. Patient needs appointment for any future refills. Please call [phone] to schedule appointment.  1st attempt. 30 tablet 1  . TOUJEO SOLOSTAR 300 UNIT/ML Solostar Pen INJECT 18-20 UNITS INTO THE SKIN  AT BEDTIME. (BOX MUST LAST 66 DAYS PER INSURANCE) 4.5 mL 2   No current facility-administered medications on file prior to visit.   No Known Allergies Family History  Problem Relation Age of Onset  . Coronary artery disease Mother 11       Died age 56, CABG  . CAD Father 33       Died age 79, CABG  . CAD Brother 49       Stents  . Heart failure Maternal Grandmother     PE: BP 140/80 (BP Location: Left Arm, Patient Position: Sitting, Cuff Size: Normal)   Pulse 78   Ht 5\' 9"  (1.753 m)   Wt 154 lb 3.2 oz (69.9 kg)   SpO2 98%   BMI 22.77 kg/m  Wt Readings from Last 3 Encounters:  07/09/20 154 lb 3.2 oz (69.9 kg)  03/09/20 156 lb 11.2 oz (71.1 kg)  10/03/19 154 lb (69.9 kg)   Constitutional: normal weight, in NAD Eyes: PERRLA, EOMI, no exophthalmos ENT: moist mucous membranes, no thyromegaly, no cervical lymphadenopathy Cardiovascular: RRR, No MRG Respiratory: CTA B Gastrointestinal: abdomen soft, NT, ND, BS+ Musculoskeletal: no deformities, strength intact in all 4 Skin: moist, warm, no rashes Neurological: no tremor with outstretched hands, DTR normal in all 4  ASSESSMENT: 1. DM1, insulin-dependent, uncontrolled, with complications - CAD - s/p DES 2017, CABG x1 04/2017 - PN  We diagnosed type 1 diabetes based on the low C-peptide suggesting insulin deficiency: Component     Latest Ref Rng & Units 12/31/2018  ZNT8 Antibodies     U/mL <15  Glutamic Acid Decarb Ab     <5 IU/mL <5  C-Peptide     0.80 - 3.85 ng/mL <0.10 (L)  Glucose, Plasma     65 - 99 mg/dL 94  POC Glucose     70 - 99 mg/dl 132 (A)  Islet Cell Ab     Neg:<1:1 Negative   2. HL  PLAN:  1. Patient with longstanding, old, insulin-dependent diabetes, diagnosed as type I in 12/2018, due to low insulin production.  His sugars are highly fluctuating, consistent with insulin deficiency.  At last visit, HbA1c was slightly higher, at 7.7%.  At that time, per review of his freestyle libre CGM, it  appears  that she had a significant increase in blood sugars after breakfast.  He was eating a small snack after waking up and then eating the rest of the breakfast when he got to work.  He was trying to bolus for these, but he did not inject enough insulin.  We strengthened his insulin to carb ratios at that time.  I also suggested to change from Humalog to Lyumjev, to give him more flexibility in dosing and help with postprandial blood sugar control.  I recommended to bolus before every single meal, as he was not covering some of his meals.  CGM interpretation: -At today's visit, we reviewed his CGM downloads: It appears that 74% of values are in target range (goal >70%), while 23% are higher than 180 (goal <25%), and 3% are lower than 70 (goal <4%).  The calculated average blood sugar is 148.  The projected HbA1c for the next 3 months (GMI) is 6.9%. -Reviewing the CGM trends, the sugars are fluctuating in a much narrower range the majority of the time, with only slight increases after meals but more significant increase after dinner.  Upon questioning, she is having more carbs at night and he may not bolus for all of these are forgetting to the bolus at the beginning of the meal.  He did see a significant improvement in his postprandial blood sugars after switching to Lyumjev.  He is varying the ICR based on the amount of carbs in his diet but at night, we did not, he is mostly using a 1:8 ratio.  I advised him that if he has more of starches, he can use the 1:6 ICR. -Otherwise, overall, he has less lows compared to before, but still having some at different times of the day.  Therefore, I advised him to use a slightly lower dose of Toujeo if he plans to be more active in certain days, but otherwise, we can continue 18 units dose. - I suggested to:  Patient Instructions  Please use: - Toujeo 16-18 units in a.m.  Please use: - Lyumjev ICR:  - 1:8, except 1:10 for lunch - 1:6 with starches: rice, pasta,  potatoes, etc. Target: 120 Sensitivity factor: 1:40  Please do the following approximately before every meal: - count carbs (C) - check sugars (S) - inject insulin (I)  Please return in 4 months.   - we checked his HbA1c: 7.4% (lower) - advised to check sugars at different times of the day - 4x a day, rotating check times - advised for yearly eye exams >> he is not UTD-again advised to schedule - return to clinic in 4 months  2. HL -Reviewed latest lipid panel from last summer: All fractions at goal: Lab Results  Component Value Date   CHOL 114 10/03/2019   HDL 41.40 10/03/2019   LDLCALC 64 10/03/2019   TRIG 42.0 10/03/2019   CHOLHDL 3 10/03/2019  -Continues Crestor 40 mg daily without side effects  Philemon Kingdom, MD PhD [facility]

## 2020-07-09 NOTE — Addendum Note (Signed)
Addended by: Lauralyn Primes on: 07/09/2020 10:59 AM   Modules accepted: Orders

## 2020-07-09 NOTE — Patient Instructions (Signed)
Please use: - Toujeo 16-18 units in a.m.  Please use: - Lyumjev ICR:  - 1:8, except 1:10 for lunch - 1:6 with starches: rice, pasta, potatoes, etc. Target: 120 Sensitivity factor: 1:40  Please do the following approximately before every meal: - count carbs (C) - check sugars (S) - inject insulin (I)  Please return in 4 months.

## 2020-07-26 ENCOUNTER — Other Ambulatory Visit: Payer: Self-pay | Admitting: Cardiology

## 2020-09-13 ENCOUNTER — Other Ambulatory Visit: Payer: Self-pay | Admitting: Internal Medicine

## 2020-09-27 ENCOUNTER — Other Ambulatory Visit: Payer: Self-pay | Admitting: Cardiology

## 2020-10-06 NOTE — Progress Notes (Signed)
Cardiology Office Note   Date:  10/07/2020   ID:  Kelly Hardy, DOB May 11, 1960, MRN [id number]  PCP:  [name], MD  Cardiologist:   [name], MD    Chief Complaint  Patient presents with   Coronary Artery Disease      History of Present Illness: 60 y/o Caucasian male with complaints of exertional chest pain 02/01/16. OP stress Myoview done 02/09/16 showed diaphragmatic attenuation with 55mm inferior ST depression and chest pain. He was admitted the next day to [facility] for OP cath which revealed a 95% mRCA. He recieved a DES with good results.  I saw him in follow up and he had increased angina and was admitted for cath.  This demonstrated dLM-ostial LAD 90% stenosis and 45% mLAD stenosis. The RCA stent was patent. He was discharged ad re admitted Jan 2nd 2019 for elective CABG x 1 with an LIMA-LAD. He tolerated this well. EF was 55-60%.  He returns for follow up.    Since I last saw him he continues to work very physical job.  The patient denies any new symptoms such as chest discomfort, neck or arm discomfort. There has been no new shortness of breath, PND or orthopnea. There have been no reported palpitations, presyncope or syncope.  He fatigues more than he did but he does not have a profound fatigue he had before his bypass.  He is just not sleeping well at night.   Past Medical History:  Diagnosis Date   Coronary artery disease    Diabetes mellitus without complication ([place])    Type I   GERD (gastroesophageal reflux disease)    Hyperlipidemia    Hypertension    Palpitations    eval 2014    Past Surgical History:  Procedure Laterality Date   CARDIAC CATHETERIZATION N/A 02/10/2016   Procedure: Left Heart Cath and Coronary Angiography;  Surgeon: [name], MD;  Location: [place] [facility];  Service: Cardiovascular;  Laterality: N/A;   CARDIAC CATHETERIZATION N/A 02/10/2016   Procedure: Coronary Stent Intervention;  Surgeon: [name], MD;  Location: [place] [facility];  Service: Cardiovascular;  Laterality: N/A;   CORONARY ARTERY BYPASS GRAFT N/A 04/04/2017   Procedure: CORONARY ARTERY BYPASS GRAFTING (CABG), times one, using left internal mammary artery. Off pump;  Surgeon: Melrose Nakayama, MD;  Location: [place];  Service: Open Heart Surgery;  Laterality: N/A;   CORONARY STENT PLACEMENT  02/10/2016   STENT XIENCE ALPINE RX 3.25X15 drug eluting stent was successfully placed   LEFT HEART CATH AND CORONARY ANGIOGRAPHY N/A 03/23/2017   Procedure: LEFT HEART CATH AND CORONARY ANGIOGRAPHY;  Surgeon: Martinique, Peter M, MD;  Location: [place] [facility];  Service: Cardiovascular;  Laterality: N/A;   SALIVARY GLAND SURGERY Left    benign   TEE WITHOUT CARDIOVERSION N/A 04/04/2017   Procedure: TRANSESOPHAGEAL ECHOCARDIOGRAM (TEE);  Surgeon: Melrose Nakayama, MD;  Location: [place];  Service: Open Heart Surgery;  Laterality: N/A;     Current Outpatient Medications  Medication Sig Dispense Refill   acetaminophen (TYLENOL) 500 MG tablet Take 2 tablets (1,000 mg total) by mouth every 8 (eight) hours as needed. 30 tablet 0   aspirin EC 81 MG tablet Take 81 mg by mouth daily.     Continuous Blood Gluc Receiver (FREESTYLE LIBRE 2 READER) DEVI 1 each by Does not apply route daily. 1 each 0   Continuous Blood Gluc Sensor (FREESTYLE LIBRE 2 SENSOR) MISC APPLY A  NEW SENSOR EVERY 14 DAYS. 6 each 3   EASY COMFORT PEN NEEDLES 31G X 5 MM MISC USE AS DIRECTED FOUR TO SIX TIMES DAILY 400 each 3   Glucagon 3 MG/DOSE POWD Place 3 mg into the nose once as needed for up to 1 dose. 1 each 11   Insulin Lispro-aabc, 1 U Dial, (LYUMJEV KWIKPEN) 100 UNIT/ML SOPN Inject under skin up to 24 units a day as advised 30 mL 3   Insulin Pen Needle 32G X 4 MM MISC Use 4-6x a day 400 each 3   losartan (COZAAR) 25 MG tablet TAKE ONE TABLET BY MOUTH DAILY. 90 tablet 3   metoprolol succinate (TOPROL-XL) 50 MG 24 hr tablet Take 1 tablet (50 mg total) by mouth daily. Patient needs  appointment for any future refills. Please call [phone] to schedule appointment.  1st attempt. 30 tablet 1   Multiple Vitamins-Minerals (MULTIVITAMIN WITH MINERALS) tablet Take 1 tablet by mouth daily.     rosuvastatin (CRESTOR) 40 MG tablet TAKE ONE TABLET BY MOUTH EVERY DAY *PLEASE SCHEDULE APPT FOR FUTURE REFILLS* 30 tablet 0   tadalafil (CIALIS) 5 MG tablet Take 1 tablet (5 mg total) by mouth daily as needed for erectile dysfunction. 10 tablet 0   TOUJEO SOLOSTAR 300 UNIT/ML Solostar Pen INJECT 18-20 UNITS INTO THE SKIN AT BEDTIME. (BOX MUST LAST 66 DAYS PER INSURANCE) 4.5 mL 2   No current facility-administered medications for this visit.    Allergies:   Patient has no known allergies.    ROS:  Please see the history of present illness.   Otherwise, review of systems are positive for ED.   All other systems are reviewed and negative.    PHYSICAL EXAM: VS:  BP (!) 146/62   Pulse 78   Ht 5\' 9"  (1.753 m)   Wt 153 lb 12.8 oz (69.8 kg)   SpO2 99%   BMI 22.71 kg/m  , BMI Body mass index is 22.71 kg/m.  GENERAL:  Well appearing NECK:  No jugular venous distention, waveform within normal limits, carotid upstroke brisk and symmetric, no bruits, no thyromegaly LUNGS:  Clear to auscultation bilaterally CHEST:  Well healed sternotomy scar. HEART:  PMI not displaced or sustained,S1 and S2 within normal limits, no S3, no S4, no clicks, no rubs, no murmurs ABD:  Flat, positive bowel sounds normal in frequency in pitch, no bruits, no rebound, no guarding, no midline pulsatile mass, no hepatomegaly, no splenomegaly EXT:  2 plus pulses throughout, no edema, no cyanosis no clubbing  EKG:  EKG is  ordered today. Sinus rhythm, rate 78 , axis within normal limits, intervals within normal limits, no acute ST-T wave changes.  Recent Labs: No results found for requested labs within last 8760 hours.     Lab Results  Component Value Date   HGBA1C 7.4 (A) 07/09/2020    Wt Readings from Last  3 Encounters:  10/07/20 153 lb 12.8 oz (69.8 kg)  07/09/20 154 lb 3.2 oz (69.9 kg)  03/09/20 156 lb 11.2 oz (71.1 kg)     Other studies Reviewed: Additional studies/ records that were reviewed today include:  Labs  Review of the above records demonstrates:  See elsewhere   ASSESSMENT AND PLAN:  CABG:    The patient has no new sypmtoms.  No further cardiovascular testing is indicated.  We will continue with aggressive risk reduction and meds as listed.  DYSLIPIDEMIA:    LDL was 64 a year ago.  I will repeat  this today.  HTN:    The blood pressure is at target.  No change in therapy.  It is slightly elevated today but at home it is always in the 1 34-1 30 range systolic and diastolics are controlled.  DM:  A1c was 7.4 which is the lowest it had been .  He is being followed by Dr. Cruzita Lederer.  ED:  I will prescribe PRN Cialis.    Current medicines are reviewed at length with the patient today.  The patient does not have concerns regarding medicines.  The following changes have been made: As above  Labs/ tests ordered today include:     Orders Placed This Encounter  Procedures   Lipid panel   EKG 12-Lead      Disposition:   Follow up with me in 12 months  Signed, [name], MD  10/07/2020 10:07 AM    [place]

## 2020-10-07 ENCOUNTER — Ambulatory Visit (INDEPENDENT_AMBULATORY_CARE_PROVIDER_SITE_OTHER): Payer: Commercial Managed Care - PPO | Admitting: Cardiology

## 2020-10-07 ENCOUNTER — Encounter: Payer: Self-pay | Admitting: Cardiology

## 2020-10-07 ENCOUNTER — Other Ambulatory Visit: Payer: Self-pay

## 2020-10-07 VITALS — BP 146/62 | HR 78 | Ht 69.0 in | Wt 153.8 lb

## 2020-10-07 DIAGNOSIS — Z951 Presence of aortocoronary bypass graft: Secondary | ICD-10-CM | POA: Diagnosis not present

## 2020-10-07 DIAGNOSIS — E118 Type 2 diabetes mellitus with unspecified complications: Secondary | ICD-10-CM | POA: Diagnosis not present

## 2020-10-07 DIAGNOSIS — I10 Essential (primary) hypertension: Secondary | ICD-10-CM

## 2020-10-07 DIAGNOSIS — E785 Hyperlipidemia, unspecified: Secondary | ICD-10-CM

## 2020-10-07 LAB — LIPID PANEL
Chol/HDL Ratio: 2.6 ratio (ref 0.0–5.0)
Cholesterol, Total: 104 mg/dL (ref 100–199)
HDL: 40 mg/dL (ref >39)
LDL Chol Calc (NIH): 50 mg/dL (ref 0–99)
Triglycerides: 66 mg/dL (ref 0–149)
VLDL Cholesterol Cal: 14 mg/dL (ref 5–40)

## 2020-10-07 MED ORDER — TADALAFIL 5 MG PO TABS: 5.0000 mg | ORAL_TABLET | Freq: Every day | ORAL | 0 refills | Status: AC | PRN

## 2020-10-07 NOTE — Patient Instructions (Addendum)
Medication Instructions:  RX FOR CIALIS HAS BEEN [facility]  *If you need a refill on your cardiac medications before your next appointment, please call your pharmacy*  Lab Work: LIPIDS TODAY   If you have labs (blood work) drawn today and your tests are completely normal, you will receive your results only by: [place] (if you have MyChart) OR A paper copy in the mail If you have any lab test that is abnormal or we need to change your treatment, we will call you to review the results.  Testing/Procedures: NONE  Follow-Up: At [facility], you and your health needs are our priority.  As part of our continuing mission to provide you with exceptional heart care, we have created designated Provider Care Teams.  These Care Teams include your primary Cardiologist (physician) and Advanced Practice Providers (APPs -  Physician Assistants and Nurse Practitioners) who all work together to provide you with the care you need, when you need it.  We recommend signing up for the patient portal called "MyChart".  Sign up information is provided on this After Visit Summary.  MyChart is used to connect with patients for Virtual Visits (Telemedicine).  Patients are able to view lab/test results, encounter notes, upcoming appointments, etc.  Non-urgent messages can be sent to your provider as well.   To learn more about what you can do with MyChart, go to [email or web address].    Your next appointment:   12 month(s)  The format for your next appointment:   In Person  Provider:   DR [place] NP/PA

## 2020-10-16 ENCOUNTER — Other Ambulatory Visit: Payer: Self-pay | Admitting: Cardiology

## 2020-10-27 ENCOUNTER — Other Ambulatory Visit: Payer: Self-pay | Admitting: Cardiology

## 2020-11-19 ENCOUNTER — Ambulatory Visit: Payer: Commercial Managed Care - PPO | Admitting: Internal Medicine

## 2020-12-23 ENCOUNTER — Encounter: Payer: Self-pay | Admitting: Internal Medicine

## 2020-12-23 ENCOUNTER — Ambulatory Visit (INDEPENDENT_AMBULATORY_CARE_PROVIDER_SITE_OTHER): Payer: Commercial Managed Care - PPO | Admitting: Internal Medicine

## 2020-12-23 ENCOUNTER — Other Ambulatory Visit: Payer: Self-pay

## 2020-12-23 ENCOUNTER — Other Ambulatory Visit: Payer: Self-pay | Admitting: Cardiology

## 2020-12-23 VITALS — BP 124/76 | HR 65 | Ht 69.0 in | Wt 154.8 lb

## 2020-12-23 DIAGNOSIS — E7800 Pure hypercholesterolemia, unspecified: Secondary | ICD-10-CM

## 2020-12-23 DIAGNOSIS — E1059 Type 1 diabetes mellitus with other circulatory complications: Secondary | ICD-10-CM | POA: Diagnosis not present

## 2020-12-23 LAB — POCT GLYCOSYLATED HEMOGLOBIN (HGB A1C): Hemoglobin A1C: 7.9 % — AB (ref 4.0–5.6)

## 2020-12-23 NOTE — Progress Notes (Signed)
Patient ID: Kelly Hardy, male   DOB: 1961/01/20, 60 y.o.   MRN: [id number]   This visit occurred during the SARS-CoV-2 public health emergency.  Safety protocols were in place, including screening questions prior to the visit, additional usage of staff PPE, and extensive cleaning of exam room while observing appropriate contact time as indicated for disinfecting solutions.   HPI: Kelly Hardy is a 60 y.o.-year-old male, initially referred by his cardiologist, Dr. Percival Spanish, returning for follow-up for DM type I, initially dx in 1993 as DM type II, insulin-dependent since 1994, uncontrolled, with complications (CAD - s/p DES 2017, CABG x1 2019; PN). He saw Dr. Forde Dandy until few years ago.  Last visit with me 5 months ago.  Interim history: No increased urination, blurry vision (only when sugars are high), nausea, chest pain. He was very busy at work recently - no days off. He was snacking more between meals.  Also, he was not already taking insulin injections before meals, but sometimes after meals.  Sugars have been more fluctuating. Planning to retire in 02/2022.  Reviewed HbA1c levels: Lab Results  Component Value Date   HGBA1C 7.4 (A) 07/09/2020   HGBA1C 7.7 (A) 03/09/2020   HGBA1C 7.5 (A) 10/03/2019   HGBA1C 7.8 (A) 07/18/2019   HGBA1C 8.1 (A) 04/14/2019   HGBA1C 8.5 (H) 04/02/2017  10/11/2018: HbA1c 8.3% 10/19/2017: HbA1c 8.5%  Pt was on a regimen of: - Humalog 75/25 15 units 2x a day before meals - Humalog 2-8 units 3x a day, before meals - per sliding scale: ISF 50, target 120, ICR ~1:20  Currently on: - Toujeo 20 >> 18 >> 16-18 units in a.m. - Humalog >> Lyumjev: ICR:  - 1:8 - 1:6  with starches: rice, pasta, potatoes, etc. Target: 120 Sensitivity factor: 1:40  He checks his sugars more than 4 times a day with his freestyle libre CGM:  Previously:   Prev.:   Lowest sugar was 39 ...>> 57 x1, OTW 72 >> 53; he has hypoglycemia awareness in the 80s. Highest sugar  was 393 ...>> 200s >> 385.  Glucometer: One Touch Verio  Pt's meals are: - Breakfast: egg sandwich or whole wheat bread + mayo, coffee - Lunch: microwaveable lunch, chicken sandwich, PB crackers or something else light - Dinner: sandwich with mayo + lean meat: [place] or ham, no veggies usually - Snacks: crackers, almonds at night He is walking for exercise.  She tried more intense exercise but had severe muscle aches.  -No CKD, last BUN/creatinine:  Lab Results  Component Value Date   BUN 17 10/03/2019   BUN 10 04/07/2017   CREATININE 0.85 10/03/2019   CREATININE 0.80 04/07/2017  10/11/2018: 13/0.94, GFR 89, glucose 271, ACR 8 10/19/2017: 16/0.92, glucose 302, ACR <3.7 On Cozaar 25.  -+ HL; last lipid panel: Lab Results  Component Value Date   CHOL 104 10/07/2020   HDL 40 10/07/2020   LDLCALC 50 10/07/2020   TRIG 66 10/07/2020   CHOLHDL 2.6 10/07/2020  10/11/2018: 106/41/36/62 10/19/2017: 112/46/41/62 On Crestor 40.  - last eye exam was in 01/2020: No DR. Coming up 02/2021.  -He has numbness and tingling in his toes.  He also has occasional leg cramps.  He is on ASA 81.  He also has a history of HTN.  Pt has FH of DM in daughter - type 1 - dx. At 49 y/o. Patient's wife had breast cancer.  ROS: + See HPI  I reviewed pt's medications, allergies, PMH, social hx, family  hx, and changes were documented in the history of present illness. Otherwise, unchanged from my initial visit note.  Past Medical History:  Diagnosis Date   Coronary artery disease    Diabetes mellitus without complication ([place])    Type I   GERD (gastroesophageal reflux disease)    Hyperlipidemia    Hypertension    Palpitations    eval 2014   Past Surgical History:  Procedure Laterality Date   CARDIAC CATHETERIZATION N/A 02/10/2016   Procedure: Left Heart Cath and Coronary Angiography;  Surgeon: [name], MD;  Location: [facility];  Service: Cardiovascular;  Laterality: N/A;    CARDIAC CATHETERIZATION N/A 02/10/2016   Procedure: Coronary Stent Intervention;  Surgeon: [name], MD;  Location: [facility];  Service: Cardiovascular;  Laterality: N/A;   CORONARY ARTERY BYPASS GRAFT N/A 04/04/2017   Procedure: CORONARY ARTERY BYPASS GRAFTING (CABG), times one, using left internal mammary artery. Off pump;  Surgeon: Melrose Nakayama, MD;  Location: [facility];  Service: Open Heart Surgery;  Laterality: N/A;   CORONARY STENT PLACEMENT  02/10/2016   STENT XIENCE ALPINE RX 3.25X15 drug eluting stent was successfully placed   LEFT HEART CATH AND CORONARY ANGIOGRAPHY N/A 03/23/2017   Procedure: LEFT HEART CATH AND CORONARY ANGIOGRAPHY;  Surgeon: Martinique, Peter M, MD;  Location: [facility];  Service: Cardiovascular;  Laterality: N/A;   SALIVARY GLAND SURGERY Left    benign   TEE WITHOUT CARDIOVERSION N/A 04/04/2017   Procedure: TRANSESOPHAGEAL ECHOCARDIOGRAM (TEE);  Surgeon: Melrose Nakayama, MD;  Location: [facility];  Service: Open Heart Surgery;  Laterality: N/A;   Social History   Socioeconomic History   Marital status: Married    Spouse name: Not on file   Number of children: 2   Years of education: Not on file   Highest education level: Not on file  Occupational History   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 30 tablet 0   aspirin EC 81 MG tablet Take 81 mg by mouth daily.     Continuous Blood Gluc Receiver  (FREESTYLE LIBRE 2 READER) DEVI 1 each by Does not apply route daily. 1 each 0   Continuous Blood Gluc Sensor (FREESTYLE LIBRE 2 SENSOR) MISC APPLY A NEW SENSOR EVERY 14 DAYS. 6 each 3   EASY COMFORT PEN NEEDLES 31G X 5 MM MISC USE AS DIRECTED FOUR TO SIX TIMES DAILY 400 each 3   Glucagon 3 MG/DOSE POWD Place 3 mg into the nose once as needed for up to 1 dose. 1 each 11   Insulin Lispro-aabc, 1 U Dial, (LYUMJEV KWIKPEN) 100 UNIT/ML SOPN Inject under skin up to 24 units a day as advised 30 mL 3   Insulin Pen Needle 32G X 4 MM MISC Use 4-6x a day 400 each 3  losartan (COZAAR) 25 MG tablet TAKE ONE TABLET BY MOUTH DAILY. 90 tablet 3   metoprolol succinate (TOPROL-XL) 50 MG 24 hr tablet TAKE ONE TABLET BY MOUTH ONCE DAILY; PATIENT NEEDS APPOINTMENT FOR ANY FUTURE REFILLS. 30 tablet 1   Multiple Vitamins-Minerals (MULTIVITAMIN WITH MINERALS) tablet Take 1 tablet by mouth daily.     rosuvastatin (CRESTOR) 40 MG tablet TAKE ONE TABLET BY MOUTH EVERY DAY 90 tablet 3   tadalafil (CIALIS) 5 MG tablet Take 1 tablet (5 mg total) by mouth daily as needed for erectile dysfunction. 10 tablet 0   TOUJEO SOLOSTAR 300 UNIT/ML Solostar Pen INJECT 18-20 UNITS INTO THE SKIN AT BEDTIME. (BOX MUST LAST 66 DAYS PER INSURANCE) 4.5 mL 2   No current facility-administered medications on file prior to visit.   No Known Allergies Family History  Problem Relation Age of Onset   Coronary artery disease Mother 75       Died age 81, CABG   CAD Father 42       Died age 70, CABG   CAD Brother 43       Stents   Heart failure Maternal Grandmother     PE: There were no vitals taken for this visit. Wt Readings from Last 3 Encounters:  10/07/20 153 lb 12.8 oz (69.8 kg)  07/09/20 154 lb 3.2 oz (69.9 kg)  03/09/20 156 lb 11.2 oz (71.1 kg)   Constitutional: normal weight, in NAD Eyes: PERRLA, EOMI, no exophthalmos ENT: moist mucous membranes, no thyromegaly, no cervical lymphadenopathy Cardiovascular: RRR, No  MRG Respiratory: CTA B Gastrointestinal: abdomen soft, NT, ND, BS+ Musculoskeletal: no deformities, strength intact in all 4 Skin: moist, warm, no rashes Neurological: no tremor with outstretched hands, DTR normal in all 4  ASSESSMENT: 1. DM1, insulin-dependent, uncontrolled, with complications - CAD - s/p DES 2017, CABG x1 04/2017 - PN  We diagnosed type 1 diabetes based on the low C-peptide suggesting insulin deficiency: Component     Latest Ref Rng & Units 12/31/2018  ZNT8 Antibodies     U/mL <15  Glutamic Acid Decarb Ab     <5 IU/mL <5  C-Peptide     0.80 - 3.85 ng/mL <0.10 (L)  Glucose, Plasma     65 - 99 mg/dL 94  POC Glucose     70 - 99 mg/dl 132 (A)  Islet Cell Ab     Neg:<1:1 Negative   2. HL  PLAN:  1. Patient with longstanding, insulin-dependent type 1 diabetes, diagnosed as such in 12/2018 where he was found to have low insulin production.  His sugars are highly fluctuating, consistent with insulin deficiency, but he had an improvement in blood sugars after switching from Humalog to Lyumjev and also after adjusting his insulin to carb ratios.  At last visit, sugars are fluctuating in a much narrower range the majority of the time, with only slight increases after meals but more significant increase after dinner.  Upon questioning, he was having more carbs at night and he was not necessarily bolusing for all of these carbs or forgetting to take the bolus at the beginning of the meal.  He was varying the ICR based on the amount of carbs in his diet but we discussed about using a stricter insulin to carb ratio for more carb rich meal: 1: 6.  We also discussed about possibly using a lower dose of Toujeo if he plans to be more active in certain days to avoid hypoglycemia. CGM interpretation: -At today's visit, we  reviewed his CGM downloads: It appears that 48% of values are in target range (goal >70%), while 50% are higher than 180 (goal <25%), and 2% are lower than 70 (goal  <4%).  The calculated average blood sugar is 186.  The projected HbA1c for the next 3 months (GMI) is 7.8%. -Reviewing the CGM trends, sugars are definitely more variable compared to before at all times of the day and also during the night. No clear patterns other than postprandial hyperglycemia usually followed by lower blood sugars in the late postprandial periods.  I explained that this pattern is usually caused by taking insulin too late and he admits that this is usually the case.  He is also snacking between meals and overcorrecting low.  We discussed about trying to avoid snacking between meals and caring glucose tablets or glucose gel with him to prevent hypoglycemia if needed.  I advised him that if he persisted seeing hypoglycemic values at a particular time of the day, will need to change either Toujeo dose or the insulin to carb ratio before that meal.  -His work schedule will start clearing up soon and he even plans to retire towards the end of next year.  I think this would beneficial for him from the diabetes point of view. -Therefore, for now, we will continue the same insulin regimen but work on compliance with diet and insulin injections - I suggested to:  Patient Instructions  Please use: - Toujeo 16-18 units in a.m. - Lyumjev ICR:  - 1:8, except 1:10 for lunch - 1:6 with starches: rice, pasta, potatoes, etc. Target: 120 Sensitivity factor: 1:40  Please do the following approximately before every meal: - count carbs (C) - check sugars (S) - inject insulin (I)  Please return in 4 months.   - we checked his HbA1c: 7.7% (higher) - advised to check sugars at different times of the day - 4x a day, rotating check times - advised for yearly eye exams >> he is UTD - return to clinic in 4 months  2. HL -Reviewed latest lipid panel from 10/2020: All fractions excellent: Lab Results  Component Value Date   CHOL 104 10/07/2020   HDL 40 10/07/2020   LDLCALC 50 10/07/2020    TRIG 66 10/07/2020   CHOLHDL 2.6 10/07/2020  -Continues Crestor 40 mg daily without side effects  Philemon Kingdom, MD PhD [facility] Endocrinology

## 2020-12-23 NOTE — Patient Instructions (Addendum)
Please use: - Toujeo 16-18 units in a.m. - Lyumjev ICR:  - 1:8, except 1:10 for lunch - 1:6 with starches: rice, pasta, potatoes, etc. Target: 120 Sensitivity factor: 1:40  Please do the following approximately before every meal: - count carbs (C) - check sugars (S) - inject insulin (I)  Please return in 4 months.

## 2021-01-27 ENCOUNTER — Telehealth: Payer: Self-pay | Admitting: Internal Medicine

## 2021-01-27 ENCOUNTER — Telehealth: Payer: Self-pay | Admitting: *Deleted

## 2021-01-27 DIAGNOSIS — Z006 Encounter for examination for normal comparison and control in clinical research program: Secondary | ICD-10-CM

## 2021-01-27 NOTE — Telephone Encounter (Signed)
Pt calling in requesting refill of Insulin Lispro-aabc, 1 U Dial, (LYUMJEV KWIKPEN) 100 UNIT/ML SOPN  to  [facility], [address], [place] [address]   Pt also feels like he's taking too much because he seems to be running out faster.   Pt contact [phone]

## 2021-01-27 NOTE — Telephone Encounter (Signed)
I called patient to clarify question not answered on Coordinate-Diabetes Study consent. I need to ask if patient is willing to let [facility] follow-up with him for up to 5 years. I left message for patient to call me.

## 2021-01-28 NOTE — Telephone Encounter (Signed)
Called and left a message for pt to call back to confirm max number of units used daily via Pump. When we confirm we can ensure rx being sent to pharmacy is enough for pt to not run out early.

## 2021-01-31 NOTE — Telephone Encounter (Signed)
Pt called and says he uses 24units/ daily of Lyumjev. Pt not on a pump. Pt borrowing Novolog from Daughter, has no more Lyumjev left.  [facility] - [place], [address]  [place], [place] [address]   Pt contact [phone]

## 2021-02-01 NOTE — Telephone Encounter (Signed)
Called and spoke with the pharmacy and they confirmed they were unable to give the full rx (2 boxes of insulin) due pt's insurance. Pt should be able to pick up next rx after Wednesday of next week.

## 2021-02-01 NOTE — Telephone Encounter (Signed)
Called and left a detailed message for pt advising the correct prescription was sent but due to pt's insurance they will only provide 1 box which covers 60 days. He can contact his pharmacy for more information. He can get a refill next week.

## 2021-02-04 ENCOUNTER — Encounter: Payer: Self-pay | Admitting: Internal Medicine

## 2021-02-04 LAB — HM DIABETES EYE EXAM

## 2021-03-30 ENCOUNTER — Other Ambulatory Visit: Payer: Self-pay | Admitting: Cardiology

## 2021-03-30 ENCOUNTER — Other Ambulatory Visit: Payer: Self-pay | Admitting: Internal Medicine

## 2021-04-15 ENCOUNTER — Encounter: Payer: Self-pay | Admitting: Cardiology

## 2021-04-25 ENCOUNTER — Ambulatory Visit: Payer: Commercial Managed Care - PPO | Admitting: Internal Medicine

## 2021-04-28 ENCOUNTER — Encounter: Payer: Self-pay | Admitting: Internal Medicine

## 2021-04-28 ENCOUNTER — Other Ambulatory Visit: Payer: Self-pay

## 2021-04-28 ENCOUNTER — Ambulatory Visit (INDEPENDENT_AMBULATORY_CARE_PROVIDER_SITE_OTHER): Payer: Commercial Managed Care - PPO | Admitting: Internal Medicine

## 2021-04-28 VITALS — BP 140/79 | HR 84 | Ht 69.0 in | Wt 157.6 lb

## 2021-04-28 DIAGNOSIS — Z23 Encounter for immunization: Secondary | ICD-10-CM | POA: Diagnosis not present

## 2021-04-28 DIAGNOSIS — E7800 Pure hypercholesterolemia, unspecified: Secondary | ICD-10-CM | POA: Diagnosis not present

## 2021-04-28 DIAGNOSIS — E1059 Type 1 diabetes mellitus with other circulatory complications: Secondary | ICD-10-CM

## 2021-04-28 LAB — POCT GLYCOSYLATED HEMOGLOBIN (HGB A1C): Hemoglobin A1C: 7.4 % — AB (ref 4.0–5.6)

## 2021-04-28 MED ORDER — LYUMJEV KWIKPEN 100 UNIT/ML ~~LOC~~ SOPN: PEN_INJECTOR | SUBCUTANEOUS | 3 refills | Status: DC

## 2021-04-28 NOTE — Patient Instructions (Signed)
Please use: - Toujeo 16-18 units in a.m. - Lyumjev ICR:  - 1:8, except 1:10 for lunch - 1:6 with starches: rice, pasta, potatoes, etc. Target: 120 Sensitivity factor: 1:40  Count 50% of the proteins in a meal as carbs.  Please do the following approximately before every meal: - count carbs (C) - check sugars (S) - inject insulin (I)  Please return in 4 months.

## 2021-04-28 NOTE — Progress Notes (Addendum)
Patient ID: Kelly Hardy, male   DOB: 06-Sep-1960, 61 y.o.   MRN: [id number]   This visit occurred during the SARS-CoV-2 public health emergency.  Safety protocols were in place, including screening questions prior to the visit, additional usage of staff PPE, and extensive cleaning of exam room while observing appropriate contact time as indicated for disinfecting solutions.   HPI: Kelly Hardy is a 61 y.o.-year-old male, initially referred by his cardiologist, Dr. Percival Spanish, returning for follow-up for DM type I, initially dx in 1993 as DM type II, insulin-dependent since 1994, uncontrolled, with complications (CAD - s/p DES 2017, CABG x1 2019; PN). He saw Dr. Forde Dandy until few years ago.  Last visit with me 5 months ago.  Interim history: No increased urination, blurry vision, nausea, chest pain.  He does have leg swelling. He was very busy at work before last visit, without days off and his diabetes control worsened because of this. He is planning to retire in 02/2022.  Reviewed HbA1c levels: Lab Results  Component Value Date   HGBA1C 7.9 (A) 12/23/2020   HGBA1C 7.4 (A) 07/09/2020   HGBA1C 7.7 (A) 03/09/2020   HGBA1C 7.5 (A) 10/03/2019   HGBA1C 7.8 (A) 07/18/2019   HGBA1C 8.1 (A) 04/14/2019   HGBA1C 8.5 (H) 04/02/2017  10/11/2018: HbA1c 8.3% 10/19/2017: HbA1c 8.5%  Pt was on a regimen of: - Humalog 75/25 15 units 2x a day before meals - Humalog 2-8 units 3x a day, before meals - per sliding scale: ISF 50, target 120, ICR ~1:20  Currently on: - Toujeo 20 >> 18 >> 16-18 >> 18 units in a.m. - Humalog >> Lyumjev: ICR:  - 1:8 - 1:6  with starches: rice, pasta, potatoes, etc. Target: 120 Sensitivity factor: 1:40  He checks his sugars more than 4 times a day with his freestyle libre CGM:   Previously:   Prev.:   Lowest sugar was 39 ...>> 57 x1, OTW 72 >> 53 >> 50s; he has hypoglycemia awareness in the 80s. Highest sugar was 393 ...>> 200s >> 385 >> 200s.  Glucometer: One  Touch Verio  Pt's meals are: - Breakfast: egg sandwich or whole wheat bread + mayo, coffee - Lunch: microwaveable lunch, chicken sandwich, PB crackers or something else light - Dinner: sandwich with mayo + lean meat: [place] or ham, no veggies usually - Snacks: crackers, almonds at night He is walking for exercise.  She tried more intense exercise but had severe muscle aches.  -No CKD, last BUN/creatinine:  Lab Results  Component Value Date   BUN 17 10/03/2019   BUN 10 04/07/2017   CREATININE 0.85 10/03/2019   CREATININE 0.80 04/07/2017  10/11/2018: 13/0.94, GFR 89, glucose 271, ACR 8 10/19/2017: 16/0.92, glucose 302, ACR <3.7 On Cozaar 25.  -+ HL; last lipid panel: Lab Results  Component Value Date   CHOL 104 10/07/2020   HDL 40 10/07/2020   LDLCALC 50 10/07/2020   TRIG 66 10/07/2020   CHOLHDL 2.6 10/07/2020  10/11/2018: 106/41/36/62 10/19/2017: 112/46/41/62 On Crestor 40.  - last eye exam was 02/04/2021: No DR.  -He has numbness and tingling in his toes.  He also has occasional leg cramps.  He is on ASA 81.  He also has a history of HTN.  Pt has FH of DM in daughter - type 1 - dx. At 61 y/o. Patient's wife had breast cancer.  ROS: + See HPI  I reviewed pt's medications, allergies, PMH, social hx, family hx, and changes were documented in  the history of present illness. Otherwise, unchanged from my initial visit note.  Past Medical History:  Diagnosis Date   Coronary artery disease    Diabetes mellitus without complication ([place])    Type I   GERD (gastroesophageal reflux disease)    Hyperlipidemia    Hypertension    Palpitations    eval 2014   Past Surgical History:  Procedure Laterality Date   CARDIAC CATHETERIZATION N/A 02/10/2016   Procedure: Left Heart Cath and Coronary Angiography;  Surgeon: [name], MD;  Location: [facility];  Service: Cardiovascular;  Laterality: N/A;   CARDIAC CATHETERIZATION N/A 02/10/2016   Procedure: Coronary Stent  Intervention;  Surgeon: [name], MD;  Location: [facility];  Service: Cardiovascular;  Laterality: N/A;   CORONARY ARTERY BYPASS GRAFT N/A 04/04/2017   Procedure: CORONARY ARTERY BYPASS GRAFTING (CABG), times one, using left internal mammary artery. Off pump;  Surgeon: Melrose Nakayama, MD;  Location: [facility];  Service: Open Heart Surgery;  Laterality: N/A;   CORONARY STENT PLACEMENT  02/10/2016   STENT XIENCE ALPINE RX 3.25X15 drug eluting stent was successfully placed   LEFT HEART CATH AND CORONARY ANGIOGRAPHY N/A 03/23/2017   Procedure: LEFT HEART CATH AND CORONARY ANGIOGRAPHY;  Surgeon: Martinique, Peter M, MD;  Location: [facility];  Service: Cardiovascular;  Laterality: N/A;   SALIVARY GLAND SURGERY Left    benign   TEE WITHOUT CARDIOVERSION N/A 04/04/2017   Procedure: TRANSESOPHAGEAL ECHOCARDIOGRAM (TEE);  Surgeon: Melrose Nakayama, MD;  Location: [facility];  Service: Open Heart Surgery;  Laterality: N/A;   Social History   Socioeconomic History   Marital status: Married    Spouse name: Not on file   Number of children: 2   Years of education: Not on file   Highest education level: Not on file  Occupational History   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 30 tablet 0   aspirin EC 81 MG tablet Take 81 mg by mouth daily.     Continuous Blood Gluc Receiver (FREESTYLE LIBRE 2 READER) DEVI 1 each by Does not apply route daily.  1 each 0   Continuous Blood Gluc Sensor (FREESTYLE LIBRE 2 SENSOR) MISC APPLY A NEW SENSOR EVERY 14 DAYS. 6 each 3   EASY COMFORT PEN NEEDLES 31G X 5 MM MISC USE AS DIRECTED FOUR TO SIX TIMES DAILY 400 each 3   Glucagon 3 MG/DOSE POWD Place 3 mg into the nose once as needed for up to 1 dose. 1 each 11   Insulin Lispro-aabc (LYUMJEV KWIKPEN) 100 UNIT/ML KwikPen INJECT UP TO 24 UNITS UNDER THE SKIN DAILY AS DIRECTED 30 mL 3   Insulin Pen Needle 32G X 4 MM MISC Use 4-6x a day 400 each 3   losartan (COZAAR) 25 MG tablet TAKE ONE TABLET BY  MOUTH DAILY. 90 tablet 3   metoprolol succinate (TOPROL-XL) 50 MG 24 hr tablet Take 1 tablet (50 mg total) by mouth daily. 90 tablet 2   Multiple Vitamins-Minerals (MULTIVITAMIN WITH MINERALS) tablet Take 1 tablet by mouth daily.     rosuvastatin (CRESTOR) 40 MG tablet TAKE ONE TABLET BY MOUTH EVERY DAY 90 tablet 3   tadalafil (CIALIS) 5 MG tablet Take 1 tablet (5 mg total) by mouth daily as needed for erectile dysfunction. 10 tablet 0   TOUJEO SOLOSTAR 300 UNIT/ML Solostar Pen INJECT 18-20 UNITS INTO THE SKIN AT BEDTIME. (BOX MUST LAST 66 DAYS PER INSURANCE) 4.5 mL 2   No current facility-administered medications on file prior to visit.   No Known Allergies Family History  Problem Relation Age of Onset   Coronary artery disease Mother 32       Died age 53, CABG   CAD Father 23       Died age 29, CABG   CAD Brother 83       Stents   Heart failure Maternal Grandmother     PE: BP 140/79 (BP Location: Left Arm, Patient Position: Sitting, Cuff Size: Normal)    Pulse 84    Ht 5\' 9"  (1.753 m)    Wt 157 lb 9.6 oz (71.5 kg)    SpO2 97%    BMI 23.27 kg/m  Wt Readings from Last 3 Encounters:  04/28/21 157 lb 9.6 oz (71.5 kg)  12/23/20 154 lb 12.8 oz (70.2 kg)  10/07/20 153 lb 12.8 oz (69.8 kg)   Constitutional: normal weight, in NAD Eyes: PERRLA, EOMI, no exophthalmos ENT: moist mucous membranes, no thyromegaly, no cervical lymphadenopathy Cardiovascular:  RRR, No MRG, + wears compression hoses for leg swelling  Respiratory: CTA B Musculoskeletal: no deformities, strength intact in all 4 Skin: moist, warm, no rashes Neurological: no tremor with outstretched hands, DTR normal in all 4  ASSESSMENT: 1. DM1, insulin-dependent, uncontrolled, with complications - CAD - s/p DES 2017, CABG x1 04/2017 - PN  We diagnosed type 1 diabetes based on the low C-peptide suggesting insulin deficiency: Component     Latest Ref Rng & Units 12/31/2018  ZNT8 Antibodies     U/mL <15  Glutamic Acid Decarb Ab     <5 IU/mL <5  C-Peptide     0.80 - 3.85 ng/mL <0.10 (L)  Glucose, Plasma     65 - 99 mg/dL 94  POC Glucose     70 - 99 mg/dl 132 (A)  Islet Cell Ab     Neg:<1:1 Negative   2. HL  PLAN:  1. Patient with longstanding, insulin-dependent type 1 diabetes, diagnosed as such in 12/2018 when he was found to have low insulin production.  His sugars are usually quite fluctuating, consistent with insulin deficiency, but he had an improvement in blood sugars after switching from Humalog to Lyumjev and after adjusting his insulin to carb ratios.  At last visit, HbA1c was higher, at 7.9% and sugars were more variable at all times of the day and also during the night.  There were no clear patterns other than possibly higher postprandial hyperglycemia followed by lower blood sugars in the late postprandial periods.  We discussed that this pattern is usually caused by taking insulin too late and he admitted that this was usually the case.  He was also snacking between meals and over correcting lows.  We discussed about trying to avoid snacking between meals and carrying glucose tablets/gel with him to prevent  hypoglycemia if needed.  I advised him that if he persistently saw hypoglycemic values at particular times of the day, to let me know, to change either Toujeo dose or insulin to carb ratios.  At last visit he was also telling me that his schedule would clear up at  work and he was also planning to retire at the end of this year. CGM interpretation: -At today's visit, we reviewed his CGM downloads: It appears that 59% of values are in target range (goal >70%), while 38% are higher than 180 (goal <25%), and 3% are lower than 70 (goal <4%).  The calculated average blood sugar is 174.  The projected HbA1c for the next 3 months (GMI) is 7.5%. -Reviewing the CGM trends, the sugars appear to be much worse than at last visit.  We they are widely fluctuating now, with a significant hyperglycemic peak after breakfast that covers his entire midday.  Sugars only start improving in the afternoon with a nadir around 7 PM. After dinner, sugars are variable, sometimes very well controlled, but sometimes increasing into the mid 200s.  She did have sugars in the 300s after breakfast and dinner, also.  The control deterioration is surprising, since this regimen was working well for him at last visit.  Upon questioning, he admits of bolusing insulin too late after a meal.  He waits after sugars are high on the CGM before bolusing.  I explained that there is a delay of  up to 15 minutes between the blood sugars and blood and the ones in the interstitial fluid so by the time he boluses based on CGM data, his sugars and blood are running too high.  I advised him to always based on Lyumjev injection time on starting the meal, rather than the CGM increasing values.  He also has not missed Lyumjev doses.  For now, I do not feel that we need to change his regimen.  We did discuss that if he has a low carb meal, to take into account 50% of the protein weight as carbs when calculating the Lyumjev bolus. - I suggested to:  Patient Instructions  Please use: - Toujeo 16-18 units in a.m. - Lyumjev ICR:  - 1:8, except 1:10 for lunch - 1:6 with starches: rice, pasta, potatoes, etc. Target: 120 Sensitivity factor: 1:40  Count 50% of the proteins in a meal as carbs.  Please do the following  approximately before every meal: - count carbs (C) - check sugars (S) - inject insulin (I)  Please return in 4 months.   - we checked his HbA1c: 7.4% (lower) - advised to check sugars at different times of the day - 4x a day, rotating check times - advised for yearly eye exams >> he is UTD - return to clinic in 4 months  2. HL -Reviewed latest lipid panel from 10/2020: All fractions excellent: Lab Results  Component Value Date   CHOL 104 10/07/2020   HDL 40 10/07/2020   LDLCALC 50 10/07/2020   TRIG 66 10/07/2020   CHOLHDL 2.6 10/07/2020  -Continues Crestor 40 mg daily without side effects  Philemon Kingdom, MD PhD [facility]

## 2021-05-04 ENCOUNTER — Other Ambulatory Visit: Payer: Self-pay | Admitting: Internal Medicine

## 2021-06-22 ENCOUNTER — Telehealth: Payer: Self-pay

## 2021-06-22 NOTE — Telephone Encounter (Signed)
Pt contacted office to advise previously prescribed Toujeo is no longer covered under his insurance.  ?Contacted pharmacy and they advised Semglee is the covered alternative. Pharmacy unsure if PA would be required. Will contact office if there is anything further to be done. ?

## 2021-08-02 ENCOUNTER — Ambulatory Visit (INDEPENDENT_AMBULATORY_CARE_PROVIDER_SITE_OTHER): Payer: Commercial Managed Care - PPO | Admitting: Dermatology

## 2021-08-02 ENCOUNTER — Encounter: Payer: Self-pay | Admitting: Dermatology

## 2021-08-02 DIAGNOSIS — D2239 Melanocytic nevi of other parts of face: Secondary | ICD-10-CM

## 2021-08-02 DIAGNOSIS — D229 Melanocytic nevi, unspecified: Secondary | ICD-10-CM

## 2021-08-02 DIAGNOSIS — Z85828 Personal history of other malignant neoplasm of skin: Secondary | ICD-10-CM

## 2021-08-02 DIAGNOSIS — Z1283 Encounter for screening for malignant neoplasm of skin: Secondary | ICD-10-CM

## 2021-08-02 DIAGNOSIS — Z8589 Personal history of malignant neoplasm of other organs and systems: Secondary | ICD-10-CM

## 2021-08-02 MED ORDER — IMIQUIMOD 5 % EX CREA: TOPICAL_CREAM | CUTANEOUS | 1 refills | Status: DC

## 2021-08-02 NOTE — Patient Instructions (Addendum)
Apply Aldara topical to left sideburn and left arm 3 nights a week 6-8 weeks. Call office with any questions with the positive reaction red tender itchy peeling. Follow up 8-10 weeks  ?

## 2021-08-10 ENCOUNTER — Other Ambulatory Visit: Payer: Self-pay | Admitting: Internal Medicine

## 2021-08-20 ENCOUNTER — Encounter: Payer: Self-pay | Admitting: Dermatology

## 2021-08-20 NOTE — Progress Notes (Signed)
   Follow-Up Visit   Subjective  Kelly Hardy is a 61 y.o. 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  Skin examination, recheck left sideburn and several other spots Location:  Duration:  Quality:  Associated Signs/Symptoms: Modifying Factors:  Severity:  Timing: Context:   Objective  Well appearing patient in no apparent distress; mood and affect are within normal limits. General skin examination: No atypical pigmented lesions.  Possible residual superficial carcinoma left sideburn.  Keratoses neck line and right side of the face.  left side burn Subtle 2 mm pink scale clinically better fits actinic keratosis.  Agree with the patient that it is located where he had a carcinoma in situ.  Left Melolabial Fold 2 mm smooth domed papule, historically stable.  No dermoscopic atypia.    A full examination was performed including scalp, head, eyes, ears, nose, lips, neck, chest, axillae, abdomen, back, buttocks, bilateral upper extremities, bilateral lower extremities, hands, feet, fingers, toes, fingernails, and toenails. All findings within normal limits unless otherwise noted below.   Assessment & Plan    Screening exam for skin cancer  Annual skin examination.  History of squamous cell carcinoma left side burn  Multiple options discussed including repeat curettage, Mohs surgery, repeat biopsy.  Initially patient is comfortable trying topical imiquimod Monday Wednesday Friday for 6 to 8 weeks or until there is brisk local inflammation.  Recheck 1 month after the completion of this.  Fibrous papule of skin Left Melolabial Fold  Discussed confirmatory biopsy and this may be done in the future.      I, Lavonna Monarch, MD, have reviewed all documentation for this visit.  The documentation on 08/20/21 for the exam, diagnosis, procedures, and orders are all accurate and complete.

## 2021-08-26 ENCOUNTER — Encounter: Payer: Self-pay | Admitting: Internal Medicine

## 2021-08-26 ENCOUNTER — Ambulatory Visit (INDEPENDENT_AMBULATORY_CARE_PROVIDER_SITE_OTHER): Payer: Commercial Managed Care - PPO | Admitting: Internal Medicine

## 2021-08-26 VITALS — BP 130/84 | HR 72 | Ht 69.0 in | Wt 162.0 lb

## 2021-08-26 DIAGNOSIS — E1059 Type 1 diabetes mellitus with other circulatory complications: Secondary | ICD-10-CM | POA: Diagnosis not present

## 2021-08-26 DIAGNOSIS — E7800 Pure hypercholesterolemia, unspecified: Secondary | ICD-10-CM | POA: Diagnosis not present

## 2021-08-26 LAB — POCT GLYCOSYLATED HEMOGLOBIN (HGB A1C): Hemoglobin A1C: 6.7 % — AB (ref 4.0–5.6)

## 2021-08-26 NOTE — Patient Instructions (Addendum)
Please use: - Semglee 16 units in a.m. - Lyumjev ICR:  - 1:8, except 1:10 for lunch - 1:6 with starches: rice, pasta, potatoes, etc. Target: 120 Sensitivity factor: 1:40  Count 50% of the proteins in a meal as carbs for low-carb meal.  Please do the following approximately before every meal: - count carbs (C) - check sugars (S) - inject insulin (I)  Please return in 4 months.

## 2021-08-26 NOTE — Progress Notes (Signed)
Patient ID: Kelly Hardy, male   DOB: [birth year], 60 y.o.   MRN: [id number]   This visit occurred during the SARS-CoV-2 public health emergency.  Safety protocols were in place, including screening questions prior to the visit, additional usage of staff PPE, and extensive cleaning of exam room while observing appropriate contact time as indicated for disinfecting solutions.   HPI: Kelly Hardy is a 61 y.o.-year-old male, initially referred by his cardiologist, Dr. Percival Spanish, returning for follow-up for DM type I, initially dx in 1993 as DM type II, insulin-dependent since 1994, uncontrolled, with complications (CAD - s/p DES 2017, CABG x1 2019; PN). He saw Dr. Forde Dandy until few years ago.  Last visit with me 4 months ago.  Interim history: No increased urination, blurry vision, nausea, chest pain.  He continues to have leg swelling. He is planning to retire in 01/2022.  Reviewed HbA1c levels: Lab Results  Component Value Date   HGBA1C 7.4 (A) 04/28/2021   HGBA1C 7.9 (A) 12/23/2020   HGBA1C 7.4 (A) 07/09/2020   HGBA1C 7.7 (A) 03/09/2020   HGBA1C 7.5 (A) 10/03/2019   HGBA1C 7.8 (A) 07/18/2019   HGBA1C 8.1 (A) 04/14/2019   HGBA1C 8.5 (H) 04/02/2017  10/11/2018: HbA1c 8.3% 10/19/2017: HbA1c 8.5%  Pt was on a regimen of: - Humalog 75/25 15 units 2x a day before meals - Humalog 2-8 units 3x a day, before meals - per sliding scale: ISF 50, target 120, ICR ~1:20  Currently on: - Toujeo 20 >> 18 >> 16-18 >> Semglee 18 units in a.m. - Humalog >> Lyumjev: ICR:  - 1:8 - 1:6  with starches: rice, pasta, potatoes, etc. Target: 120 Sensitivity factor: 1:40  He checks his sugars more than 4 times a day with his freestyle libre CGM:   Previously:   Previously:   Lowest sugar was 39 ... >> 53 >> 50s >> 50s; he has hypoglycemia awareness in the 80s. Highest sugar was  385 >> 200s >> 200s.  Glucometer: One Touch Verio  Pt's meals are: - Breakfast: egg sandwich or whole wheat bread  + mayo, coffee - Lunch: microwaveable lunch, chicken sandwich, PB crackers or something else light - Dinner: sandwich with mayo + lean meat: [place] or ham, no veggies usually - Snacks: crackers, almonds at night He is walking for exercise.  She tried more intense exercise but had severe muscle aches.  -No CKD, last BUN/creatinine:  Lab Results  Component Value Date   BUN 17 10/03/2019   BUN 10 04/07/2017   CREATININE 0.85 10/03/2019   CREATININE 0.80 04/07/2017  10/11/2018: 13/0.94, GFR 89, glucose 271, ACR 8 10/19/2017: 16/0.92, glucose 302, ACR <3.7 On Cozaar 25.  -+ HL; last lipid panel: Lab Results  Component Value Date   CHOL 104 10/07/2020   HDL 40 10/07/2020   LDLCALC 50 10/07/2020   TRIG 66 10/07/2020   CHOLHDL 2.6 10/07/2020  10/11/2018: 106/41/36/62 10/19/2017: 112/46/41/62 On Crestor 40.  - last eye exam was 02/04/2021: No DR.   -He has numbness and tingling in his toes.  He also has occasional leg cramps, plantar fasciitis.  He is on ASA 81.  He also has a history of HTN.  Pt has FH of DM in daughter - type 1 - dx. At 39 y/o. Patient's wife had breast cancer.  ROS: + See HPI  I reviewed pt's medications, allergies, PMH, social hx, family hx, and changes were documented in the history of present illness. Otherwise, unchanged from my initial visit note.  Past Medical History:  Diagnosis Date   Coronary artery disease    Diabetes mellitus without complication ([place])    Type I   GERD (gastroesophageal reflux disease)    Hyperlipidemia    Hypertension    Palpitations    eval 2014   Squamous cell carcinoma of skin 05/26/1999   left sideburn tx with bx   Past Surgical History:  Procedure Laterality Date   CARDIAC CATHETERIZATION N/A 02/10/2016   Procedure: Left Heart Cath and Coronary Angiography;  Surgeon: [name], MD;  Location: [facility];  Service: Cardiovascular;  Laterality: N/A;   CARDIAC CATHETERIZATION N/A 02/10/2016   Procedure:  Coronary Stent Intervention;  Surgeon: [name], MD;  Location: [facility];  Service: Cardiovascular;  Laterality: N/A;   CORONARY ARTERY BYPASS GRAFT N/A 04/04/2017   Procedure: CORONARY ARTERY BYPASS GRAFTING (CABG), times one, using left internal mammary artery. Off pump;  Surgeon: Melrose Nakayama, MD;  Location: [facility];  Service: Open Heart Surgery;  Laterality: N/A;   CORONARY STENT PLACEMENT  02/10/2016   STENT XIENCE ALPINE RX [id number] drug eluting stent was successfully placed   LEFT HEART CATH AND CORONARY ANGIOGRAPHY N/A 03/23/2017   Procedure: LEFT HEART CATH AND CORONARY ANGIOGRAPHY;  Surgeon: Martinique, Peter M, MD;  Location: [facility];  Service: Cardiovascular;  Laterality: N/A;   SALIVARY GLAND SURGERY Left    benign   TEE WITHOUT CARDIOVERSION N/A 04/04/2017   Procedure: TRANSESOPHAGEAL ECHOCARDIOGRAM (TEE);  Surgeon: Melrose Nakayama, MD;  Location: [facility];  Service: Open Heart Surgery;  Laterality: N/A;   Social History   Socioeconomic History   Marital status: Married    Spouse name: Not on file   Number of children: 2   Years of education: Not on file   Highest education level: Not on file  Occupational History   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 30 tablet 0   aspirin EC 81 MG tablet Take 81 mg by mouth daily.     Continuous Blood Gluc Receiver (FREESTYLE LIBRE 2 READER) DEVI 1 each by Does not  apply route daily. 1 each 0   Continuous Blood Gluc Sensor (FREESTYLE LIBRE 2 SENSOR) MISC APPLY A NEW SENSOR EVERY 14 DAYS. 6 each 3   EASY COMFORT PEN NEEDLES 31G X 5 MM MISC USE AS DIRECTED FOUR TO SIX TIMES DAILY 400 each 3   Glucagon 3 MG/DOSE POWD Place 3 mg into the nose once as needed for up to 1 dose. 1 each 11   imiquimod (ALDARA) 5 % cream AAA Monday Wednesday Friday AT Night 6-8 weeks 12 each 1   Insulin Lispro-aabc (LYUMJEV KWIKPEN) 100 UNIT/ML KwikPen INJECT UP TO 30 UNITS UNDER THE SKIN DAILY AS DIRECTED 30 mL 3   Insulin Pen Needle 32G X 4  MM MISC Use 4-6x a day 400 each 3   losartan (COZAAR) 25 MG tablet TAKE ONE TABLET BY MOUTH DAILY. 90 tablet 3   metoprolol succinate (TOPROL-XL) 50 MG 24 hr tablet Take 1 tablet (50 mg total) by mouth daily. 90 tablet 2   Multiple Vitamins-Minerals (MULTIVITAMIN WITH MINERALS) tablet Take 1 tablet by mouth daily.     rosuvastatin (CRESTOR) 40 MG tablet TAKE ONE TABLET BY MOUTH EVERY DAY 90 tablet 3   tadalafil (CIALIS) 5 MG tablet Take 1 tablet (5 mg total) by mouth daily as needed for erectile dysfunction. 10 tablet 0   TOUJEO SOLOSTAR 300 UNIT/ML Solostar Pen INJECT 18-20 UNITS INTO THE SKIN AT BEDTIME. (BOX MUST LAST 66 DAYS PER INSURANCE) 4.5 mL 2   No current facility-administered medications on file prior to visit.   No Known Allergies Family History  Problem Relation Age of Onset   Coronary artery disease Mother 16       Died age 43, CABG   CAD Father 85       Died age 31, CABG   CAD Brother 60       Stents   Heart failure Maternal Grandmother     PE: BP 130/84 (BP Location: Left Arm, Patient Position: Sitting, Cuff Size: Normal)   Pulse 72   Ht '5\' 9"'$  (1.753 m)   Wt 162 lb (73.5 kg)   SpO2 99%   BMI 23.92 kg/m  Wt Readings from Last 3 Encounters:  08/26/21 162 lb (73.5 kg)  04/28/21 157 lb 9.6 oz (71.5 kg)  12/23/20 154 lb 12.8 oz (70.2 kg)   Constitutional: normal weight, in NAD Eyes: PERRLA, EOMI, no  exophthalmos ENT: moist mucous membranes, no thyromegaly, no cervical lymphadenopathy Cardiovascular: RRR, No MRG, + wears compression hoses for leg swelling  Respiratory: CTA B Musculoskeletal: no deformities, strength intact in all 4 Skin: moist, warm, no rashes Neurological: no tremor with outstretched hands, DTR normal in all 4 Diabetic Foot Exam - Simple   Simple Foot Form Diabetic Foot exam was performed with the following findings: Yes 08/26/2021 11:06 AM  Visual Inspection No deformities, no ulcerations, no other skin breakdown bilaterally: Yes Sensation Testing Intact to touch and monofilament testing bilaterally: Yes Pulse Check Posterior Tibialis and Dorsalis pulse intact bilaterally: Yes Comments     ASSESSMENT: 1. DM1, insulin-dependent, uncontrolled, with complications - CAD - s/p DES 2017, CABG x1 04/2017 - PN  We diagnosed type 1 diabetes based on the low C-peptide suggesting insulin deficiency: Component     Latest Ref Rng & Units 12/31/2018  ZNT8 Antibodies     U/mL <15  Glutamic Acid Decarb Ab     <5 IU/mL <5  C-Peptide     0.80 - 3.85 ng/mL <0.10 (L)  Glucose, Plasma     65 - 99 mg/dL 94  POC Glucose     70 - 99 mg/dl 132 (A)  Islet Cell Ab     Neg:<1:1 Negative   2. HL  PLAN:  1. Patient with longstanding, insulin-dependent type 1 diabetes, diagnosed as such in 12/2018, when he was found to have low insulin production.  His sugars are very fluctuating, consistent with insulin deficiency.  He had improvement in blood sugars after switching to Humalog to Lyumjev and adjusting his insulin to carb ratios.  At last visits, we discussed about trying to avoid snacking between meals and carry glucose tablets or gel with him to prevent hypoglycemia if needed. -At last visit, sugars were  much worse than before.  They were widely fluctuating, with significant hyperglycemic values after breakfast, which remained elevated for a long time afterwards.  They were  improving in the afternoon with a nadir around 7 PM.  After dinner, sugars are variable, sometimes very well controlled, but sometimes increasing to the mid 200s and even 300s.  She was not sure why he is controlled deteriorated, but he mentioned that he was bolusing too late after the meal.  He was waiting for the sugars to be high on the CGM before bolusing.  We discussed about the delay between blood and interstitial fluid sugars and discussed about always bolusing before meals.  I also advised him to take into account 50% of the protein weight as carbs, especially if he has a low-carb meal.  HbA1c at last visit was 7.4%, lower. CGM interpretation: -At today's visit, we reviewed his CGM downloads: It appears that 71% of values are in target range (goal >70%), while 26% are higher than 180 (goal <25%), and 3% are lower than 70 (goal <4%).  The calculated average blood sugar is 148.  The projected HbA1c for the next 3 months (GMI) is 6.9%. -Reviewing the CGM trends, sugars are significantly improved from last visit, now without hyperglycemic peak in the middle of the day.  Actually, sugars staying mostly in the target range, with occasional high blood sugars after dinner.  Upon questioning, he is eating carbs at night and may not cover all of them with insulin.  We discussed about using a more strict insulin to carb ratio with starches.  His sugars are dropping overnight, but not frequently in the hypoglycemia range.  However, since his sugars are also dropping during the day, under 70s, I advised him to decrease the Semglee dose.  Otherwise, we will keep the same Lyumjev parameters. - I suggested to:  Patient Instructions  Please use: - Semglee 16 units in a.m. - Lyumjev ICR:  - 1:8, except 1:10 for lunch - 1:6 with starches: rice, pasta, potatoes, etc. Target: 120 Sensitivity factor: 1:40  Count 50% of the proteins in a meal as carbs for low-carb meal.  Please do the following approximately  before every meal: - count carbs (C) - check sugars (S) - inject insulin (I)  Please return in 4 months.   - we checked his HbA1c: 6.7% (best in a long time!) - advised to check sugars at different times of the day - 4x a day, rotating check times - advised for yearly eye exams >> he is UTD - return to clinic in 3-4 months  2. HL -Reviewed latest lipid panel from 10/2020: All fractions at goal: Lab Results  Component Value Date   CHOL 104 10/07/2020   HDL 40 10/07/2020   LDLCALC 50 10/07/2020   TRIG 66 10/07/2020   CHOLHDL 2.6 10/07/2020  -Continues Crestor 40 mg daily without side effects  Philemon Kingdom, MD PhD [facility] Endocrinology

## 2021-08-27 ENCOUNTER — Other Ambulatory Visit: Payer: Self-pay | Admitting: Dermatology

## 2021-09-21 ENCOUNTER — Other Ambulatory Visit: Payer: Self-pay | Admitting: Cardiology

## 2021-09-26 ENCOUNTER — Encounter: Payer: Self-pay | Admitting: Dermatology

## 2021-09-26 ENCOUNTER — Ambulatory Visit (INDEPENDENT_AMBULATORY_CARE_PROVIDER_SITE_OTHER): Payer: Commercial Managed Care - PPO | Admitting: Dermatology

## 2021-09-26 DIAGNOSIS — L570 Actinic keratosis: Secondary | ICD-10-CM

## 2021-09-26 DIAGNOSIS — Z85828 Personal history of other malignant neoplasm of skin: Secondary | ICD-10-CM

## 2021-10-14 ENCOUNTER — Encounter: Payer: Self-pay | Admitting: Internal Medicine

## 2021-10-14 DIAGNOSIS — E1059 Type 1 diabetes mellitus with other circulatory complications: Secondary | ICD-10-CM

## 2021-10-18 ENCOUNTER — Other Ambulatory Visit: Payer: Self-pay | Admitting: Cardiology

## 2021-10-18 MED ORDER — FREESTYLE LIBRE READER DEVI: 0 refills | Status: DC

## 2021-10-18 MED ORDER — FREESTYLE LIBRE 3 SENSOR MISC: 3 refills | Status: DC

## 2021-10-22 ENCOUNTER — Encounter: Payer: Self-pay | Admitting: Dermatology

## 2021-10-22 NOTE — Progress Notes (Signed)
   Follow-Up Visit   Subjective  Kelly Hardy is a 61 y.o. male who presents for the following: Follow-up (Treating with adara postive reaction left side burn).  Check left sideburn Location:  Duration:  Quality:  Associated Signs/Symptoms: Modifying Factors:  Severity:  Timing: Context:   Objective  Well appearing patient in no apparent distress; mood and affect are within normal limits. Left Parotid Area Patient has been quite compliant with use of Aldara with expected inflamed crust.     Left Parotid Area Inflamed crust at site of Aldara treatment carcinoma in situ (biopsy February 2021).  We will discontinue the topical therapy.    A focused examination was performed including head and neck. Relevant physical exam findings are noted in the Assessment and Plan.   Assessment & Plan    AK (actinic keratosis) Left Parotid Area  Discontinue the Aldara, use over-the-counter hydrocortisone daily for 2 to 3 weeks, return for possible biopsy if there is residual crust in 3 months.  Personal history of skin cancer Left Parotid Area  If there is still residual crust and 2 to 3 months, he should return for repeat biopsy.      I, Lavonna Monarch, MD, have reviewed all documentation for this visit.  The documentation on 10/22/21 for the exam, diagnosis, procedures, and orders are all accurate and complete.

## 2021-10-26 ENCOUNTER — Encounter: Payer: Self-pay | Admitting: Cardiology

## 2021-10-26 NOTE — Telephone Encounter (Signed)
Error

## 2021-11-22 ENCOUNTER — Other Ambulatory Visit: Payer: Self-pay | Admitting: Cardiology

## 2021-11-27 NOTE — H&P (View-Only) (Signed)
Cardiology Office Note   Date:  11/28/2021   ID:  Kelly, Hardy 1960/07/15, MRN [id number]  PCP:  [name], MD  Cardiologist:   [name], MD    Chief Complaint  Patient presents with   Chest Pain      History of Present Illness: 61 y/o Caucasian male with complaints of exertional chest pain 02/01/16. OP stress Myoview done 02/09/16 showed diaphragmatic attenuation with 52m inferior ST depression and chest pain. [facility]% m[facility]. He recieved a DES with good results.  I saw him in follow up and he had increased angina and was admitted for cath.  This demonstrated dLM-ostial LAD 90% stenosis and 45% mLAD stenosis. The RCA stent was patent. He was discharged ad re admitted Jan 2nd 2019 for elective CABG x 1 with an LIMA-LAD. He tolerated this well. EF was 55-60%.  He returns for follow up.    Since I last saw him he has had for the last couple of months chest pain when pushing a lawnmower.  This is been reminiscent of his previous discomfort.  It is new onset exertional discomfort.  He has a tightness in his chest with some mild shortness of breath.  He is not sure if he is diaphoretic because he is usually warm outside.  Is not having any neck or jaw discomfort.  He is not having any PND or orthopnea.  He is not having any palpitations and, presyncope or syncope.  It is slowly progressive.  He is not having any resting symptoms.  He stops and it will go away.  He can sometimes just kind of work through it.   Past Medical History:  Diagnosis Date   Coronary artery disease    Diabetes mellitus without complication ([place]    Type I   GERD (gastroesophageal reflux disease)    Hyperlipidemia    Hypertension    Palpitations    eval 2014   Squamous cell carcinoma of skin 05/26/1999   left sideburn tx with bx    Past Surgical History:  Procedure Laterality Date   CARDIAC CATHETERIZATION N/A 02/10/2016    Procedure: Left Heart Cath and Coronary Angiography;  Surgeon: TTroy Sine MD;  [facility];  Service: Cardiovascular;  Laterality: N/A;   CARDIAC CATHETERIZATION N/A 02/10/2016   Procedure: Coronary Stent Intervention;  Surgeon: TTroy Sine MD;  [facility];  Service: Cardiovascular;  Laterality: N/A;   CORONARY ARTERY BYPASS GRAFT N/A 04/04/2017   Procedure: CORONARY ARTERY BYPASS GRAFTING (CABG), times one, using left internal mammary artery. Off pump;  Surgeon: [name] MD;  [place] [facility]  Service: Open Heart Surgery;  Laterality: N/A;   CORONARY STENT PLACEMENT  02/10/2016   STENT XIENCE ALPINE RX 3.25X15 drug eluting stent was successfully placed   LEFT HEART CATH AND CORONARY ANGIOGRAPHY N/A 03/23/2017   Procedure: LEFT HEART CATH AND CORONARY ANGIOGRAPHY;  Surgeon: [name], MD;  [facility];  Service: Cardiovascular;  Laterality: N/A;   SALIVARY GLAND SURGERY Left    benign   TEE WITHOUT CARDIOVERSION N/A 04/04/2017   Procedure: TRANSESOPHAGEAL ECHOCARDIOGRAM (TEE);  Surgeon: [name] MD;  [place] [facility]  Service: Open Heart Surgery;  Laterality: N/A;     Current Outpatient Medications  Medication Sig Dispense Refill   acetaminophen (TYLENOL) 500 MG tablet Take 2  tablets (1,000 mg total) by mouth every 8 (eight) hours as needed. 30 tablet 0   aspirin EC 81 MG tablet Take 81 mg by mouth daily.     Continuous Blood Gluc Receiver (FREESTYLE LIBRE 2 READER) DEVI 1 each by Does not apply route daily. 1 each 0   Continuous Blood Gluc Receiver (FREESTYLE LIBRE READER) DEVI Use Libre 3 as instructed to check blood sugar 1 each 0   Continuous Blood Gluc Sensor (FREESTYLE LIBRE 2 SENSOR) MISC APPLY A NEW SENSOR EVERY 14 DAYS. 6 each 3   Continuous Blood Gluc Sensor (FREESTYLE LIBRE 3 SENSOR) MISC Use as instructed to check blood sugar. 6 each 3   EASY COMFORT PEN NEEDLES 31G X 5 MM MISC USE AS  DIRECTED FOUR TO SIX TIMES DAILY 400 each 3   Glucagon 3 MG/DOSE POWD Place 3 mg into the nose once as needed for up to 1 dose. 1 each 11   Insulin Lispro-aabc (LYUMJEV KWIKPEN) 100 UNIT/ML KwikPen INJECT UP TO 30 UNITS UNDER THE SKIN DAILY AS DIRECTED 30 mL 3   Insulin Pen Needle 32G X 4 MM MISC Use 4-6x a day 400 each 3   losartan (COZAAR) 25 MG tablet TAKE ONE TABLET BY MOUTH DAILY. 90 tablet 3   metoprolol succinate (TOPROL-XL) 50 MG 24 hr tablet Take 1 tablet (50 mg total) by mouth daily. Please schedule appointment for further refills 30 tablet 1   Multiple Vitamins-Minerals (MULTIVITAMIN WITH MINERALS) tablet Take 1 tablet by mouth daily.     rosuvastatin (CRESTOR) 40 MG tablet TAKE ONE TABLET BY MOUTH ONCE DAILY 30 tablet 0   tadalafil (CIALIS) 5 MG tablet Take 1 tablet (5 mg total) by mouth daily as needed for erectile dysfunction. 10 tablet 0   TOUJEO SOLOSTAR 300 UNIT/ML Solostar Pen INJECT 18-20 UNITS INTO THE SKIN AT BEDTIME. (BOX MUST LAST 66 DAYS PER INSURANCE) 4.5 mL 2   No current facility-administered medications for this visit.    Allergies:   Patient has no known allergies.    ROS:  Please see the history of present illness.   Otherwise, review of systems are positive for none.   All other systems are reviewed and negative.    PHYSICAL EXAM: VS:  BP (!) 150/76   Pulse 73   Ht '5\' 9"'$  (1.753 m)   Wt 158 lb (71.7 kg)   SpO2 99%   BMI 23.33 kg/m  , BMI Body mass index is 23.33 kg/m.  GENERAL:  Well appearing NECK:  No jugular venous distention, waveform within normal limits, carotid upstroke brisk and symmetric, no bruits, no thyromegaly LUNGS:  Clear to auscultation bilaterally CHEST:  Well healed sternotomy scar. HEART:  PMI not displaced or sustained,S1 and S2 within normal limits, no S3, no S4, no clicks, no rubs, no murmurs ABD:  Flat, positive bowel sounds normal in frequency in pitch, no bruits, no rebound, no guarding, no midline pulsatile mass, no  hepatomegaly, no splenomegaly EXT:  2 plus pulses throughout, no edema, no cyanosis no clubbing   EKG:  EKG is   ordered today. Sinus rhythm, rate 73, axis within normal limits, intervals within normal limits, no acute ST-T wave changes.   Recent Labs: No results found for requested labs within last 365 days.     Lab Results  Component Value Date   HGBA1C 6.7 (A) 08/26/2021    Wt Readings from Last 3 Encounters:  11/28/21 158 lb (71.7 kg)  08/26/21 162 lb (73.5 kg)  04/28/21 157 lb 9.6 oz (71.5 kg)     Other studies Reviewed: Additional studies/ records that were reviewed today include:  Labs  Review of the above records demonstrates: See elsewhere  ASSESSMENT AND PLAN:  CABG: The patient has new onset exertional symptoms consistent with unstable angina.  His symptoms have been fairly predictable and consistent in the past when he has had new obstructive coronary disease.  I think the pretest probability of obstructive coronary disease is high.  Cardiac catheter is indicated.  The patient understands that risks included but are not limited to stroke (1 in 1000), death (1 in 24), kidney failure [usually temporary] (1 in 500), bleeding (1 in 200), allergic reaction [possibly serious] (1 in 200).  The patient understands and agrees to proceed.    DYSLIPIDEMIA:    LDL was 50 in July with an HDL of 40 he is due for repeat lipids.  HTN:    The blood pressure is mildly elevated but he says that typically it seems to be in the 1 02-5 30 systolic range.   DM TYPE I:  A1c was down to 6.7 from 7.4.  This is followed by Dr. Quintin Alto.    ED: I think would be reasonable to refill his Cialis after the above work-up and he understands he cannot use nitrates.  Current medicines are reviewed at length with the patient today.  The patient does not have concerns regarding medicines.  The following changes have been made: As above  Labs/ tests ordered today include:     Orders Placed This  Encounter  Procedures   CBC   Lipid panel   Basic metabolic panel   EKG 85-IDPO    Disposition:   Follow up with me or APP after the cath.   Signed, [name], MD  11/28/2021 9:53 AM    [facility]

## 2021-11-27 NOTE — Progress Notes (Unsigned)
Cardiology Office Note   Date:  11/28/2021   ID:  Kelly Hardy, Kelly Hardy 07-Jul-1960, MRN [id number]  PCP:  Kelly Hilding, MD  Cardiologist:   [name], MD    Chief Complaint  Patient presents with   Chest Pain      History of Present Illness: 61 y/o Caucasian male with complaints of exertional chest pain 02/01/16. OP stress Myoview done 02/09/16 showed diaphragmatic attenuation with 38m inferior ST depression and chest pain. [facility]% [facility]. He recieved a DES with good results.  I saw him in follow up and he had increased angina and was admitted for cath.  This demonstrated dLM-ostial LAD 90% stenosis and 45% mLAD stenosis. The RCA stent was patent. He was discharged ad re admitted Jan 2nd 2019 for elective CABG x 1 with an LIMA-LAD. He tolerated this well. EF was 55-60%.  He returns for follow up.    Since I last saw him he has had for the last couple of months chest pain when pushing a lawnmower.  This is been reminiscent of his previous discomfort.  It is new onset exertional discomfort.  He has a tightness in his chest with some mild shortness of breath.  He is not sure if he is diaphoretic because he is usually warm outside.  Is not having any neck or jaw discomfort.  He is not having any PND or orthopnea.  He is not having any palpitations and, presyncope or syncope.  It is slowly progressive.  He is not having any resting symptoms.  He stops and it will go away.  He can sometimes just kind of work through it.   Past Medical History:  Diagnosis Date   Coronary artery disease    Di[place]    Type I   GERD (gastroesophageal reflux disease)    Hyperlipidemia    Hypertension    Palpitations    eval 2014   Squamous cell carcinoma of skin 05/26/1999   left sideburn tx with bx    Past Surgical History:  Procedure Laterality Date   CARDIAC CATHETERIZATION N/A 02/10/2016    Procedure: Left Heart Cath and Coronary Angiography;  Surgeon: TTroy Sine MD;  [facility];  Service: Cardiovascular;  Laterality: N/A;   CARDIAC CATHETERIZATION N/A 02/10/2016   Procedure: Coronary Stent Intervention;  Surgeon: TTroy Sine MD;  [facility];  Service: Cardiovascular;  Laterality: N/A;   CORONARY ARTERY BYPASS GRAFT N/A 04/04/2017   Procedure: CORONARY ARTERY BYPASS GRAFTING (CABG), times one, using left internal mammary artery. Off pump;  Surgeon: [name] MD;  [facility]  Service: Open Heart Surgery;  Laterality: N/A;   CORONARY STENT PLACEMENT  02/10/2016   STENT XIENCE ALPINE RX 3.25X15 drug eluting stent was successfully placed   LEFT HEART CATH AND CORONARY ANGIOGRAPHY N/A 03/23/2017   Procedure: LEFT HEART CATH AND CORONARY ANGIOGRAPHY;  Surgeon: [name], MD;  [facility];  Service: Cardiovascular;  Laterality: N/A;   SALIVARY GLAND SURGERY Left    benign   TEE WITHOUT CARDIOVERSION N/A 04/04/2017   Procedure: TRANSESOPHAGEAL ECHOCARDIOGRAM (TEE);  Surgeon: [name] MD;  [facility] [facility]  Service: Open Heart Surgery;  Laterality: N/A;     Current Outpatient Medications  Medication Sig Dispense Refill   acetaminophen (TYLENOL) 500 MG tablet Take 2  tablets (1,000 mg total) by mouth every 8 (eight) hours as needed. 30 tablet 0   aspirin EC 81 MG tablet Take 81 mg by mouth daily.     Continuous Blood Gluc Receiver (FREESTYLE LIBRE 2 READER) DEVI 1 each by Does not apply route daily. 1 each 0   Continuous Blood Gluc Receiver (FREESTYLE LIBRE READER) DEVI Use Libre 3 as instructed to check blood sugar 1 each 0   Continuous Blood Gluc Sensor (FREESTYLE LIBRE 2 SENSOR) MISC APPLY A NEW SENSOR EVERY 14 DAYS. 6 each 3   Continuous Blood Gluc Sensor (FREESTYLE LIBRE 3 SENSOR) MISC Use as instructed to check blood sugar. 6 each 3   EASY COMFORT PEN NEEDLES 31G X 5 MM MISC USE AS  DIRECTED FOUR TO SIX TIMES DAILY 400 each 3   Glucagon 3 MG/DOSE POWD Place 3 mg into the nose once as needed for up to 1 dose. 1 each 11   Insulin Lispro-aabc (LYUMJEV KWIKPEN) 100 UNIT/ML KwikPen INJECT UP TO 30 UNITS UNDER THE SKIN DAILY AS DIRECTED 30 mL 3   Insulin Pen Needle 32G X 4 MM MISC Use 4-6x a day 400 each 3   losartan (COZAAR) 25 MG tablet TAKE ONE TABLET BY MOUTH DAILY. 90 tablet 3   metoprolol succinate (TOPROL-XL) 50 MG 24 hr tablet Take 1 tablet (50 mg total) by mouth daily. Please schedule appointment for further refills 30 tablet 1   Multiple Vitamins-Minerals (MULTIVITAMIN WITH MINERALS) tablet Take 1 tablet by mouth daily.     rosuvastatin (CRESTOR) 40 MG tablet TAKE ONE TABLET BY MOUTH ONCE DAILY 30 tablet 0   tadalafil (CIALIS) 5 MG tablet Take 1 tablet (5 mg total) by mouth daily as needed for erectile dysfunction. 10 tablet 0   TOUJEO SOLOSTAR 300 UNIT/ML Solostar Pen INJECT 18-20 UNITS INTO THE SKIN AT BEDTIME. (BOX MUST LAST 66 DAYS PER INSURANCE) 4.5 mL 2   No current facility-administered medications for this visit.    Allergies:   Patient has no known allergies.    ROS:  Please see the history of present illness.   Otherwise, review of systems are positive for none.   All other systems are reviewed and negative.    PHYSICAL EXAM: VS:  BP (!) 150/76   Pulse 73   Ht '5\' 9"'$  (1.753 m)   Wt 158 lb (71.7 kg)   SpO2 99%   BMI 23.33 kg/m  , BMI Body mass index is 23.33 kg/m.  GENERAL:  Well appearing NECK:  No jugular venous distention, waveform within normal limits, carotid upstroke brisk and symmetric, no bruits, no thyromegaly LUNGS:  Clear to auscultation bilaterally CHEST:  Well healed sternotomy scar. HEART:  PMI not displaced or sustained,S1 and S2 within normal limits, no S3, no S4, no clicks, no rubs, no murmurs ABD:  Flat, positive bowel sounds normal in frequency in pitch, no bruits, no rebound, no guarding, no midline pulsatile mass, no  hepatomegaly, no splenomegaly EXT:  2 plus pulses throughout, no edema, no cyanosis no clubbing   EKG:  EKG is   ordered today. Sinus rhythm, rate 73, axis within normal limits, intervals within normal limits, no acute ST-T wave changes.   Recent Labs: No results found for requested labs within last 365 days.     Lab Results  Component Value Date   HGBA1C 6.7 (A) 08/26/2021    Wt Readings from Last 3 Encounters:  11/28/21 158 lb (71.7 kg)  08/26/21 162 lb (73.5 kg)  04/28/21 157 lb 9.6 oz (71.5 kg)     Other studies Reviewed: Additional studies/ records that were reviewed today include:  Labs  Review of the above records demonstrates: See elsewhere  ASSESSMENT AND PLAN:  CABG: The patient has new onset exertional symptoms consistent with unstable angina.  His symptoms have been fairly predictable and consistent in the past when he has had new obstructive coronary disease.  I think the pretest probability of obstructive coronary disease is high.  Cardiac catheter is indicated.  The patient understands that risks included but are not limited to stroke (1 in 1000), death (1 in 55), kidney failure [usually temporary] (1 in 500), bleeding (1 in 200), allergic reaction [possibly serious] (1 in 200).  The patient understands and agrees to proceed.    DYSLIPIDEMIA:    LDL was 50 in July with an HDL of 40 he is due for repeat lipids.  HTN:    The blood pressure is mildly elevated but he says that typically it seems to be in the 1 10-1 30 systolic range.   DM TYPE I:  A1c was down to 6.7 from 7.4.  This is followed by Dr. Quintin Alto.    ED: I think would be reasonable to refill his Cialis after the above work-up and he understands he cannot use nitrates.  Current medicines are reviewed at length with the patient today.  The patient does not have concerns regarding medicines.  The following changes have been made: As above  Labs/ tests ordered today include:     Orders Placed This  Encounter  Procedures   CBC   Lipid panel   Basic metabolic panel   EKG [id number]    Disposition:   Follow up with me or APP after the cath.   Signed, [name], MD  11/28/2021 9:53 AM    [facility]

## 2021-11-28 ENCOUNTER — Ambulatory Visit: Payer: Commercial Managed Care - PPO | Attending: Cardiology | Admitting: Cardiology

## 2021-11-28 ENCOUNTER — Encounter: Payer: Self-pay | Admitting: Cardiology

## 2021-11-28 VITALS — BP 150/76 | HR 73 | Ht 69.0 in | Wt 158.0 lb

## 2021-11-28 DIAGNOSIS — Z951 Presence of aortocoronary bypass graft: Secondary | ICD-10-CM

## 2021-11-28 DIAGNOSIS — E118 Type 2 diabetes mellitus with unspecified complications: Secondary | ICD-10-CM

## 2021-11-28 DIAGNOSIS — E785 Hyperlipidemia, unspecified: Secondary | ICD-10-CM | POA: Diagnosis not present

## 2021-11-28 DIAGNOSIS — Z01812 Encounter for preprocedural laboratory examination: Secondary | ICD-10-CM

## 2021-11-28 DIAGNOSIS — I10 Essential (primary) hypertension: Secondary | ICD-10-CM | POA: Diagnosis not present

## 2021-11-28 DIAGNOSIS — Z79899 Other long term (current) drug therapy: Secondary | ICD-10-CM

## 2021-11-28 DIAGNOSIS — N529 Male erectile dysfunction, unspecified: Secondary | ICD-10-CM

## 2021-11-28 NOTE — Patient Instructions (Signed)
Medication Instructions:  Your Physician recommend you continue on your current medication as directed.    *If you need a refill on your cardiac medications before your next appointment, please call your pharmacy*   Lab Work: Your physician recommends lab work today (CBC, BMP, Lipid)  If you have labs (blood work) drawn today and your tests are completely normal, you will receive your results only by: MyChart Message (if you have MyChart) OR A paper copy in the mail If you have any lab test that is abnormal or we need to change your treatment, we will call you to review the results.   Testing/Procedures: Your physician has requested that you have a cardiac catheterization. Cardiac catheterization is used to diagnose and/or treat various heart conditions. Doctors may recommend this procedure for a number of different reasons. The most common reason is to evaluate chest pain. Chest pain can be a symptom of coronary artery disease (CAD), and cardiac catheterization can show whether plaque is narrowing or blocking your heart's arteries. This procedure is also used to evaluate the valves, as well as measure the blood flow and oxygen levels in different parts of your heart. For further information please visit [email or web address]. Please follow instruction sheet, as given. [place]   Follow-Up: At [facility], you and your health needs are our priority.  As part of our continuing mission to provide you with exceptional heart care, we have created designated Provider Care Teams.  These Care Teams include your primary Cardiologist (physician) and Advanced Practice Providers (APPs -  Physician Assistants and Nurse Practitioners) who all work together to provide you with the care you need, when you need it.  We recommend signing up for the patient portal called "MyChart".  Sign up information is provided on this After Visit Summary.  MyChart is used to connect with patients for Virtual Visits  (Telemedicine).  Patients are able to view lab/test results, encounter notes, upcoming appointments, etc.  Non-urgent messages can be sent to your provider as well.   To learn more about what you can do with MyChart, go to [email or web address].    Your next appointment:   2 week(s)  The format for your next appointment:   In Person  Provider:   [name], MD or APP     [place] A DEPT OF [place] A DEPT OF [place]. [facility] [place] [phone] [place] [address] Dept: [phone] Loc: [place]  11/28/2021  You are scheduled for a Cardiac Catheterization on Thursday, August 31 with Dr. Larae Grooms.  1. Please arrive at the [facility] (Main Entrance A) at [facility]: [address] [place], [place] [address] at 8:30 AM (This time is two hours before your procedure to ensure your preparation). Free valet parking service is available.   Special note: Every effort is made to have your procedure done on time. Please understand that emergencies sometimes delay scheduled procedures.  2. Diet: Do not eat solid foods after midnight.  The patient may have clear liquids until 5am upon the day of the procedure.  3. Labs: You will need to have blood drawn today (CBC, BMP, Lipid)  4. Medication instructions in preparation for your procedure:   Contrast Allergy: No   Take only 1/2  units of insulin the night before your procedure. Do not take any insulin on the day of the procedure.   On  the morning of your procedure, take your Aspirin and any morning medicines NOT listed above.  You may use sips of water.  5. Plan for one night stay--bring personal belongings. 6. Bring a current list of your medications and current insurance cards. 7. You MUST have a responsible person to drive you home. 8. Someone MUST be with you the first 24 hours after  you arrive home or your discharge will be delayed. 9. Please wear clothes that are easy to get on and off and wear slip-on shoes.  Thank you for allowing [place] to care for you!   -- [facility]

## 2021-11-29 ENCOUNTER — Telehealth: Payer: Self-pay | Admitting: *Deleted

## 2021-11-29 LAB — CBC
Hematocrit: 35 % — ABNORMAL LOW (ref 37.5–51.0)
Hemoglobin: 12 g/dL — ABNORMAL LOW (ref 13.0–17.7)
MCH: 31.7 pg (ref 26.6–33.0)
MCHC: 34.3 g/dL (ref 31.5–35.7)
MCV: 93 fL (ref 79–97)
Platelets: 221 10*3/uL (ref 150–450)
RBC: 3.78 x10E6/uL — ABNORMAL LOW (ref 4.14–5.80)
RDW: 12.3 % (ref 11.6–15.4)
WBC: 7.6 10*3/uL (ref 3.4–10.8)

## 2021-11-29 LAB — LIPID PANEL
Chol/HDL Ratio: 3.2 ratio (ref 0.0–5.0)
Cholesterol, Total: 133 mg/dL (ref 100–199)
HDL: 41 mg/dL (ref >39)
LDL Chol Calc (NIH): 78 mg/dL (ref 0–99)
Triglycerides: 68 mg/dL (ref 0–149)
VLDL Cholesterol Cal: 14 mg/dL (ref 5–40)

## 2021-11-29 LAB — BASIC METABOLIC PANEL
BUN/Creatinine Ratio: 12 (ref 10–24)
BUN: 13 mg/dL (ref 8–27)
CO2: 25 mmol/L (ref 20–29)
Calcium: 9.7 mg/dL (ref 8.6–10.2)
Chloride: 103 mmol/L (ref 96–106)
Creatinine, Ser: 1.09 mg/dL (ref 0.76–1.27)
Glucose: 190 mg/dL — ABNORMAL HIGH (ref 70–99)
Potassium: 4.5 mmol/L (ref 3.5–5.2)
Sodium: 141 mmol/L (ref 134–144)
eGFR: 77 mL/min/{1.73_m2} (ref >59)

## 2021-11-29 NOTE — Telephone Encounter (Signed)
Cardiac Catheterization scheduled at [facility] for: Thursday December 01, 2021 9 AM  Arrival time and place: [facility] Main Entrance A at: 7 AM    Nothing to eat after midnight prior to procedure, clear liquids until 5 AM day of procedure.  Medication instructions: -Insulin -AM of procedure/1/2 usual Insulin dose HS prior to procedure -Except hold medications usual morning medications can be taken with sips of water including aspirin 81 mg.  Confirmed patient has responsible adult to drive home post procedure and be with patient first 24 hours after arriving home.  Patient reports no new symptoms concerning for COVID-19 in the past 10 days.  Reviewed procedure instructions with patient.

## 2021-12-01 ENCOUNTER — Encounter (HOSPITAL_COMMUNITY)
Admission: RE | Disposition: A | Discharge status: Home / Self Care | Payer: Self-pay | Source: Home / Self Care | Attending: Interventional Cardiology

## 2021-12-01 ENCOUNTER — Ambulatory Visit (HOSPITAL_COMMUNITY)
Admission: RE | Admit: 2021-12-01 | Discharge: 2021-12-01 | Disposition: A | Discharge status: Home / Self Care | Payer: Commercial Managed Care - PPO | Attending: Interventional Cardiology | Admitting: Interventional Cardiology

## 2021-12-01 ENCOUNTER — Encounter (HOSPITAL_COMMUNITY): Payer: Self-pay | Admitting: Interventional Cardiology

## 2021-12-01 ENCOUNTER — Other Ambulatory Visit: Payer: Self-pay

## 2021-12-01 DIAGNOSIS — I2584 Coronary atherosclerosis due to calcified coronary lesion: Secondary | ICD-10-CM | POA: Insufficient documentation

## 2021-12-01 DIAGNOSIS — E109 Type 1 diabetes mellitus without complications: Secondary | ICD-10-CM | POA: Insufficient documentation

## 2021-12-01 DIAGNOSIS — I10 Essential (primary) hypertension: Secondary | ICD-10-CM | POA: Insufficient documentation

## 2021-12-01 DIAGNOSIS — Z955 Presence of coronary angioplasty implant and graft: Secondary | ICD-10-CM | POA: Insufficient documentation

## 2021-12-01 DIAGNOSIS — Z794 Long term (current) use of insulin: Secondary | ICD-10-CM | POA: Insufficient documentation

## 2021-12-01 DIAGNOSIS — E785 Hyperlipidemia, unspecified: Secondary | ICD-10-CM | POA: Insufficient documentation

## 2021-12-01 DIAGNOSIS — I25118 Atherosclerotic heart disease of native coronary artery with other forms of angina pectoris: Secondary | ICD-10-CM | POA: Diagnosis present

## 2021-12-01 DIAGNOSIS — Z951 Presence of aortocoronary bypass graft: Secondary | ICD-10-CM | POA: Insufficient documentation

## 2021-12-01 HISTORY — PX: LEFT HEART CATH AND CORS/GRAFTS ANGIOGRAPHY: CATH118250

## 2021-12-01 LAB — GLUCOSE, CAPILLARY
Glucose-Capillary: 154 mg/dL — ABNORMAL HIGH (ref 70–99)
Glucose-Capillary: 167 mg/dL — ABNORMAL HIGH (ref 70–99)

## 2021-12-01 SURGERY — LEFT HEART CATH AND CORS/GRAFTS ANGIOGRAPHY
Anesthesia: LOCAL

## 2021-12-01 MED ORDER — LIDOCAINE HCL (PF) 1 % IJ SOLN
INTRAMUSCULAR | Status: AC
  Filled 2021-12-01: qty 30

## 2021-12-01 MED ORDER — SODIUM CHLORIDE 0.9% FLUSH: 3.0000 mL | INTRAVENOUS | Status: DC | PRN

## 2021-12-01 MED ORDER — SODIUM CHLORIDE 0.9% FLUSH: 3.0000 mL | Freq: Two times a day (BID) | INTRAVENOUS | Status: DC

## 2021-12-01 MED ORDER — MIDAZOLAM HCL 2 MG/2ML IJ SOLN
INTRAMUSCULAR | Status: DC | PRN
  Administered 2021-12-01: 1 mg via INTRAVENOUS
  Administered 2021-12-01: 2 mg via INTRAVENOUS

## 2021-12-01 MED ORDER — SODIUM CHLORIDE 0.9 % IV SOLN: 250.0000 mL | INTRAVENOUS | Status: DC | PRN

## 2021-12-01 MED ORDER — HEPARIN (PORCINE) IN NACL 1000-0.9 UT/500ML-% IV SOLN
INTRAVENOUS | Status: AC
Start: 2021-12-01 — End: ?
  Filled 2021-12-01: qty 500

## 2021-12-01 MED ORDER — SODIUM CHLORIDE 0.9% FLUSH
3.0000 mL | Freq: Two times a day (BID) | INTRAVENOUS | Status: DC
Start: 2021-12-01 — End: 2021-12-01

## 2021-12-01 MED ORDER — ONDANSETRON HCL 4 MG/2ML IJ SOLN: 4.0000 mg | Freq: Four times a day (QID) | INTRAMUSCULAR | Status: DC | PRN

## 2021-12-01 MED ORDER — HEPARIN SODIUM (PORCINE) 1000 UNIT/ML IJ SOLN
INTRAMUSCULAR | Status: AC
  Filled 2021-12-01: qty 10

## 2021-12-01 MED ORDER — LIDOCAINE HCL (PF) 1 % IJ SOLN
INTRAMUSCULAR | Status: DC | PRN
  Administered 2021-12-01: 2 mL

## 2021-12-01 MED ORDER — SODIUM CHLORIDE 0.9 % WEIGHT BASED INFUSION: 1.0000 mL/kg/h | INTRAVENOUS | Status: DC

## 2021-12-01 MED ORDER — SODIUM CHLORIDE 0.9 % IV SOLN
250.0000 mL | INTRAVENOUS | Status: DC | PRN
Start: 2021-12-01 — End: 2021-12-01

## 2021-12-01 MED ORDER — SODIUM CHLORIDE 0.9 % WEIGHT BASED INFUSION
3.0000 mL/kg/h | INTRAVENOUS | Status: AC
  Administered 2021-12-01: 3 mL/kg/h via INTRAVENOUS

## 2021-12-01 MED ORDER — LABETALOL HCL 5 MG/ML IV SOLN: 10.0000 mg | INTRAVENOUS | Status: DC | PRN

## 2021-12-01 MED ORDER — ACETAMINOPHEN 325 MG PO TABS: 650.0000 mg | ORAL_TABLET | ORAL | Status: DC | PRN

## 2021-12-01 MED ORDER — HEPARIN (PORCINE) IN NACL 1000-0.9 UT/500ML-% IV SOLN
INTRAVENOUS | Status: DC | PRN
  Administered 2021-12-01 (×2): 500 mL

## 2021-12-01 MED ORDER — ASPIRIN 81 MG PO CHEW
81.0000 mg | CHEWABLE_TABLET | ORAL | Status: AC
  Administered 2021-12-01: 81 mg via ORAL
  Filled 2021-12-01: qty 1

## 2021-12-01 MED ORDER — VERAPAMIL HCL 2.5 MG/ML IV SOLN
INTRAVENOUS | Status: AC
  Filled 2021-12-01: qty 2

## 2021-12-01 MED ORDER — HYDRALAZINE HCL 20 MG/ML IJ SOLN: 10.0000 mg | INTRAMUSCULAR | Status: DC | PRN

## 2021-12-01 MED ORDER — FENTANYL CITRATE (PF) 100 MCG/2ML IJ SOLN
INTRAMUSCULAR | Status: AC
  Filled 2021-12-01: qty 2

## 2021-12-01 MED ORDER — HEPARIN SODIUM (PORCINE) 1000 UNIT/ML IJ SOLN
INTRAMUSCULAR | Status: DC | PRN
  Administered 2021-12-01: 3500 [IU] via INTRAVENOUS

## 2021-12-01 MED ORDER — IOHEXOL 350 MG/ML SOLN
INTRAVENOUS | Status: DC | PRN
  Administered 2021-12-01: 65 mL

## 2021-12-01 MED ORDER — SODIUM CHLORIDE 0.9 % IV SOLN: INTRAVENOUS | Status: DC

## 2021-12-01 MED ORDER — VERAPAMIL HCL 2.5 MG/ML IV SOLN
INTRAVENOUS | Status: DC | PRN
  Administered 2021-12-01: 10 mL via INTRA_ARTERIAL

## 2021-12-01 MED ORDER — MIDAZOLAM HCL 2 MG/2ML IJ SOLN
INTRAMUSCULAR | Status: AC
  Filled 2021-12-01: qty 2

## 2021-12-01 MED ORDER — FENTANYL CITRATE (PF) 100 MCG/2ML IJ SOLN
INTRAMUSCULAR | Status: DC | PRN
  Administered 2021-12-01 (×2): 25 ug via INTRAVENOUS

## 2021-12-01 MED ORDER — SODIUM CHLORIDE 0.9% FLUSH
3.0000 mL | INTRAVENOUS | Status: DC | PRN
Start: 2021-12-01 — End: 2021-12-01

## 2021-12-01 SURGICAL SUPPLY — 11 items
BAND ZEPHYR COMPRESS 30 LONG (HEMOSTASIS) IMPLANT
CATH INFINITI 5 FR AR2 MOD (CATHETERS) IMPLANT
CATH INFINITI 5 FR IM (CATHETERS) IMPLANT
CATH INFINITI 5FR MULTPACK ANG (CATHETERS) IMPLANT
GLIDESHEATH SLEND SS 6F .021 (SHEATH) IMPLANT
GUIDEWIRE INQWIRE 1.5J.035X260 (WIRE) IMPLANT
INQWIRE 1.5J .035X260CM (WIRE) ×1
KIT HEART LEFT (KITS) ×1 IMPLANT
PACK CARDIAC CATHETERIZATION (CUSTOM PROCEDURE TRAY) ×1 IMPLANT
TRANSDUCER W/STOPCOCK (MISCELLANEOUS) ×1 IMPLANT
TUBING CIL FLEX 10 FLL-RA (TUBING) ×1 IMPLANT

## 2021-12-01 NOTE — Interval H&P Note (Signed)
Cath Lab Visit (complete for each Cath Lab visit)  Clinical Evaluation Leading to the Procedure:   ACS: No.  Non-ACS:    Anginal Classification: CCS III  Anti-ischemic medical therapy: Maximal Therapy (2 or more classes of medications)  Non-Invasive Test Results: No non-invasive testing performed  Prior CABG: Previous CABG      History and Physical Interval Note:  12/01/2021 8:59 AM  Kelly Hardy  has presented today for surgery, with the diagnosis of unstable angina.  The various methods of treatment have been discussed with the patient and family. After consideration of risks, benefits and other options for treatment, the patient has consented to  Procedure(s): LEFT HEART CATH AND CORS/GRAFTS ANGIOGRAPHY (N/A) as a surgical intervention.  The patient's history has been reviewed, patient examined, no change in status, stable for surgery.  I have reviewed the patient's chart and labs.  Questions were answered to the patient's satisfaction.     Larae Grooms

## 2021-12-01 NOTE — Progress Notes (Signed)
TR BAND REMOVAL  LOCATION:    left radial  DEFLATED PER PROTOCOL:    Yes.    TIME BAND OFF / DRESSING APPLIED: 12/01/21 1145   SITE UPON ARRIVAL:    Level 0  SITE AFTER BAND REMOVAL:    Level 0  CIRCULATION SENSATION AND MOVEMENT:    Within Normal Limits   Yes.    COMMENTS:

## 2021-12-06 ENCOUNTER — Other Ambulatory Visit: Payer: Self-pay | Admitting: *Deleted

## 2021-12-06 DIAGNOSIS — E785 Hyperlipidemia, unspecified: Secondary | ICD-10-CM

## 2021-12-06 MED ORDER — EZETIMIBE 10 MG PO TABS: 10.0000 mg | ORAL_TABLET | Freq: Every day | ORAL | 3 refills | Status: DC

## 2021-12-09 ENCOUNTER — Encounter: Payer: Self-pay | Admitting: Cardiology

## 2021-12-15 NOTE — Progress Notes (Unsigned)
Cardiology Office Note   Date:  12/16/2021   ID:  Kelly Hardy, DOB November 12, 1960, MRN [id number]  PCP:  [name], MD  Cardiologist:   [name], MD    Chief Complaint  Patient presents with   Chest Pain     History of Present Illness: 61 y/o Caucasian male with complaints of exertional chest pain 02/01/16. OP stress Myoview done 02/09/16 showed diaphragmatic attenuation with 39m inferior ST depression and chest pain. [facility] 95% m[facility]CA. He recieved a DES with good results.  I saw him in follow up and he had increased angina and was admitted for cath.  This demonstrated dLM-ostial LAD 90% stenosis and 45% mLAD stenosis. The RCA stent was patent. He was discharged ad re admitted Jan 2nd 2019 for elective CABG x 1 with an LIMA-LAD. He tolerated this well. EF was 55-60%.  He returns for follow up.    He did have a cath and had patent bypass graft and stenting as below.  He still had some mild sternal discomfort but this is mild.  He is not having any new symptoms and denies any new shortness of breath, PND or orthopnea.  He had no new palpitations, presyncope or syncope.  He had no weight gain or edema.  Of note he did have a rash with Zetia and I have added this to his allergy list.   Past Medical History:  Diagnosis Date   Coronary artery disease    Di[place]    Type I   GERD (gastroesophageal reflux disease)    Hyperlipidemia    Hypertension    Palpitations    eval 2014   Squamous cell carcinoma of skin 05/26/1999   left sideburn tx with bx    Past Surgical History:  Procedure Laterality Date   CARDIAC CATHETERIZATION N/A 02/10/2016   Procedure: Left Heart Cath and Coronary Angiography;  Surgeon: TTroy Sine MD;  [facility];  Service: Cardiovascular;  Laterality: N/A;   CARDIAC CATHETERIZATION N/A 02/10/2016   Procedure: Coronary Stent Intervention;   Surgeon: TTroy Sine MD;  [facility];  Service: Cardiovascular;  Laterality: N/A;   CORONARY ARTERY BYPASS GRAFT N/A 04/04/2017   Procedure: CORONARY ARTERY BYPASS GRAFTING (CABG), times one, using left internal mammary artery. Off pump;  Surgeon: [name] MD;  [facility]  Service: Open Heart Surgery;  Laterality: N/A;   CORONARY STENT PLACEMENT  02/10/2016   STENT XIENCE ALPINE RX 3.25X15 drug eluting stent was successfully placed   LEFT HEART CATH AND CORONARY ANGIOGRAPHY N/A 03/23/2017   Procedure: LEFT HEART CATH AND CORONARY ANGIOGRAPHY;  Surgeon: [name], MD;  [place];  Service: Cardiovascular;  Laterality: N/A;   LEFT HEART CATH AND CORS/GRAFTS ANGIOGRAPHY N/A 12/01/2021   Procedure: LEFT HEART CATH AND CORS/GRAFTS ANGIOGRAPHY;  Surgeon: VJettie Booze MD;  [facility];  Service: Cardiovascular;  Laterality: N/A;   SALIVARY GLAND SURGERY Left    benign   TEE WITHOUT CARDIOVERSION N/A 04/04/2017   Procedure: TRANSESOPHAGEAL ECHOCARDIOGRAM (TEE);  Surgeon: [name] MD;  [facility]  Service: Open Heart Surgery;  Laterality: N/A;     Current Outpatient Medications  Medication Sig Dispense Refill   acetaminophen (TYLENOL) 500 MG tablet Take 2 tablets (1,000 mg total) by mouth every 8 (eight)  hours as needed. 30 tablet 0   aspirin EC 81 MG tablet Take 81 mg by mouth daily.     Continuous Blood Gluc Receiver (FREESTYLE LIBRE 2 READER) DEVI 1 each by Does not apply route daily. 1 each 0   Continuous Blood Gluc Receiver (FREESTYLE LIBRE READER) DEVI Use Libre 3 as instructed to check blood sugar 1 each 0   Continuous Blood Gluc Sensor (FREESTYLE LIBRE 2 SENSOR) MISC APPLY A NEW SENSOR EVERY 14 DAYS. 6 each 3   Continuous Blood Gluc Sensor (FREESTYLE LIBRE 3 SENSOR) MISC Use as instructed to check blood sugar. 6 each 3   EASY COMFORT PEN NEEDLES 31G X 5 MM MISC USE AS DIRECTED FOUR TO  SIX TIMES DAILY 400 each 3   Glucagon 3 MG/DOSE POWD Place 3 mg into the nose once as needed for up to 1 dose. 1 each 11   Insulin Lispro-aabc (LYUMJEV KWIKPEN) 100 UNIT/ML KwikPen INJECT UP TO 30 UNITS UNDER THE SKIN DAILY AS DIRECTED (Patient taking differently: Inject 0-30 Units into the skin daily as needed (low blood glucose).) 30 mL 3   Insulin Pen Needle 32G X 4 MM MISC Use 4-6x a day 400 each 3   losartan (COZAAR) 25 MG tablet TAKE ONE TABLET BY MOUTH DAILY. 90 tablet 3   metoprolol succinate (TOPROL-XL) 50 MG 24 hr tablet Take 1 tablet (50 mg total) by mouth daily. Please schedule appointment for further refills 30 tablet 1   Multiple Vitamins-Minerals (MULTIVITAMIN WITH MINERALS) tablet Take 1 tablet by mouth daily.     rosuvastatin (CRESTOR) 40 MG tablet TAKE ONE TABLET BY MOUTH ONCE DAILY 30 tablet 0   SEMGLEE, YFGN, 100 UNIT/ML Pen Inject 16 Units into the skin daily.     tadalafil (CIALIS) 5 MG tablet Take 1 tablet (5 mg total) by mouth daily as needed for erectile dysfunction. 10 tablet 0   TOUJEO SOLOSTAR 300 UNIT/ML Solostar Pen INJECT 18-20 UNITS INTO THE SKIN AT BEDTIME. (BOX MUST LAST 66 DAYS PER INSURANCE) 4.5 mL 2   No current facility-administered medications for this visit.    Allergies:   Zetia [ezetimibe]    ROS:  Please see the history of present illness.   Otherwise, review of systems are positive for ED.   All other systems are reviewed and negative.    PHYSICAL EXAM: VS:  BP (!) 158/68   Pulse 74   Ht '5\' 9"'$  (1.753 m)   Wt 157 lb 6.4 oz (71.4 kg)   SpO2 98%   BMI 23.24 kg/m  , BMI Body mass index is 23.24 kg/m.  GENERAL:  Well appearing NECK:  No jugular venous distention, waveform within normal limits, carotid upstroke brisk and symmetric, no bruits, no thyromegaly LUNGS:  Clear to auscultation bilaterally CHEST:  Well healed sternotomy scar. HEART:  PMI not displaced or sustained,S1 and S2 within normal limits, no S3, no S4, no clicks, no rubs, no  murmurs ABD:  Flat, positive bowel sounds normal in frequency in pitch, no bruits, no rebound, no guarding, no midline pulsatile mass, no hepatomegaly, no splenomegaly EXT:  2 plus pulses throughout, no edema, no cyanosis no clubbing   EKG:  EKG is   ordered today. Sinus rhythm, rate 73, axis within normal limits, intervals within normal limits, no acute ST-T wave changes.   Cardiac cath  Diagnostic Dominance: Right   Recent Labs: 11/28/2021: BUN 13; Creatinine, Ser 1.09; Hemoglobin 12.0; Platelets 221; Potassium 4.5; Sodium 141  Lab Results  Component Value Date   HGBA1C 6.7 (A) 08/26/2021    Wt Readings from Last 3 Encounters:  12/16/21 157 lb 6.4 oz (71.4 kg)  12/01/21 155 lb (70.3 kg)  11/28/21 158 lb (71.7 kg)     Other studies Reviewed: Additional studies/ records that were reviewed today include:  None  Review of the above records demonstrates: NA  ASSESSMENT AND PLAN:  CABG:    He had a patent vessels as above.  No change in therapy.  He will continue with risk reduction.  DYSLIPIDEMIA:    LDL was 78 and I would like this to be in the 50s but he did not tolerate Zetia.  He has changed his diet and we will repeat his lipids in a few months.   HTN:    The blood pressure is mildly elevated but this is unusual.  He knows the range that I would like is 830-940 systolic  DM TYPE I:  H6K was 6.7 which is improved from previous.  This is followed by Dr. Quintin Alto.     Current medicines are reviewed at length with the patient today.  The patient does not have concerns regarding medicines.  The following changes have been made: None  Labs/ tests ordered today include:    None  No orders of the defined types were placed in this encounter.   Disposition:   Follow up with me 12 months   Signed, [name], MD  12/16/2021 12:17 PM    [place]

## 2021-12-16 ENCOUNTER — Ambulatory Visit: Payer: Commercial Managed Care - PPO | Attending: Cardiology | Admitting: Cardiology

## 2021-12-16 ENCOUNTER — Encounter: Payer: Self-pay | Admitting: Cardiology

## 2021-12-16 VITALS — BP 158/68 | HR 74 | Ht 69.0 in | Wt 157.4 lb

## 2021-12-16 DIAGNOSIS — Z951 Presence of aortocoronary bypass graft: Secondary | ICD-10-CM | POA: Diagnosis not present

## 2021-12-16 DIAGNOSIS — E785 Hyperlipidemia, unspecified: Secondary | ICD-10-CM

## 2021-12-16 DIAGNOSIS — I10 Essential (primary) hypertension: Secondary | ICD-10-CM

## 2021-12-16 NOTE — Patient Instructions (Addendum)
Medication Instructions:  Your physician recommends that you continue on your current medications as directed. Please refer to the Current Medication list given to you today.  *If you need a refill on your cardiac medications before your next appointment, please call your pharmacy*   Lab Work: When fasting: Lipids If you have labs (blood work) drawn today and your tests are completely normal, you will receive your results only by: [place] (if you have MyChart) OR A paper copy in the mail If you have any lab test that is abnormal or we need to change your treatment, we will call you to review the results.   Testing/Procedures: None   Follow-Up: At [facility], you and your health needs are our priority.  As part of our continuing mission to provide you with exceptional heart care, we have created designated Provider Care Teams.  These Care Teams include your primary Cardiologist (physician) and Advanced Practice Providers (APPs -  Physician Assistants and Nurse Practitioners) who all work together to provide you with the care you need, when you need it.  We recommend signing up for the patient portal called "MyChart".  Sign up information is provided on this After Visit Summary.  MyChart is used to connect with patients for Virtual Visits (Telemedicine).  Patients are able to view lab/test results, encounter notes, upcoming appointments, etc.  Non-urgent messages can be sent to your provider as well.   To learn more about what you can do with MyChart, go to [email or web address].    Your next appointment:   1 year(s)  The format for your next appointment:   In Person  Provider:   [name], MD     Other Instructions   Important Information About Sugar

## 2021-12-19 ENCOUNTER — Other Ambulatory Visit: Payer: Self-pay | Admitting: Cardiology

## 2021-12-22 ENCOUNTER — Encounter: Payer: Self-pay | Admitting: Cardiology

## 2021-12-30 ENCOUNTER — Encounter: Payer: Self-pay | Admitting: Internal Medicine

## 2021-12-30 ENCOUNTER — Ambulatory Visit (INDEPENDENT_AMBULATORY_CARE_PROVIDER_SITE_OTHER): Payer: Commercial Managed Care - PPO | Admitting: Internal Medicine

## 2021-12-30 VITALS — BP 140/86 | HR 70 | Ht 69.0 in | Wt 159.2 lb

## 2021-12-30 DIAGNOSIS — E1059 Type 1 diabetes mellitus with other circulatory complications: Secondary | ICD-10-CM

## 2021-12-30 DIAGNOSIS — E7800 Pure hypercholesterolemia, unspecified: Secondary | ICD-10-CM

## 2021-12-30 LAB — POCT GLYCOSYLATED HEMOGLOBIN (HGB A1C): Hemoglobin A1C: 7.2 % — AB (ref 4.0–5.6)

## 2021-12-30 NOTE — Patient Instructions (Addendum)
Please use: - Semglee 16 units in a.m. - Lyumjev ICR:  - 1:8, except 1:10 for lunch - 1:6 with starches: rice, pasta, potatoes, etc. Target: 120 Sensitivity factor: 1:40, but 60 in the evening and overnight (may need to use this throughout the day)  Count 50% of the proteins in a meal as carbs for low-carb meal.  Please do the following approximately before every meal: - count carbs (C) - check sugars (S) - inject insulin (I)  Please return in 4 months.

## 2021-12-30 NOTE — Progress Notes (Signed)
Patient ID: Kelly Hardy, male   DOB: [birth year], 61 y.o.   MRN: [id number]   HPI: Kelly Hardy is a 61 y.o.-year-old male, initially referred by his cardiologist, Dr. Percival Hardy, returning for follow-up for DM type I, initially dx in 1993 as DM type II, insulin-dependent since 1994, uncontrolled, with complications (CAD - s/p DES 2017, CABG x1 2019; PN). He saw Dr. Forde Dandy until few years ago.  Last visit with me 4 months ago.  Interim history: No increased urination, blurry vision, nausea, chest pain.  He had intermittent chest pain recently and saw Dr. Percival Hardy.  A cardiac cath was clear.  He is planning to retire in 01/2022.  Reviewed HbA1c levels: Lab Results  Component Value Date   HGBA1C 6.7 (A) 08/26/2021   HGBA1C 7.4 (A) 04/28/2021   HGBA1C 7.9 (A) 12/23/2020   HGBA1C 7.4 (A) 07/09/2020   HGBA1C 7.7 (A) 03/09/2020   HGBA1C 7.5 (A) 10/03/2019   HGBA1C 7.8 (A) 07/18/2019   HGBA1C 8.1 (A) 04/14/2019   HGBA1C 8.5 (H) 04/02/2017  10/11/2018: HbA1c 8.3% 10/19/2017: HbA1c 8.5%  Pt was on a regimen of: - Humalog 75/25 15 units 2x a day before meals - Humalog 2-8 units 3x a day, before meals - per sliding scale: ISF 50, target 120, ICR ~1:20  Currently on: - Toujeo 20 >> 18 >> 16-18 >> Semglee 18 >> 16 units in a.m. - Humalog >> Lyumjev: ICR:  - 1:8 - 1:6  with starches: rice, pasta, potatoes, etc. Target: 120 - actually using 90-100 Sensitivity factor: 1:40  He checks his sugars more than 4 times a day with his freestyle libre CGM:  Previously:   Previously:   Lowest sugar was 39 ... >> 50s >> 52; he has hypoglycemia awareness in the 80s. Highest sugar was  385 >> 200s >> 200s >> 300s  Glucometer: One Touch Verio  Pt's meals are: - Breakfast: egg sandwich or whole wheat bread + mayo, coffee - Lunch: microwaveable lunch, chicken sandwich, PB crackers or something else light - Dinner: sandwich with mayo + lean meat: [place] or ham, no veggies usually - Snacks:  crackers, almonds at night He is walking for exercise.  She tried more intense exercise but had severe muscle aches.  -No CKD, last BUN/creatinine:  Lab Results  Component Value Date   BUN 13 11/28/2021   BUN 17 10/03/2019   CREATININE 1.09 11/28/2021   CREATININE 0.85 10/03/2019  10/11/2018: 13/0.94, GFR 89, glucose 271, ACR 8 10/19/2017: 16/0.92, glucose 302, ACR <3.7 On Cozaar 25.  -+ HL; last lipid panel: Lab Results  Component Value Date   CHOL 133 11/28/2021   HDL 41 11/28/2021   LDLCALC 78 11/28/2021   TRIG 68 11/28/2021   CHOLHDL 3.2 11/28/2021  10/11/2018: 106/41/36/62 10/19/2017: 112/46/41/62 On Crestor 40.  - last eye exam was 02/04/2021: No DR.   -He has numbness and tingling in his toes.  He also has occasional leg cramps, plantar fasciitis.  Last foot exam 08/2021.  He is on ASA 81.  He also has a history of HTN.  Pt has FH of DM in daughter - type 1 - dx. At 51 y/o. Patient's wife had breast cancer.  ROS: + See HPI  I reviewed pt's medications, allergies, PMH, social hx, family hx, and changes were documented in the history of present illness. Otherwise, unchanged from my initial visit note.  Past Medical History:  Diagnosis Date   Coronary artery disease    Diabetes mellitus without  complication ([place])    Type I   GERD (gastroesophageal reflux disease)    Hyperlipidemia    Hypertension    Palpitations    eval 2014   Squamous cell carcinoma of skin 05/26/1999   left sideburn tx with bx   Past Surgical History:  Procedure Laterality Date   CARDIAC CATHETERIZATION N/A 02/10/2016   Procedure: Left Heart Cath and Coronary Angiography;  Surgeon: [name], MD;  Location: [facility];  Service: Cardiovascular;  Laterality: N/A;   CARDIAC CATHETERIZATION N/A 02/10/2016   Procedure: Coronary Stent Intervention;  Surgeon: [name], MD;  Location: [facility];  Service: Cardiovascular;  Laterality: N/A;   CORONARY ARTERY BYPASS GRAFT  N/A 04/04/2017   Procedure: CORONARY ARTERY BYPASS GRAFTING (CABG), times one, using left internal mammary artery. Off pump;  Surgeon: Melrose Nakayama, MD;  Location: [facility];  Service: Open Heart Surgery;  Laterality: N/A;   CORONARY STENT PLACEMENT  02/10/2016   STENT XIENCE ALPINE RX 3.25X15 drug eluting stent was successfully placed   LEFT HEART CATH AND CORONARY ANGIOGRAPHY N/A 03/23/2017   Procedure: LEFT HEART CATH AND CORONARY ANGIOGRAPHY;  Surgeon: Martinique, Peter M, MD;  Location: [facility];  Service: Cardiovascular;  Laterality: N/A;   LEFT HEART CATH AND CORS/GRAFTS ANGIOGRAPHY N/A 12/01/2021   Procedure: LEFT HEART CATH AND CORS/GRAFTS ANGIOGRAPHY;  Surgeon: Jettie Booze, MD;  Location: [facility];  Service: Cardiovascular;  Laterality: N/A;   SALIVARY GLAND SURGERY Left    benign   TEE WITHOUT CARDIOVERSION N/A 04/04/2017   Procedure: TRANSESOPHAGEAL ECHOCARDIOGRAM (TEE);  Surgeon: Melrose Nakayama, MD;  Location: [facility];  Service: Open Heart Surgery;  Laterality: N/A;   Social History   Socioeconomic History   Marital status: Married    Spouse name: Not on file   Number of children: 2   Years of education: Not on file   Highest education level: Not on file  Occupational History   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 30 tablet 0   aspirin EC 81 MG tablet Take 81 mg by mouth daily.     Continuous Blood Gluc  Receiver (FREESTYLE LIBRE 2 READER) DEVI 1 each by Does not apply route daily. 1 each 0   Continuous Blood Gluc Receiver (FREESTYLE LIBRE READER) DEVI Use Libre 3 as instructed to check blood sugar 1 each 0   Continuous Blood Gluc Sensor (FREESTYLE LIBRE 2 SENSOR) MISC APPLY A NEW SENSOR EVERY 14 DAYS. 6 each 3   Continuous Blood Gluc Sensor (FREESTYLE LIBRE 3 SENSOR) MISC Use as instructed to check blood sugar. 6 each 3   EASY COMFORT PEN NEEDLES 31G X 5 MM MISC USE AS DIRECTED FOUR TO SIX TIMES DAILY 400 each 3   Glucagon 3 MG/DOSE POWD Place 3 mg  into the nose once as needed for up to 1 dose. 1 each 11   Insulin Lispro-aabc (LYUMJEV KWIKPEN) 100 UNIT/ML KwikPen INJECT UP TO 30 UNITS UNDER THE SKIN DAILY AS DIRECTED (Patient taking differently: Inject 0-30 Units into the skin daily as needed (low blood glucose).) 30 mL 3   Insulin Pen Needle 32G X 4 MM MISC Use 4-6x a day 400 each 3   losartan (COZAAR) 25 MG tablet TAKE ONE TABLET BY MOUTH DAILY. 90 tablet 3   metoprolol succinate (TOPROL-XL) 50 MG 24 hr tablet Take 1 tablet (50 mg total) by mouth daily. 30 tablet 10   Multiple Vitamins-Minerals (MULTIVITAMIN WITH MINERALS) tablet Take 1 tablet by mouth daily.     rosuvastatin (CRESTOR) 40 MG tablet TAKE ONE TABLET BY MOUTH ONCE DAILY 30 tablet 0   SEMGLEE, YFGN, 100 UNIT/ML Pen Inject 16 Units into the skin daily.     tadalafil (CIALIS) 5 MG tablet Take 1 tablet (5 mg total) by mouth daily as needed for erectile dysfunction. 10 tablet 0   TOUJEO SOLOSTAR 300 UNIT/ML Solostar Pen INJECT 18-20 UNITS INTO THE SKIN AT BEDTIME. (BOX MUST LAST 66 DAYS PER INSURANCE) 4.5 mL 2   No current facility-administered medications on file prior to visit.   Allergies  Allergen Reactions   Zetia [Ezetimibe] Rash   Family History  Problem Relation Age of Onset   Coronary artery disease Mother 52       Died age 18, CABG   CAD Father 53       Died age 10, CABG   CAD Brother 12       Stents   Heart  failure Maternal Grandmother     PE: There were no vitals taken for this visit. Wt Readings from Last 3 Encounters:  12/16/21 157 lb 6.4 oz (71.4 kg)  12/01/21 155 lb (70.3 kg)  11/28/21 158 lb (71.7 kg)   Constitutional: normal weight, in NAD Eyes: EOMI, no exophthalmos ENT: no thyromegaly, no cervical lymphadenopathy Cardiovascular: RRR, No MRG, + wears compression hoses for leg swelling  Respiratory: CTA B Musculoskeletal: no deformities Skin: no rashes Neurological: no tremor with outstretched hands   ASSESSMENT: 1. DM1, insulin-dependent, uncontrolled, with complications - CAD - s/p DES 2017, CABG x1 04/2017 - PN  We diagnosed type 1 diabetes based on the low C-peptide suggesting insulin deficiency: Component     Latest Ref Rng & Units 12/31/2018  ZNT8 Antibodies     U/mL <15  Glutamic Acid Decarb Ab     <5 IU/mL <5  C-Peptide     0.80 - 3.85 ng/mL <0.10 (L)  Glucose, Plasma     65 - 99 mg/dL 94  POC Glucose     70 - 99 mg/dl 132 (A)  Islet Cell Ab     Neg:<1:1 Negative   2. HL  PLAN:  1. Patient with longstanding, uncontrolled, insulin-dependent type 1 diabetes, diagnosis in 12/2018 when he was found to have low insulin production.  His sugars are quite fluctuating, consistent with insulin deficiency.  He did have improvement in the blood sugars after switching from Humalog to Lyumjev and adjusting his insulin to carb ratios. -At last visit, HbA1c was the best he had in a long time, at 6.7%.  Sugars were significantly improved, most in the target range.  We did decrease his Semglee slightly as he had some sugars during the day under 70s, but otherwise we continued the rest of the regimen. CGM interpretation: -At today's  visit, we reviewed his CGM downloads: It appears that 59% of values are in target range (goal >70%), while 8% are higher than 180 (goal <25%), and 3% are lower than 70 (goal <4%).  The calculated average blood sugar is 167.  The projected HbA1c for  the next 3 months ([facility]) is 7.3%. -Reviewing the CGM trends, sugars are decreasing abruptly overnight and then increasing after every meal, particularly after breakfast and dinner.  Upon questioning, he is not able to take Lyumjev at the start of the meal and most times he is taking it after he already started to eat or even when the sugars start to increase after meals.  Also, when he is correcting the high blood sugars he is using a lower target than recommended: 90-100.  This may be adequate throughout the day, but I feel it is too strict overnight.  He is currently dropping his blood sugars quite abruptly overnight.  I advised him to increase the target to 120 but I also recommended a change in sensitivity factor in the evening and overnight.  I did advise him that after he retired, if she still gets too much insulin for correction, to change the sensitivity factor to 60 throughout the day. -He plans to retire next month.  He will be less busy and he feels that he will be better able to inject the Lyumjev before meals at that time.  We will not change the bolusing parameters for now.   - I suggested to:  Patient Instructions  Please use: - Semglee 16 units in a.m. - Lyumjev ICR:  - 1:8, except 1:10 for lunch - 1:6 with starches: rice, pasta, potatoes, etc. Target: 120 Sensitivity factor: 1:40, but 60 in the evening and overnight (may need to use this throughout the day)  Count 50% of the proteins in a meal as carbs for low-carb meal.  Please do the following approximately before every meal: - count carbs (C) - check sugars (S) - inject insulin (I)  Please return in 4 months.   - we checked his HbA1c: 7.2% (higher) - advised to check sugars at different times of the day - 4x a day, rotating check times - advised for yearly eye exams >> he is UTD - return to clinic in 4 months  2. HL -Reviewed latest lipid panel from last month: LDL above our target of less than 55 due to cardiovascular  disease: Lab Results  Component Value Date   CHOL 133 11/28/2021   HDL 41 11/28/2021   LDLCALC 78 11/28/2021   TRIG 68 11/28/2021   CHOLHDL 3.2 11/28/2021  -He continues on Crestor 40 mg daily without side effects  Philemon Kingdom, MD PhD [facility] Endocrinology

## 2022-01-19 ENCOUNTER — Other Ambulatory Visit: Payer: Self-pay | Admitting: Cardiology

## 2022-03-14 ENCOUNTER — Encounter: Payer: Self-pay | Admitting: *Deleted

## 2022-03-20 ENCOUNTER — Other Ambulatory Visit: Payer: Self-pay | Admitting: Cardiology

## 2022-04-17 ENCOUNTER — Encounter: Payer: Self-pay | Admitting: *Deleted

## 2022-04-27 ENCOUNTER — Other Ambulatory Visit: Payer: Self-pay | Admitting: Cardiology

## 2022-05-03 ENCOUNTER — Encounter: Payer: Self-pay | Admitting: Internal Medicine

## 2022-05-03 ENCOUNTER — Ambulatory Visit (INDEPENDENT_AMBULATORY_CARE_PROVIDER_SITE_OTHER): Payer: Commercial Managed Care - PPO | Admitting: Internal Medicine

## 2022-05-03 VITALS — BP 110/64 | HR 70 | Wt 162.4 lb

## 2022-05-03 DIAGNOSIS — E1059 Type 1 diabetes mellitus with other circulatory complications: Secondary | ICD-10-CM

## 2022-05-03 DIAGNOSIS — E7800 Pure hypercholesterolemia, unspecified: Secondary | ICD-10-CM

## 2022-05-03 LAB — POCT GLYCOSYLATED HEMOGLOBIN (HGB A1C): Hemoglobin A1C: 7.2 % — AB (ref 4.0–5.6)

## 2022-05-03 MED ORDER — SEMGLEE (YFGN) 100 UNIT/ML ~~LOC~~ SOPN
16.0000 [IU] | PEN_INJECTOR | Freq: Every day | SUBCUTANEOUS | 3 refills | Status: DC
Start: 2022-05-03 — End: 2022-05-12

## 2022-05-03 MED ORDER — GLUCAGON 3 MG/DOSE NA POWD: 3.0000 mg | Freq: Once | NASAL | 11 refills | Status: AC | PRN

## 2022-05-03 MED ORDER — INSULIN PEN NEEDLE 32G X 4 MM MISC
3 refills | Status: DC
Start: 2022-05-03 — End: 2022-05-15

## 2022-05-03 NOTE — Progress Notes (Signed)
Patient ID: Kelly Hardy, male   DOB: June 08, 1960, 62 y.o.   MRN: [id number]   HPI: Kelly Hardy is a 62 y.o.-year-old male, initially referred by his cardiologist, Dr. Percival Spanish, returning for follow-up for DM type I, initially dx in 1993 as DM type II, insulin-dependent since 1994, uncontrolled, with complications (CAD - s/p DES 2017, CABG x1 2019; PN). He saw Dr. Forde Dandy until few years ago.  Last visit with me 4 months ago.  Interim history: No increased urination, blurry vision, nausea, chest pain.  He had intermittent chest pain recently and saw Dr. Percival Spanish.  A cardiac cath was clear before last visit. He started to exercise more consistently since last visit.  Reviewed HbA1c levels: Lab Results  Component Value Date   HGBA1C 7.2 (A) 12/30/2021   HGBA1C 6.7 (A) 08/26/2021   HGBA1C 7.4 (A) 04/28/2021   HGBA1C 7.9 (A) 12/23/2020   HGBA1C 7.4 (A) 07/09/2020   HGBA1C 7.7 (A) 03/09/2020   HGBA1C 7.5 (A) 10/03/2019   HGBA1C 7.8 (A) 07/18/2019   HGBA1C 8.1 (A) 04/14/2019   HGBA1C 8.5 (H) 04/02/2017  10/11/2018: HbA1c 8.3% 10/19/2017: HbA1c 8.5%  Pt was on a regimen of: - Humalog 75/25 15 units 2x a day before meals - Humalog 2-8 units 3x a day, before meals - per sliding scale: ISF 50, target 120, ICR ~1:20  Currently on: - Toujeo 20 >> 18 >> 16-18 >> Semglee 18 >> 16 units in a.m. - Humalog >> Lyumjev: ICR:  - 1:8 - 1:6  with starches: rice, pasta, potatoes, etc. Target: 120 >> actually using 90-100 >> 120 Sensitivity factor: 1:40, but 60 in the evening and overnight (may need to use this throughout the day)  He was previously on Toujeo.  He checks his sugars more than 4 times a day with his freestyle libre CGM:  Previously:  Previously:  Lowest sugar was 39 ... >> 50s >> 52 >> 59; he has hypoglycemia awareness in the 80s. Highest sugar was  385 >> 200s >> 200s >> 300s >> 400.  Glucometer: One Touch Verio  Pt's meals are: - Breakfast: egg sandwich or whole wheat  bread + mayo, coffee - Lunch: microwaveable lunch, chicken sandwich, PB crackers or something else light - Dinner: sandwich with mayo + lean meat: [place] or ham, no veggies usually - Snacks: crackers, almonds at night He is walking for exercise.  She tried more intense exercise but had severe muscle aches.  -No CKD, last BUN/creatinine:  Lab Results  Component Value Date   BUN 13 11/28/2021   BUN 17 10/03/2019   CREATININE 1.09 11/28/2021   CREATININE 0.85 10/03/2019  10/11/2018: 13/0.94, GFR 89, glucose 271, ACR 8 10/19/2017: 16/0.92, glucose 302, ACR <3.7 On Cozaar 25.  -+ HL; last lipid panel: Lab Results  Component Value Date   CHOL 133 11/28/2021   HDL 41 11/28/2021   LDLCALC 78 11/28/2021   TRIG 68 11/28/2021   CHOLHDL 3.2 11/28/2021  10/11/2018: 106/41/36/62 10/19/2017: 112/46/41/62 On Crestor 40.  - last eye exam was 02/2022: No DR reportedly - [facility].  -He has numbness and tingling in his toes.  He also has occasional leg cramps, plantar fasciitis.  Last foot exam 08/2021.  He is on ASA 81.  He also has a history of HTN.  Pt has FH of DM in daughter - type 1 - dx. At 37 y/o. Patient's wife had breast cancer.  ROS: + See HPI  I reviewed pt's medications, allergies, PMH, social  hx, family hx, and changes were documented in the history of present illness. Otherwise, unchanged from my initial visit note.  Past Medical History:  Diagnosis Date   Coronary artery disease    Diabetes mellitus without complication (Organ)    Type I   GERD (gastroesophageal reflux disease)    Hyperlipidemia    Hypertension    Palpitations    eval 2014   Squamous cell carcinoma of skin 05/26/1999   left sideburn tx with bx   Past Surgical History:  Procedure Laterality Date   CARDIAC CATHETERIZATION N/A 02/10/2016   Procedure: Left Heart Cath and Coronary Angiography;  Surgeon: [name], MD;  Location: [facility];  Service: Cardiovascular;  Laterality: N/A;    CARDIAC CATHETERIZATION N/A 02/10/2016   Procedure: Coronary Stent Intervention;  Surgeon: [name], MD;  Location: [place];  Service: Cardiovascular;  Laterality: N/A;   CORONARY ARTERY BYPASS GRAFT N/A 04/04/2017   Procedure: CORONARY ARTERY BYPASS GRAFTING (CABG), times one, using left internal mammary artery. Off pump;  Surgeon: Melrose Nakayama, MD;  Location: [place] [facility];  Service: Open Heart Surgery;  Laterality: N/A;   CORONARY STENT PLACEMENT  02/10/2016   STENT XIENCE ALPINE RX 3.25X15 drug eluting stent was successfully placed   LEFT HEART CATH AND CORONARY ANGIOGRAPHY N/A 03/23/2017   Procedure: LEFT HEART CATH AND CORONARY ANGIOGRAPHY;  Surgeon: Martinique, Peter M, MD;  Location: [place] [facility];  Service: Cardiovascular;  Laterality: N/A;   LEFT HEART CATH AND CORS/GRAFTS ANGIOGRAPHY N/A 12/01/2021   Procedure: LEFT HEART CATH AND CORS/GRAFTS ANGIOGRAPHY;  Surgeon: Jettie Booze, MD;  Location: [facility];  Service: Cardiovascular;  Laterality: N/A;   SALIVARY GLAND SURGERY Left    benign   TEE WITHOUT CARDIOVERSION N/A 04/04/2017   Procedure: TRANSESOPHAGEAL ECHOCARDIOGRAM (TEE);  Surgeon: Melrose Nakayama, MD;  Location: [facility];  Service: Open Heart Surgery;  Laterality: N/A;   Social History   Socioeconomic History   Marital status: Married    Spouse name: Not on file   Number of children: 2   Years of education: Not on file   Highest education level: Not on file  Occupational History   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 30 tablet 0   aspirin EC 81 MG tablet Take 81 mg by mouth daily.     Continuous Blood Gluc Receiver (FREESTYLE LIBRE 2 READER) DEVI 1 each by Does not apply route daily. 1 each 0   Continuous Blood Gluc Receiver (FREESTYLE LIBRE READER) DEVI Use Libre 3 as instructed to check blood sugar 1 each 0   Continuous Blood Gluc Sensor (FREESTYLE LIBRE 2 SENSOR) MISC APPLY A NEW SENSOR EVERY 14 DAYS. 6 each 3   Continuous Blood Gluc Sensor (FREESTYLE LIBRE 3 SENSOR) MISC Use as instructed  to check blood sugar. 6 each 3   EASY COMFORT PEN NEEDLES 31G X 5 MM MISC USE AS DIRECTED FOUR TO SIX TIMES DAILY 400 each 3   Glucagon 3 MG/DOSE POWD Place 3 mg into the nose once as needed for up to 1 dose. 1 each 11   Insulin Lispro-aabc (LYUMJEV KWIKPEN) 100 UNIT/ML KwikPen INJECT UP TO 30 UNITS UNDER THE SKIN DAILY AS DIRECTED (Patient taking differently: Inject 0-30 Units into the skin daily as needed (low blood glucose).) 30 mL 3   Insulin Pen Needle 32G X 4 MM MISC Use 4-6x a day 400 each 3   losartan (COZAAR) 25 MG tablet TAKE ONE TABLET BY MOUTH DAILY 90 tablet 3   metoprolol succinate (TOPROL-XL) 50 MG 24 hr tablet Take 1 tablet (50 mg total) by mouth daily. 30 tablet 10   Multiple Vitamins-Minerals (MULTIVITAMIN WITH MINERALS) tablet Take 1 tablet by mouth daily.     rosuvastatin (CRESTOR) 40 MG tablet TAKE ONE TABLET BY MOUTH ONCE DAILY 90 tablet 2   SEMGLEE, YFGN, 100 UNIT/ML Pen Inject 16 Units into the skin daily.     tadalafil (CIALIS) 5 MG tablet Take 1 tablet (5 mg total) by mouth daily as needed for erectile dysfunction. 10 tablet 0   TOUJEO SOLOSTAR 300 UNIT/ML Solostar Pen INJECT 18-20 UNITS INTO THE SKIN AT BEDTIME. (BOX MUST LAST 66 DAYS PER INSURANCE) 4.5 mL 2   No current facility-administered medications on file prior to visit.   Allergies  Allergen Reactions    Zetia [Ezetimibe] Rash   Family History  Problem Relation Age of Onset   Coronary artery disease Mother 75       Died age 15, CABG   CAD Father 74       Died age 67, CABG   CAD Brother 99       Stents   Heart failure Maternal Grandmother     PE: BP 110/64 (BP Location: Left Arm, Patient Position: Sitting, Cuff Size: Normal)   Pulse 70   Wt 162 lb 6.4 oz (73.7 kg)   BMI 23.98 kg/m  Wt Readings from Last 3 Encounters:  05/03/22 162 lb 6.4 oz (73.7 kg)  12/30/21 159 lb 3.2 oz (72.2 kg)  12/16/21 157 lb 6.4 oz (71.4 kg)   Constitutional: normal weight, in NAD Eyes: EOMI, no exophthalmos ENT: no thyromegaly, no cervical lymphadenopathy Cardiovascular: RRR, No MRG, + wears compression hoses for leg swelling  Respiratory: CTA B Musculoskeletal: no deformities Skin: no rashes Neurological: no tremor with outstretched hands   ASSESSMENT: 1. DM1, insulin-dependent, uncontrolled, with complications - CAD - s/p DES 2017, CABG x1 04/2017 - PN  We diagnosed type 1 diabetes based on the low C-peptide suggesting insulin deficiency: Component     Latest Ref Rng & Units 12/31/2018  ZNT8 Antibodies     U/mL <15  Glutamic Acid Decarb Ab     <5 IU/mL <5  C-Peptide     0.80 - 3.85 ng/mL <0.10 (L)  Glucose, Plasma     65 - 99 mg/dL 94  POC Glucose     70 - 99 mg/dl 132 (A)  Islet Cell Ab     Neg:<1:1 Negative   2. HL  PLAN:  1. Patient with longstanding, uncontrolled, insulin-dependent type 1 diabetes, diagnosed in 12/2018 when he was found to have low insulin production.  His sugars are usually quite fluctuating, consistent with insulin deficiency.  He did have improvement in the blood sugars after switching  from Humalog to Lyumjev and adjusting his insulin to carb ratios. -At last visit, however, sugars were decreasing abruptly overnight, and then increasing after every meal, particularly after breakfast and dinner.  He was not  taking Lyumjev at the start of the meal but after  already starting eating and we discussed about the importance of taking this at the beginning of the meal.  We also adjusted his target and sensitivity factors. CGM interpretation: -At today's visit, we reviewed his CGM downloads: It appears that 64% of values are in target range (goal >70%), while 35% are higher than 180 (goal <25%), and 1% are lower than 70 (goal <4%).  The calculated average blood sugar is 162.  The projected HbA1c for the next 3 months (GMI) is 7.2%. -Reviewing the CGM trends, sugars still follow-up pattern of improving overnight with the majority of the blood sugars at goal after approximately 1 AM, but then increasing significantly after 8 AM.  He is drinking coffee around that time.  He mentions that he drinks approximately 3 coffees a day, but does not any sugary supplements.  We discussed that coffee itself can raise his blood sugars and I recommended stool bolus 2 to 3 units of insulin before the coffee.  He is exercising at the gym Monday Wednesday Friday and he notices that sometimes after exercise sugars are going up.  He does a combination of cardio and strength exercises.  He most frequently starts with strength exercises and then does cardio.  Total exercise time: Approximately 1 hour.  He does have fast sugars with him but only uses them as needed.  He occasionally goes to lunch with a friend with which he goes to gym and sugars are higher after this meal because it is difficult to estimate the carbs in the meal.  For now, I recommended to start bolusing for coffee and then try to start the exercise with either cardio or strength exercises depending on the blood sugars.  I did not recommend to change the rest of the boluses because I anticipate the sugars to improve throughout the day if we can improve the sugars after breakfast/coffee.  He has occasional low blood sugars possibly because of the injecting insulin too late.  I did not recommend changing his Semglee dose for  now. -I recommended a reference book for further fine-tuning of his regimen.   - I suggested to:  Patient Instructions  Please use: - Semglee 16 units in a.m. - Lyumjev ICR:  - 1:8, except 1:10 for lunch - 1:6 with starches: rice, pasta, potatoes, etc. Target: 120 Sensitivity factor: 1:40, but 60 in the evening and overnight Count 50% of the proteins in a meal as carbs for low-carb meal.  Bolus few units of Lyumjev - start with 2-3, before coffee.  Read: Idamae Schuller - "Think like a Pancreas"  Please do the following approximately before every meal: - count carbs (C) - check sugars (S) - inject insulin (I)  Please return in 4 months.   - we checked his HbA1c: 7.2% (stable) - advised to check sugars at different times of the day - 4x a day, rotating check times - advised for yearly eye exams >> he is UTD - return to clinic in 3-4 months  2. HL -Reviewed lipid panel from 11/2021: LDL above target, otherwise fractions at goal: Lab Results  Component Value Date   CHOL 133 11/28/2021   HDL 41 11/28/2021   LDLCALC 78 11/28/2021   TRIG 68  11/28/2021   CHOLHDL 3.2 11/28/2021  -He continues on Crestor 40 mg daily without side effects  Philemon Kingdom, MD PhD [facility]

## 2022-05-03 NOTE — Patient Instructions (Addendum)
Please use: - Semglee 16 units in a.m. - Lyumjev ICR:  - 1:8, except 1:10 for lunch - 1:6 with starches: rice, pasta, potatoes, etc. Target: 120 Sensitivity factor: 1:40, but 60 in the evening and overnight Count 50% of the proteins in a meal as carbs for low-carb meal.  Bolus few units of Lyumjev - start with 2-3, before coffee.  Read: Kelly Hardy - "Think like a Pancreas"  Please do the following approximately before every meal: - count carbs (C) - check sugars (S) - inject insulin (I)  Please return in 4 months.

## 2022-05-09 ENCOUNTER — Other Ambulatory Visit: Payer: Self-pay | Admitting: Internal Medicine

## 2022-05-09 DIAGNOSIS — E1059 Type 1 diabetes mellitus with other circulatory complications: Secondary | ICD-10-CM

## 2022-05-12 ENCOUNTER — Other Ambulatory Visit: Payer: Self-pay | Admitting: Internal Medicine

## 2022-05-12 DIAGNOSIS — E1059 Type 1 diabetes mellitus with other circulatory complications: Secondary | ICD-10-CM

## 2022-05-12 LAB — LIPID PANEL
Chol/HDL Ratio: 3.3 ratio (ref 0.0–5.0)
Cholesterol, Total: 141 mg/dL (ref 100–199)
HDL: 43 mg/dL (ref >39)
LDL Chol Calc (NIH): 86 mg/dL (ref 0–99)
Triglycerides: 59 mg/dL (ref 0–149)
VLDL Cholesterol Cal: 12 mg/dL (ref 5–40)

## 2022-05-12 MED ORDER — LYUMJEV KWIKPEN 100 UNIT/ML ~~LOC~~ SOPN: PEN_INJECTOR | SUBCUTANEOUS | 2 refills | Status: DC

## 2022-05-12 MED ORDER — INSULIN GLARGINE-YFGN 100 UNIT/ML ~~LOC~~ SOPN: PEN_INJECTOR | SUBCUTANEOUS | 2 refills | Status: DC

## 2022-05-15 ENCOUNTER — Other Ambulatory Visit: Payer: Self-pay | Admitting: Internal Medicine

## 2022-05-15 ENCOUNTER — Other Ambulatory Visit: Payer: Self-pay | Admitting: *Deleted

## 2022-05-15 DIAGNOSIS — E1059 Type 1 diabetes mellitus with other circulatory complications: Secondary | ICD-10-CM

## 2022-05-15 DIAGNOSIS — E785 Hyperlipidemia, unspecified: Secondary | ICD-10-CM

## 2022-05-15 MED ORDER — INSULIN PEN NEEDLE 32G X 4 MM MISC: 3 refills | Status: AC

## 2022-07-12 ENCOUNTER — Other Ambulatory Visit: Payer: Self-pay | Admitting: Internal Medicine

## 2022-07-29 ENCOUNTER — Other Ambulatory Visit: Payer: Self-pay | Admitting: Internal Medicine

## 2022-09-26 ENCOUNTER — Ambulatory Visit (INDEPENDENT_AMBULATORY_CARE_PROVIDER_SITE_OTHER): Payer: Commercial Managed Care - PPO | Admitting: Internal Medicine

## 2022-09-26 ENCOUNTER — Encounter: Payer: Self-pay | Admitting: Internal Medicine

## 2022-09-26 VITALS — BP 136/70 | HR 97 | Ht 69.0 in | Wt 163.2 lb

## 2022-09-26 DIAGNOSIS — E7800 Pure hypercholesterolemia, unspecified: Secondary | ICD-10-CM | POA: Diagnosis not present

## 2022-09-26 DIAGNOSIS — E1059 Type 1 diabetes mellitus with other circulatory complications: Secondary | ICD-10-CM | POA: Diagnosis not present

## 2022-09-26 DIAGNOSIS — Z794 Long term (current) use of insulin: Secondary | ICD-10-CM

## 2022-09-26 DIAGNOSIS — E119 Type 2 diabetes mellitus without complications: Secondary | ICD-10-CM

## 2022-09-26 LAB — POCT GLYCOSYLATED HEMOGLOBIN (HGB A1C): Hemoglobin A1C: 7.4 % — AB (ref 4.0–5.6)

## 2022-09-26 MED ORDER — LYUMJEV KWIKPEN 100 UNIT/ML ~~LOC~~ SOPN: PEN_INJECTOR | SUBCUTANEOUS | 3 refills | Status: DC

## 2022-09-26 MED ORDER — INSULIN GLARGINE-YFGN 100 UNIT/ML ~~LOC~~ SOPN: PEN_INJECTOR | SUBCUTANEOUS | 3 refills | Status: DC

## 2022-09-26 NOTE — Patient Instructions (Addendum)
Please use: - Semglee 16 units in a.m. - Lyumjev ICR:  - 1:8, except 1:10 for lunch - 1:6 with starches: rice, pasta, potatoes, etc. Target: 120 Sensitivity factor: 1:40, but 60 in the evening and overnight Count 50% of the proteins in a meal as carbs for low-carb meal. Bolus few units of Lyumjev - start with 2-3, before coffee.  Please do the following approximately before every meal: - count carbs (C) - check sugars (S) - inject insulin (I)  Please return in 4 months.

## 2022-09-26 NOTE — Progress Notes (Signed)
Patient ID: Kelly Hardy, male   DOB: 23-Aug-1960, 62 y.o.   MRN: [id number]   HPI: Kelly Hardy is a 62 y.o.-year-old male, initially referred by his cardiologist, Dr. Antoine Poche, returning for follow-up for DM type I, initially dx in 1993 as DM type II, insulin-dependent since 1994, uncontrolled, with complications (CAD - s/p DES 2017, CABG x1 2019; PN). He saw Dr. Evlyn Kanner until few years ago.  Last visit with me 5 months ago.  Interim history: No increased urination, blurry vision, nausea, chest pain.  He started to exercise more consistently before last visit. He continues to go to the gym 3x a week with a friend. He also has a gym at home.  Reviewed HbA1c levels: Lab Results  Component Value Date   HGBA1C 7.2 (A) 05/03/2022   HGBA1C 7.2 (A) 12/30/2021   HGBA1C 6.7 (A) 08/26/2021   HGBA1C 7.4 (A) 04/28/2021   HGBA1C 7.9 (A) 12/23/2020   HGBA1C 7.4 (A) 07/09/2020   HGBA1C 7.7 (A) 03/09/2020   HGBA1C 7.5 (A) 10/03/2019   HGBA1C 7.8 (A) 07/18/2019   HGBA1C 8.1 (A) 04/14/2019  10/11/2018: HbA1c 8.3% 10/19/2017: HbA1c 8.5%  Pt was on a regimen of: - Humalog 75/25 15 units 2x a day before meals - Humalog 2-8 units 3x a day, before meals - per sliding scale: ISF 50, target 120, ICR ~1:20  Currently on: - Semglee 18 >> 16 units in a.m. - Humalog >> Lyumjev: ICR:  - 1:8 - 1:6  with starches: rice, pasta, potatoes, etc. Target: 120 >> actually using 90-100 >> 120 Sensitivity factor: 1:40, but 60 in the evening and overnight  Bolus few units of Lyumjev - start with 2-3, before coffee. He was previously on Toujeo.  He checks his sugars more than 4 times a day with his freestyle libre CGM:  Previously:  Previously:  Lowest sugar was 39 ...>> 59 >> 54; he has hypoglycemia awareness in the 80s. Highest sugar was 300s >> 400 >> 300s.  Glucometer: One Touch Verio  -No CKD, last BUN/creatinine:  Lab Results  Component Value Date   BUN 13 11/28/2021   BUN 17 10/03/2019    CREATININE 1.09 11/28/2021   CREATININE 0.85 10/03/2019  10/11/2018: 13/0.94, GFR 89, glucose 271, ACR 8 10/19/2017: 16/0.92, glucose 302, ACR <3.7 On Cozaar 25.  -+ HL; last lipid panel: Lab Results  Component Value Date   CHOL 141 05/11/2022   HDL 43 05/11/2022   LDLCALC 86 05/11/2022   TRIG 59 05/11/2022   CHOLHDL 3.3 05/11/2022  10/11/2018: 106/41/36/62 10/19/2017: 112/46/41/62 On Crestor 40. He has an appt with Dr. Rennis Golden in the [facility] this summer.  - last eye exam was 02/2022: No DR reportedly - [facility].  -He has numbness and tingling in his toes.  He also has occasional leg cramps, plantar fasciitis.  Last foot exam 08/2021.  He is on ASA 81.  He also has a history of HTN. He had intermittent chest pain and saw Dr. Antoine Poche.  A cardiac cath was clear in 2023.  Pt has FH of DM in daughter - type 1 - dx. At 56 y/o. His wife had breast cancer.  ROS: + See HPI  I reviewed pt's medications, allergies, PMH, social hx, family hx, and changes were documented in the history of present illness. Otherwise, unchanged from my initial visit note.  Past Medical History:  Diagnosis Date   Coronary artery disease    Diabetes mellitus without complication (HCC)    Type  I   GERD (gastroesophageal reflux disease)    Hyperlipidemia    Hypertension    Palpitations    eval 2014   Squamous cell carcinoma of skin 05/26/1999   left sideburn tx with bx   Past Surgical History:  Procedure Laterality Date   CARDIAC CATHETERIZATION N/A 02/10/2016   Procedure: Left Heart Cath and Coronary Angiography;  Surgeon: Lennette Bihari, MD;  Location: [facility] INVASIVE CV LAB;  Service: Cardiovascular;  Laterality: N/A;   CARDIAC CATHETERIZATION N/A 02/10/2016   Procedure: Coronary Stent Intervention;  Surgeon: Lennette Bihari, MD;  Location: [facility] INVASIVE CV LAB;  Service: Cardiovascular;  Laterality: N/A;   CORONARY ARTERY BYPASS GRAFT N/A 04/04/2017   Procedure: CORONARY ARTERY BYPASS GRAFTING  (CABG), times one, using left internal mammary artery. Off pump;  Surgeon: Loreli Slot, MD;  Location: [facility] OR;  Service: Open Heart Surgery;  Laterality: N/A;   CORONARY STENT PLACEMENT  02/10/2016   STENT XIENCE ALPINE RX 3.25X15 drug eluting stent was successfully placed   LEFT HEART CATH AND CORONARY ANGIOGRAPHY N/A 03/23/2017   Procedure: LEFT HEART CATH AND CORONARY ANGIOGRAPHY;  Surgeon: Swaziland, Peter M, MD;  Location: [facility] INVASIVE CV LAB;  Service: Cardiovascular;  Laterality: N/A;   LEFT HEART CATH AND CORS/GRAFTS ANGIOGRAPHY N/A 12/01/2021   Procedure: LEFT HEART CATH AND CORS/GRAFTS ANGIOGRAPHY;  Surgeon: Corky Crafts, MD;  Location: [facility] INVASIVE CV LAB;  Service: Cardiovascular;  Laterality: N/A;   SALIVARY GLAND SURGERY Left    benign   TEE WITHOUT CARDIOVERSION N/A 04/04/2017   Procedure: TRANSESOPHAGEAL ECHOCARDIOGRAM (TEE);  Surgeon: Loreli Slot, MD;  Location: [facility] OR;  Service: Open Heart Surgery;  Laterality: N/A;   Social History   Socioeconomic History   Marital status: Married    Spouse name: Not on file   Number of children: 2   Years of education: Not on file   Highest education level: Not on file  Occupational History   Occupation: Production designer, 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 30 tablet 0   aspirin EC 81 MG tablet Take 81 mg by mouth daily.     Continuous Blood Gluc Receiver (FREESTYLE LIBRE 2 READER) DEVI 1 each by Does  not apply route daily. 1 each 0   Continuous Blood Gluc Receiver (FREESTYLE LIBRE READER) DEVI Use Libre 3 as instructed to check blood sugar 1 each 0   Continuous Blood Gluc Sensor (FREESTYLE LIBRE 2 SENSOR) MISC APPLY A NEW SENSOR EVERY 14 DAYS 6 each 3   Continuous Blood Gluc Sensor (FREESTYLE LIBRE 3 SENSOR) MISC Use as instructed to check blood sugar. 6 each 3   Glucagon 3 MG/DOSE POWD Place 3 mg into the nose once as needed for up to 1 dose. 1 each 11   insulin glargine-yfgn (SEMGLEE, YFGN,) 100 UNIT/ML Pen INJECT 16 TO 18 UNITS  SUBCUTANEOUSLY ONCE DAILY IN THE EVENING 15 mL 1   Insulin Lispro-aabc (LYUMJEV KWIKPEN) 100 UNIT/ML KwikPen INJECT UP TO 30 UNITS SUBCUTANEOUSLY DAILY AS DIRECTED 15 mL 2   Insulin Pen Needle 32G X 4 MM MISC Use 4-6x a day 400 each 3   losartan (COZAAR) 25 MG tablet TAKE ONE TABLET BY MOUTH DAILY 90 tablet 3   metoprolol succinate (TOPROL-XL) 50 MG 24 hr tablet Take 1 tablet (50 mg total) by mouth daily. 30 tablet 10   Multiple Vitamins-Minerals (MULTIVITAMIN WITH MINERALS) tablet Take 1 tablet by mouth daily.     rosuvastatin (CRESTOR) 40 MG tablet TAKE ONE TABLET BY MOUTH ONCE DAILY 90 tablet 2   tadalafil (CIALIS) 5 MG tablet Take 1 tablet (5 mg total) by mouth daily as needed for erectile dysfunction. 10 tablet 0   TOUJEO SOLOSTAR 300 UNIT/ML Solostar Pen INJECT 18-20 UNITS INTO THE SKIN AT BEDTIME. (BOX MUST LAST 66 DAYS PER INSURANCE) 4.5 mL 2   No current facility-administered medications on file prior to visit.   Allergies  Allergen Reactions   Zetia [Ezetimibe] Rash   Family History  Problem Relation Age of Onset   Coronary artery disease Mother 79       Died age 4, CABG   CAD Father 22       Died age 75, CABG   CAD Brother 25       Stents   Heart failure Maternal Grandmother     PE: BP 136/70   Pulse 97   Ht 5\' 9"  (1.753 m)   Wt 163 lb 3.2 oz (74 kg)   SpO2 99%   BMI 24.10 kg/m  Wt Readings from Last 3 Encounters:  09/26/22 163 lb  3.2 oz (74 kg)  05/03/22 162 lb 6.4 oz (73.7 kg)  12/30/21 159 lb 3.2 oz (72.2 kg)   Constitutional: normal weight, in NAD Eyes: EOMI, no exophthalmos ENT: no thyromegaly, no cervical lymphadenopathy Cardiovascular: tachycardia, RR, No MRG, + wears compression hoses for leg swelling  Respiratory: CTA B Musculoskeletal: no deformities Skin: no rashes Neurological: no tremor with outstretched hands Diabetic Foot Exam - Simple   Simple Foot Form Diabetic Foot exam was performed with the following findings: Yes 09/26/2022 11:27 AM  Visual Inspection No deformities, no ulcerations, no other skin breakdown bilaterally: Yes Sensation Testing Intact to touch and monofilament testing bilaterally: Yes Pulse Check Posterior Tibialis and Dorsalis pulse intact bilaterally: Yes Comments    ASSESSMENT: 1. DM1, insulin-dependent, uncontrolled, with complications - CAD - s/p DES 2017, CABG x1 04/2017 - PN  We diagnosed type 1 diabetes based on the low C-peptide suggesting insulin deficiency: Component     Latest Ref Rng & Units 12/31/2018  ZNT8 Antibodies     U/mL <15  Glutamic Acid Decarb Ab     <5 IU/mL <5  C-Peptide     0.80 - 3.85 ng/mL <0.10 (L)  Glucose, Plasma     65 - 99 mg/dL 94  POC Glucose     70 - 99 mg/dl 161 (A)  Islet Cell Ab     Neg:<1:1 Negative   2. HL  PLAN:  1. Patient with long tenting, uncontrolled, insulin-dependent type 1 diabetes, diagnosed in 12/2018, with fluctuating blood sugars consistent with insulin deficiency.  He did have improvement in the blood sugars after switching from Humalog to Lyumjev and adjusting his insulin to carb ratios.  Latest HbA1c was 7.2%, stable.  At last visit, sugars were improving overnight with the  majority of the blood sugars at goal after 1 AM, but then increasing significantly after 8 AM.  He was drinking coffee around that time.  I advised him to bolus insulin before coffee (2 to 3 units).  He also started to exercise more  consistently at the gym 3 times a week and he noticed that sometimes sugars were going up after exercise.  He was doing a combination of cardio and strength exercises, 1 hour sessions.  He did have sugars with him using them as needed.  We discussed about starting exercise with either cardio or strength exercises depending on the blood sugars.  I did not recommend to change the rest of the boluses because I anticipate that sugars to continue to improve throughout the day after improving the sugars after breakfast/coffee.  He had occasional low blood sugars possibly because of injecting insulin too late.  We did not change his Semglee dose at that time. CGM interpretation: -At today's visit, we reviewed his CGM downloads: It appears that 71% of values are in target range (goal >70%), while 26% are higher than 180 (goal <25%), and 3% are lower than 70 (goal <4%).  The calculated average blood sugar is 148.  The projected HbA1c for the next 3 months (GMI) is 6.9%. -Reviewing the CGM trends from 1 month ago, they appear to be the best he had in a long time.  There is still fluctuation in the blood sugars and occasional higher blood sugars after dinner, but overall the sugars are fluctuating within the more narrow range, with only occasional lows.  He mentions that in the last month he has had less lows and slightly more high blood sugars as he is relaxed his diet.  He is planning to improve this.  He continues to exercise consistently, which I feel helped with his blood sugars.  I did not suggest a change in regimen today.   - I refilled his insulins to [facility] pharmacy. - I suggested to:  Patient Instructions  Please use: - Semglee 16 units in a.m. - Lyumjev ICR:  - 1:8, except 1:10 for lunch - 1:6 with starches: rice, pasta, potatoes, etc. Target: 120 Sensitivity factor: 1:40, but 60 in the evening and overnight Count 50% of the proteins in a meal as carbs for low-carb meal. Bolus few units of Lyumjev  - start with 2-3, before coffee.  Please do the following approximately before every meal: - count carbs (C) - check sugars (S) - inject insulin (I)  Please return in 4 months.   - we checked his HbA1c: 7.4% (slightly higher) - advised to check sugars at different times of the day - 4x a day, rotating check times - advised for yearly eye exams >> he is UTD - return to clinic in 4 months  2. HL -Reviewed the latest lipid panel from 05/2022: We discussed that he has LDL is above our target of less than 55 due to cardiovascular disease: Lab Results  Component Value Date   CHOL 141 05/11/2022   HDL 43 05/11/2022   LDLCALC 86 05/11/2022   TRIG 59 05/11/2022   CHOLHDL 3.3 05/11/2022  -He continues on Crestor 40 mg daily without side effects. He has an appt with the Lipid clinic later this summer.  Carlus Pavlov, MD PhD [facility] Endocrinology

## 2022-11-07 ENCOUNTER — Encounter: Payer: Self-pay | Admitting: *Deleted

## 2022-11-10 ENCOUNTER — Encounter (HOSPITAL_BASED_OUTPATIENT_CLINIC_OR_DEPARTMENT_OTHER): Payer: Self-pay | Admitting: Internal Medicine

## 2022-11-10 ENCOUNTER — Ambulatory Visit (INDEPENDENT_AMBULATORY_CARE_PROVIDER_SITE_OTHER): Payer: Commercial Managed Care - PPO | Admitting: Internal Medicine

## 2022-11-10 VITALS — BP 156/73 | HR 72 | Ht 69.0 in | Wt 165.6 lb

## 2022-11-10 DIAGNOSIS — E785 Hyperlipidemia, unspecified: Secondary | ICD-10-CM

## 2022-11-10 DIAGNOSIS — Z951 Presence of aortocoronary bypass graft: Secondary | ICD-10-CM | POA: Diagnosis not present

## 2022-11-10 DIAGNOSIS — I10 Essential (primary) hypertension: Secondary | ICD-10-CM | POA: Diagnosis not present

## 2022-11-10 DIAGNOSIS — E118 Type 2 diabetes mellitus with unspecified complications: Secondary | ICD-10-CM | POA: Diagnosis not present

## 2022-11-10 DIAGNOSIS — Z794 Long term (current) use of insulin: Secondary | ICD-10-CM

## 2022-11-10 NOTE — Progress Notes (Signed)
LIPID CLINIC CONSULT NOTE  Chief Complaint:  Manage dyslipidemia  Primary Care Physician: Estanislado Pandy, MD  Primary Cardiologist:  Rollene Rotunda, MD  HPI:  Kelly Hardy is a 62 y.o. male who is being seen today for the evaluation of dyslipidemia at the request of Rollene Rotunda, MD. this is a pleasant 62 year old male with a history of coronary artery disease and type 2 diabetes status post CABG in 2019.  Other risk factors include hypertension and dyslipidemia.  His cholesterol has been reasonably well treated with a total cholesterol in May 05, 1939, HDL 43, triglycerides 59 and LDL 86.  This is on 40 mg rosuvastatin.  His target LDL should be less than 70 or if he were considered high risk less than 55.  Either way he is not at goal.  He had a recent LP(a) which was negative.  He is referred for additional therapeutic options.  He was previously trialed on ezetimibe, however he developed an extensive drug rash after his first dose.  He has been more physically active over the past 6 months, exercising more regularly and I suspect may already have an improvement in his lipids although it is unlikely that he would get to target with this alone.  PMHx:  Past Medical History:  Diagnosis Date   Coronary artery disease    Diabetes mellitus without complication (HCC)    Type I   GERD (gastroesophageal reflux disease)    Hyperlipidemia    Hypertension    Palpitations    eval 2014   Squamous cell carcinoma of skin 05/26/1999   left sideburn tx with bx    Past Surgical History:  Procedure Laterality Date   CARDIAC CATHETERIZATION N/A 02/10/2016   Procedure: Left Heart Cath and Coronary Angiography;  Surgeon: Lennette Bihari, MD;  Location: [facility] INVASIVE CV LAB;  Service: Cardiovascular;  Laterality: N/A;   CARDIAC CATHETERIZATION N/A 02/10/2016   Procedure: Coronary Stent Intervention;  Surgeon: Lennette Bihari, MD;  Location: [facility] INVASIVE CV LAB;  Service: Cardiovascular;   Laterality: N/A;   CORONARY ARTERY BYPASS GRAFT N/A 04/04/2017   Procedure: CORONARY ARTERY BYPASS GRAFTING (CABG), times one, using left internal mammary artery. Off pump;  Surgeon: Loreli Slot, MD;  Location: [facility] OR;  Service: Open Heart Surgery;  Laterality: N/A;   CORONARY STENT PLACEMENT  02/10/2016   STENT XIENCE ALPINE RX 3.25X15 drug eluting stent was successfully placed   LEFT HEART CATH AND CORONARY ANGIOGRAPHY N/A 03/23/2017   Procedure: LEFT HEART CATH AND CORONARY ANGIOGRAPHY;  Surgeon: Swaziland, Peter M, MD;  Location[facility] INVASIVE CV LAB;  Service: Cardiovascular;  Laterality: N/A;   LEFT HEART CATH AND CORS/GRAFTS ANGIOGRAPHY N/A 12/01/2021   Procedure: LEFT HEART CATH AND CORS/GRAFTS ANGIOGRAPHY;  Surgeon: Corky Crafts, MD;  Location: [facility] INVASIVE CV LAB;  Service: Cardiovascular;  Laterality: N/A;   SALIVARY GLAND SURGERY Left    benign   TEE WITHOUT CARDIOVERSION N/A 04/04/2017   Procedure: TRANSESOPHAGEAL ECHOCARDIOGRAM (TEE);  Surgeon: Loreli Slot, MD;  Location: [facility] OR;  Service: Open Heart Surgery;  Laterality: N/A;    FAMHx:  Family History  Problem Relation Age of Onset   Coronary artery disease Mother 52       Died age 73, CABG   CAD Father 29       Died age 97, CABG   CAD Brother 53       Stents   Heart failure Maternal Grandmother  SOCHx:   reports that he has never smoked. He has never used smokeless tobacco. He reports that he does not drink alcohol and does not use drugs.  ALLERGIES:  Allergies  Allergen Reactions   Zetia [Ezetimibe] Rash    ROS: Pertinent items noted in HPI and remainder of comprehensive ROS otherwise negative.  HOME MEDS: Current Outpatient Medications on File Prior to Visit  Medication Sig Dispense Refill   acetaminophen (TYLENOL) 500 MG tablet Take 2 tablets (1,000 mg total) by mouth every 8 (eight) hours as needed. 30 tablet 0   aspirin EC 81 MG tablet Take 81 mg by mouth daily.     Continuous Blood  Gluc Receiver (FREESTYLE LIBRE 2 READER) DEVI 1 each by Does not apply route daily. 1 each 0   Continuous Blood Gluc Receiver (FREESTYLE LIBRE READER) DEVI Use Libre 3 as instructed to check blood sugar 1 each 0   Continuous Blood Gluc Sensor (FREESTYLE LIBRE 2 SENSOR) MISC APPLY A NEW SENSOR EVERY 14 DAYS 6 each 3   Continuous Blood Gluc Sensor (FREESTYLE LIBRE 3 SENSOR) MISC Use as instructed to check blood sugar. 6 each 3   Glucagon 3 MG/DOSE POWD Place 3 mg into the nose once as needed for up to 1 dose. 1 each 11   insulin glargine-yfgn (SEMGLEE, YFGN,) 100 UNIT/ML Pen INJECT 16 TO 18 UNITS SUBCUTANEOUSLY ONCE DAILY IN THE EVENING 30 mL 3   Insulin Lispro-aabc (LYUMJEV KWIKPEN) 100 UNIT/ML KwikPen INJECT UP TO 30 UNITS SUBCUTANEOUSLY DAILY AS DIRECTED 30 mL 3   Insulin Pen Needle 32G X 4 MM MISC Use 4-6x a day 400 each 3   losartan (COZAAR) 25 MG tablet TAKE ONE TABLET BY MOUTH DAILY 90 tablet 3   metoprolol succinate (TOPROL-XL) 50 MG 24 hr tablet Take 1 tablet (50 mg total) by mouth daily. 30 tablet 10   Multiple Vitamins-Minerals (MULTIVITAMIN WITH MINERALS) tablet Take 1 tablet by mouth daily.     rosuvastatin (CRESTOR) 40 MG tablet TAKE ONE TABLET BY MOUTH ONCE DAILY 90 tablet 2   tadalafil (CIALIS) 5 MG tablet Take 1 tablet (5 mg total) by mouth daily as needed for erectile dysfunction. 10 tablet 0   No current facility-administered medications on file prior to visit.    LABS/IMAGING: No results found for this or any previous visit (from the past 48 hour(s)). No results found.  LIPID PANEL:    Component Value Date/Time   CHOL 141 05/11/2022 1146   TRIG 59 05/11/2022 1146   HDL 43 05/11/2022 1146   CHOLHDL 3.3 05/11/2022 1146   CHOLHDL 3 10/03/2019 1118   VLDL 8.4 10/03/2019 1118   LDLCALC 86 05/11/2022 1146    WEIGHTS: Wt Readings from Last 3 Encounters:  11/10/22 165 lb 9.6 oz (75.1 kg)  09/26/22 163 lb 3.2 oz (74 kg)  05/03/22 162 lb 6.4 oz (73.7 kg)    VITALS: BP  (!) 156/73 (BP Location: Left Arm, Patient Position: Sitting, Cuff Size: Normal)   Pulse 72   Ht 5\' 9"  (1.753 m)   Wt 165 lb 9.6 oz (75.1 kg)   SpO2 99%   BMI 24.45 kg/m   EXAM: Deferred  EKG: Deferred  ASSESSMENT: Mixed dyslipidemia, goal LDL less than 55 Coronary artery disease status post CABG in 2019 Type 2 diabetes on insulin Negative LP(a)  PLAN: 1.   Mr. Mullaly has a mixed dyslipidemia but remains above LDL over 70 or even less than 55 if considered very high risk although  he has not had a prior MI or stroke but does have type 2 diabetes and has been revascularized.  Fortunately LP(a) was negative.  He could not tolerate ezetimibe due to allergy.  He is on high intensity rosuvastatin.  He is a good candidate to add PCSK9 inhibitor therapy.  Will reach out for prior authorizations for this.  Plan follow-up with repeat lipids in about 3 to 4 months.  This could be in the office or virtual.  Thanks again for the kind referral.  Chrystie Nose, MD, [facility], FACP  [place]  [facility]  Medical Director of the Advanced Lipid Disorders &  Cardiovascular Risk Reduction Clinic Diplomate of the American Board of Clinical Lipidology Attending Cardiologist  Direct Dial: [phone]  Fax: [phone]  Website:  [email or web address]  Lisette Abu Tyreka Henneke 11/10/2022, 4:21 PM

## 2022-11-10 NOTE — Patient Instructions (Addendum)
Medication Instructions:  Dr. Rennis Golden recommends Repatha Sureclick 140mg /mL (PCSK9). This is an injectable cholesterol medication self-administered once every 14 days. This medication will likely need prior approval with your insurance company, which we will work on. If the medication is not approved initially, we may need to do an appeal with your insurance.   Administer medication in area of fatty tissue such as abdomen, outer thigh, back of upper arm - and rotate site with each injection Store medication in refrigerator until ready to administer - allow to sit at room temp for 30 mins - 1 hour prior to injection Dispose of medication in a SHARPS container - your pharmacy should be able to direct you on this and proper disposal   If you need a co-pay card for Repatha: [email or web address] If you need a co-pay card for Praluent: [email or web address]   *If you need a refill on your cardiac medications before your next appointment, please call your pharmacy*   Lab Work: FASTING lab work to check cholesterol in 3-4 months  If you have labs (blood work) drawn today and your tests are completely normal, you will receive your results only by: MyChart Message (if you have MyChart) OR A paper copy in the mail If you have any lab test that is abnormal or we need to change your treatment, we will call you to review the results.   Testing/Procedures: NONE   Follow-Up: At [facility], you and your health needs are our priority.  As part of our continuing mission to provide you with exceptional heart care, we have created designated Provider Care Teams.  These Care Teams include your primary Cardiologist (physician) and Advanced Practice Providers (APPs -  Physician Assistants and Nurse Practitioners) who all work together to provide you with the care you need, when you need it.  We recommend signing up for the patient portal called "MyChart".  Sign up  information is provided on this After Visit Summary.  MyChart is used to connect with patients for Virtual Visits (Telemedicine).  Patients are able to view lab/test results, encounter notes, upcoming appointments, etc.  Non-urgent messages can be sent to your provider as well.   To learn more about what you can do with MyChart, go to [email or web address].    Your next appointment:    3-4 months with Dr. Rennis Golden

## 2022-11-13 ENCOUNTER — Telehealth: Payer: Self-pay

## 2022-11-13 ENCOUNTER — Other Ambulatory Visit (HOSPITAL_COMMUNITY): Payer: Self-pay

## 2022-11-13 MED ORDER — REPATHA SURECLICK 140 MG/ML ~~LOC~~ SOAJ: 1.0000 mL | SUBCUTANEOUS | 3 refills | Status: DC

## 2022-11-13 NOTE — Telephone Encounter (Signed)
 Rx(s) sent to pharmacy electronically. Update sent to patient via MyChart

## 2022-11-13 NOTE — Telephone Encounter (Signed)
Pharmacy Patient Advocate Encounter   Received notification from Physician's Office/RN-JENNA E. that prior authorization for REPATHA is required/requested.   Insurance verification completed.   The patient is insured through [employer] .   Per test claim: APPROVED from 7.13.24 to 8.12.25  KEY: [id number]

## 2022-11-13 NOTE — Addendum Note (Signed)
Addended by: Lindell Spar on: 11/13/2022 02:13 PM   Modules accepted: Orders

## 2022-11-28 ENCOUNTER — Other Ambulatory Visit: Payer: Self-pay | Admitting: Cardiology

## 2022-12-07 NOTE — Addendum Note (Signed)
Addended by: Pollie Meyer on: 12/07/2022 03:10 PM   Modules accepted: Orders

## 2023-01-11 ENCOUNTER — Other Ambulatory Visit: Payer: Self-pay | Admitting: Cardiology

## 2023-01-24 NOTE — Progress Notes (Unsigned)
Cardiology Office Note:   Date:  01/25/2023  ID:  Kelly Hardy, DOB 11-13-1960, MRN [id number] PCP: Estanislado Pandy, MD  [facility] Providers Cardiologist:  Rollene Rotunda, MD {  History of Present Illness:   Kelly Hardy is a 62 y.o. male with complaints of exertional chest pain 02/01/16. OP stress Myoview done 02/09/16 showed diaphragmatic attenuation with 2mm inferior ST depression and chest pain. He was admitted the next day to [facility] for OP cath which revealed a 95% mRCA. He recieved a DES with good results.  I saw him in follow up and he had increased angina and was admitted for cath.  This demonstrated dLM-ostial LAD 90% stenosis and 45% mLAD stenosis. The RCA stent was patent. He was discharged ad re admitted Jan 2nd 2019 for elective CABG x 1 with an LIMA-LAD. He tolerated this well. EF was 55-60%.  He returns for follow up.     He did have a cath and had patent bypass graft and stenting.  Since I last saw him tired.  He is gone to exercising in the gym but noticed over these last months he has had a lot of muscle aches he thinks not related to the exercises he is doing.  He thinks it is out of proportion to both of his arms.  He is having some diffuse aches elsewhere and wonders if it could be his Crestor.  He is not having any chest pressure, neck or arm discomfort.  He is not having any shortness of breath, PND or orthopnea.  He said no palpitations, presyncope or syncope.   ROS: As stated in the HPI and negative for all other systems.  Studies Reviewed:    EKG:   EKG Interpretation Date/Time:  Thursday January 25 2023 16:36:48 EDT Ventricular Rate:  67 PR Interval:  158 QRS Duration:  98 QT Interval:  398 QTC Calculation: 420 R Axis:   80  Text Interpretation: Normal sinus rhythm Normal ECG When compared with ECG of 05-Apr-2017 06:37, No significant change since last tracing Confirmed by Rollene Rotunda ([id number]) on 01/25/2023 4:58:27 PM     Risk  Assessment/Calculations:              Physical Exam:   VS:  BP 132/84   Pulse 67   Ht 5\' 9"  (1.753 m)   Wt 164 lb (74.4 kg)   SpO2 97%   BMI 24.22 kg/m    Wt Readings from Last 3 Encounters:  01/25/23 164 lb (74.4 kg)  11/10/22 165 lb 9.6 oz (75.1 kg)  09/26/22 163 lb 3.2 oz (74 kg)     GEN: Well nourished, well developed in no acute distress NECK: No JVD; No carotid bruits CARDIAC: RRR, no murmurs, rubs, gallops RESPIRATORY:  Clear to auscultation without rales, wheezing or rhonchi  ABDOMEN: Soft, non-tender, non-distended EXTREMITIES:  No edema; No deformity   ASSESSMENT AND PLAN:   CABG:    The patient has no new sypmtoms.  No further cardiovascular testing is indicated.  We will continue with aggressive risk reduction and meds as listed.  DYSLIPIDEMIA:    He is on Repatha and Crestor.  I am going to have him hold the Crestor to see if his muscle aches improved.  He does have a NMR panel pending and is to see Dr. Rennis Golden soon.  Hopefully he will be at target with Repatha alone.   HTN:    The blood pressure is at target.  No change in therapy.  DM TYPE I:  A1c was 7.4 which is up from 6.7.  He is following with Dr.Gherghe.         Follow up with me in 12 months.   Signed, Rollene Rotunda, MD

## 2023-01-25 ENCOUNTER — Encounter: Payer: Self-pay | Admitting: Cardiology

## 2023-01-25 ENCOUNTER — Ambulatory Visit: Payer: Commercial Managed Care - PPO | Attending: Cardiology | Admitting: Cardiology

## 2023-01-25 VITALS — BP 132/84 | HR 67 | Ht 69.0 in | Wt 164.0 lb

## 2023-01-25 DIAGNOSIS — I10 Essential (primary) hypertension: Secondary | ICD-10-CM

## 2023-01-25 DIAGNOSIS — E118 Type 2 diabetes mellitus with unspecified complications: Secondary | ICD-10-CM | POA: Diagnosis not present

## 2023-01-25 DIAGNOSIS — E785 Hyperlipidemia, unspecified: Secondary | ICD-10-CM

## 2023-01-25 DIAGNOSIS — Z951 Presence of aortocoronary bypass graft: Secondary | ICD-10-CM

## 2023-01-25 NOTE — Patient Instructions (Signed)
Medication Instructions:  Stop taking Crestor medication. *If you need a refill on your cardiac medications before your next appointment, please call your pharmacy*      Follow-Up: At [facility], you and your health needs are our priority.  As part of our continuing mission to provide you with exceptional heart care, we have created designated Provider Care Teams.  These Care Teams include your primary Cardiologist (physician) and Advanced Practice Providers (APPs -  Physician Assistants and Nurse Practitioners) who all work together to provide you with the care you need, when you need it.  We recommend signing up for the patient portal called "MyChart".  Sign up information is provided on this After Visit Summary.  MyChart is used to connect with patients for Virtual Visits (Telemedicine).  Patients are able to view lab/test results, encounter notes, upcoming appointments, etc.  Non-urgent messages can be sent to your provider as well.   To learn more about what you can do with MyChart, go to [email or web address].    Your next appointment:   12 month(s)  Provider:   Rollene Rotunda, MD

## 2023-01-26 ENCOUNTER — Encounter: Payer: Self-pay | Admitting: Internal Medicine

## 2023-01-26 ENCOUNTER — Ambulatory Visit (INDEPENDENT_AMBULATORY_CARE_PROVIDER_SITE_OTHER): Payer: Commercial Managed Care - PPO | Admitting: Internal Medicine

## 2023-01-26 VITALS — BP 136/60 | HR 75 | Ht 69.0 in | Wt 163.6 lb

## 2023-01-26 DIAGNOSIS — E1059 Type 1 diabetes mellitus with other circulatory complications: Secondary | ICD-10-CM | POA: Diagnosis not present

## 2023-01-26 DIAGNOSIS — E7800 Pure hypercholesterolemia, unspecified: Secondary | ICD-10-CM

## 2023-01-26 LAB — MICROALBUMIN / CREATININE URINE RATIO
Creatinine,U: 62.7 mg/dL
Microalb Creat Ratio: 1.1 mg/g (ref 0.0–30.0)
Microalb, Ur: <0.7 mg/dL (ref 0.0–1.9)

## 2023-01-26 LAB — COMPREHENSIVE METABOLIC PANEL
ALT: 29 U/L (ref 0–53)
AST: 31 U/L (ref 0–37)
Albumin: 4.4 g/dL (ref 3.5–5.2)
Alkaline Phosphatase: 62 U/L (ref 39–117)
BUN: 20 mg/dL (ref 6–23)
CO2: 30 meq/L (ref 19–32)
Calcium: 9.8 mg/dL (ref 8.4–10.5)
Chloride: 103 meq/L (ref 96–112)
Creatinine, Ser: 0.99 mg/dL (ref 0.40–1.50)
GFR: 81.35 mL/min (ref >60.00)
Glucose, Bld: 51 mg/dL — ABNORMAL LOW (ref 70–99)
Potassium: 4.2 meq/L (ref 3.5–5.1)
Sodium: 142 meq/L (ref 135–145)
Total Bilirubin: 0.4 mg/dL (ref 0.2–1.2)
Total Protein: 7.3 g/dL (ref 6.0–8.3)

## 2023-01-26 LAB — POCT GLYCOSYLATED HEMOGLOBIN (HGB A1C): Hemoglobin A1C: 7.5 % — AB (ref 4.0–5.6)

## 2023-01-26 LAB — TSH: TSH: 2.4 u[IU]/mL (ref 0.35–5.50)

## 2023-01-26 NOTE — Patient Instructions (Addendum)
Please decrease: - Semglee 14 units in a.m.  Change: - Lyumjev ICR:  - 1:8, except 1:10 for lunch >> round the dose up - 1:6 with starches: rice, pasta, potatoes, etc. Target: 120 Sensitivity factor: 1:40, but 60 in the evening and overnight Count 50% of the proteins in a meal as carbs for low-carb meal. Bolus 3-4 units before coffee, unless you drink this 30 min before b'fast. In that case, bolus only the amount for b'fast but inject before coffee. Try not to correct sugars <250 at bedtime.  Please do the following approximately before every meal: - count carbs (C) - check sugars (S) - inject insulin (I)  Please return in 4 months.

## 2023-01-26 NOTE — Progress Notes (Unsigned)
Patient ID: Kelly Hardy, male   DOB: January 22, 1961, 62 y.o.   MRN: [id number]   HPI: Kelly Hardy is a 62 y.o.-year-old male, initially referred by his cardiologist, Dr. Antoine Poche, returning for follow-up for DM type I, initially dx in 1993 as DM type II, insulin-dependent since 1994, uncontrolled, with complications (CAD - s/p DES 2017, CABG x1 2019; PN). He saw Dr. Evlyn Kanner until few years ago.  Last visit with me 4 months ago.  Interim history: No increased urination, blurry vision, nausea, chest pain.  He retired last year.  continues to go to the gym 3x a week with a friend. He also has a gym at home. He had significant joint pain since last visit and had to come off Crestor.  Reviewed HbA1c levels: Lab Results  Component Value Date   HGBA1C 7.4 (A) 09/26/2022   HGBA1C 7.2 (A) 05/03/2022   HGBA1C 7.2 (A) 12/30/2021   HGBA1C 6.7 (A) 08/26/2021   HGBA1C 7.4 (A) 04/28/2021   HGBA1C 7.9 (A) 12/23/2020   HGBA1C 7.4 (A) 07/09/2020   HGBA1C 7.7 (A) 03/09/2020   HGBA1C 7.5 (A) 10/03/2019   HGBA1C 7.8 (A) 07/18/2019  10/11/2018: HbA1c 8.3% 10/19/2017: HbA1c 8.5%  Pt was on a regimen of: - Humalog 75/25 15 units 2x a day before meals - Humalog 2-8 units 3x a day, before meals - per sliding scale: ISF 50, target 120, ICR ~1:20  Currently on: - Semglee 18 >> 16 units in a.m. - Humalog >> Lyumjev: ICR:  - 1:8 - 1:6  with starches: rice, pasta, potatoes, etc. Target: 120 >> actually using 90-100 >> 120 Sensitivity factor: 1:40, but 60 in the evening and overnight  Bolus few units of Lyumjev - start with 2-3, before coffee. He was previously on Toujeo.  He checks his sugars more than 4 times a day with his freestyle libre CGM:  Previously:  Previously:  Lowest sugar was 39 ...>> 59 >> 54 >> 50s; he has hypoglycemia awareness in the 80s. Highest sugar was 300s >> 400 >> 300s >> 300s.  Glucometer: One Touch Verio  -No CKD, last BUN/creatinine:  Lab Results  Component Value  Date   BUN 13 11/28/2021   BUN 17 10/03/2019   CREATININE 1.09 11/28/2021   CREATININE 0.85 10/03/2019   Lab Results  Component Value Date   MICRALBCREAT 1.5 10/03/2019  On Cozaar 25.  -+ HL; last lipid panel: Lab Results  Component Value Date   CHOL 141 05/11/2022   HDL 43 05/11/2022   LDLCALC 86 05/11/2022   TRIG 59 05/11/2022   CHOLHDL 3.3 05/11/2022  10/11/2018: 106/41/36/62 10/19/2017: 112/46/41/62 Prev. on Crestor 40 mg daily >> now off 2/2 joint pains.  Now seen in the lipid clinic.  Repatha was started.  - last eye exam was 02/2022: No DR reportedly - [facility].  -He has numbness and tingling in his toes.  He also has occasional leg cramps, plantar fasciitis.  Last foot exam 09/26/2022.  He is on ASA 81.  Lab Results  Component Value Date   TSH 1.53 10/03/2019   He also has a history of HTN. He had intermittent chest pain and saw Dr. Antoine Poche.  A cardiac cath was clear in 2023.  Pt has FH of DM in daughter - type 1 - dx. At 24 y/o. His wife had breast cancer.  ROS: + See HPI  I reviewed pt's medications, allergies, PMH, social hx, family hx, and changes were documented in the history of present  illness. Otherwise, unchanged from my initial visit note.  Past Medical History:  Diagnosis Date   Coronary artery disease    Diabetes mellitus without complication (HCC)    Type I   GERD (gastroesophageal reflux disease)    Hyperlipidemia    Hypertension    Palpitations    eval 2014   Squamous cell carcinoma of skin 05/26/1999   left sideburn tx with bx   Past Surgical History:  Procedure Laterality Date   CARDIAC CATHETERIZATION N/A 02/10/2016   Procedure: Left Heart Cath and Coronary Angiography;  Surgeon: Lennette Bihari, MD;  Location: [facility] INVASIVE CV LAB;  Service: Cardiovascular;  Laterality: N/A;   CARDIAC CATHETERIZATION N/A 02/10/2016   Procedure: Coronary Stent Intervention;  Surgeon: Lennette Bihari, MD;  Location: [facility] INVASIVE CV LAB;  Service:  Cardiovascular;  Laterality: N/A;   CORONARY ARTERY BYPASS GRAFT N/A 04/04/2017   Procedure: CORONARY ARTERY BYPASS GRAFTING (CABG), times one, using left internal mammary artery. Off pump;  Surgeon: Loreli Slot, MD;  Location: [facility] OR;  Service: Open Heart Surgery;  Laterality: N/A;   CORONARY STENT PLACEMENT  02/10/2016   STENT XIENCE ALPINE RX 3.25X15 drug eluting stent was successfully placed   LEFT HEART CATH AND CORONARY ANGIOGRAPHY N/A 03/23/2017   Procedure: LEFT HEART CATH AND CORONARY ANGIOGRAPHY;  Surgeon: Swaziland, Peter M, MD;  Location: [facility] INVASIVE CV LAB;  Service: Cardiovascular;  Laterality: N/A;   LEFT HEART CATH AND CORS/GRAFTS ANGIOGRAPHY N/A 12/01/2021   Procedure: LEFT HEART CATH AND CORS/GRAFTS ANGIOGRAPHY;  Surgeon: Corky Crafts, MD;  Location: [facility] INVASIVE CV LAB;  Service: Cardiovascular;  Laterality: N/A;   SALIVARY GLAND SURGERY Left    benign   TEE WITHOUT CARDIOVERSION N/A 04/04/2017   Procedure: TRANSESOPHAGEAL ECHOCARDIOGRAM (TEE);  Surgeon: Loreli Slot, MD;  Location: [facility] OR;  Service: Open Heart Surgery;  Laterality: N/A;   Social History   Socioeconomic History   Marital status: Married    Spouse name: Not on file   Number of children: 2   Years of education: Not on file   Highest education level: Not on file  Occupational History   Occupation: Production designer, 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 30 tablet 0   aspirin EC  81 MG tablet Take 81 mg by mouth daily.     Continuous Blood Gluc Receiver (FREESTYLE LIBRE 2 READER) DEVI 1 each by Does not apply route daily. 1 each 0   Continuous Blood Gluc Receiver (FREESTYLE LIBRE READER) DEVI Use Libre 3 as instructed to check blood sugar 1 each 0   Continuous Blood Gluc Sensor (FREESTYLE LIBRE 2 SENSOR) MISC APPLY A NEW SENSOR EVERY 14 DAYS 6 each 3   Continuous Blood Gluc Sensor (FREESTYLE LIBRE 3 SENSOR) MISC Use as instructed to check blood sugar. 6 each 3   Evolocumab (REPATHA SURECLICK)  140 MG/ML SOAJ Inject 140 mg into the skin every 14 (fourteen) days. 6 mL 3   Glucagon 3 MG/DOSE POWD Place 3 mg into the nose once as needed for up to 1 dose. 1 each 11   insulin glargine-yfgn (SEMGLEE, YFGN,) 100 UNIT/ML Pen INJECT 16 TO 18 UNITS SUBCUTANEOUSLY ONCE DAILY IN THE EVENING 30 mL 3   Insulin Lispro-aabc (LYUMJEV KWIKPEN) 100 UNIT/ML KwikPen INJECT UP TO 30 UNITS SUBCUTANEOUSLY DAILY AS DIRECTED 30 mL 3   Insulin Pen Needle 32G X 4 MM MISC Use 4-6x a day 400 each 3   losartan (COZAAR) 25 MG tablet TAKE ONE TABLET BY MOUTH DAILY 90 tablet 3   metoprolol succinate (TOPROL-XL) 50 MG 24 hr tablet TAKE ONE TABLET BY MOUTH EVERY DAY 30 tablet 0   Multiple Vitamins-Minerals (MULTIVITAMIN WITH MINERALS) tablet Take 1 tablet by mouth daily.     tadalafil (CIALIS) 5 MG tablet Take 1 tablet (5 mg total) by mouth daily as needed for erectile dysfunction. 10 tablet 0   No current facility-administered medications on file prior to visit.   Allergies  Allergen Reactions   Zetia [Ezetimibe] Rash   Family History  Problem Relation Age of Onset   Coronary artery disease Mother 61       Died age 46, CABG   CAD Father 73       Died age 33, CABG   CAD Brother 2       Stents   Heart failure Maternal Grandmother    PE: There were no vitals taken for this visit. Wt Readings from Last 10 Encounters:  01/25/23 164 lb (74.4 kg)  11/10/22 165 lb 9.6 oz (75.1 kg)  09/26/22 163 lb  3.2 oz (74 kg)  05/03/22 162 lb 6.4 oz (73.7 kg)  12/30/21 159 lb 3.2 oz (72.2 kg)  12/16/21 157 lb 6.4 oz (71.4 kg)  12/01/21 155 lb (70.3 kg)  11/28/21 158 lb (71.7 kg)  08/26/21 162 lb (73.5 kg)  04/28/21 157 lb 9.6 oz (71.5 kg)   Constitutional: normal weight, in NAD Eyes: EOMI, no exophthalmos ENT: no thyromegaly, no cervical lymphadenopathy Cardiovascular: tachycardia, RR, No MRG, + wears compression hoses for leg swelling  Respiratory: CTA B Musculoskeletal: no deformities Skin: no rashes Neurological: no tremor with outstretched hands  ASSESSMENT: 1. DM1, insulin-dependent, uncontrolled, with complications - CAD - s/p DES 2017, CABG x1 04/2017 - PN  We diagnosed type 1 diabetes based on the low C-peptide suggesting insulin deficiency: Component     Latest Ref Rng & Units 12/31/2018  ZNT8 Antibodies     U/mL <15  Glutamic Acid Decarb Ab     <5 IU/mL <5  C-Peptide     0.80 - 3.85 ng/mL <0.10 (L)  Glucose, Plasma     65 - 99 mg/dL 94  POC Glucose     70 - 99 mg/dl 607 (A)  Islet Cell Ab     Neg:<1:1 Negative   2. HL  PLAN:  1. Patient with longstanding, uncontrolled, type 1 diabetes, diagnosed in 12/2018, with fluctuating blood sugars consistent with insulin deficiency.  He did have improvement in blood sugars after switching from Humalog to Lyumjev and adjusting the insulin to carb ratios.  At last visit, HbA1c was higher, though, at 7.4%.  However, reviewing the CGM trends from 1 month prior to the visit, his sugars appeared to be the best he had in a long time.  There were still fluctuations in the blood sugars and occasionally  higher blood sugars after dinner, but overall, the sugars are fluctuating within the more narrow range, with only occasional lows.  In the months prior to the visit, he had less lows and slightly more high blood sugars as he relaxed his diet but he was planning to improve this.  He also continued to exercise which I felt was helping.  I did not  suggest a change in regimen.  I refilled his insulins to [facility] pharmacy. CGM interpretation: -At today's visit, we reviewed his CGM downloads: It appears that 65% of values are in target range (goal >70%), while 33% are higher than 180 (goal <25%), and 2% are lower than 70 (goal <4%).  The calculated average blood sugar is 160.  The projected HbA1c for the next 3 months (GMI) is 7.1%. -Reviewing the CGM trends, sugars appear to be decreasing overnight, sometimes into the 50s, with an increase after waking up, I believe he does not eat or drink coffee.  To eliminate the risk of abruptly decreasing blood sugars overnight or hypoglycemia, I advised him to reduce his Semglee dose, we also discussed about only correcting blood sugars in the 250s or higher at night.  He does not usually correct the sugars at at bedtime with a lot of insulin, but he may need to be even more conservative.  However, we discussed that he may need higher doses of insulin before meals.  I advised him to keep the same insulin to carb ratios but around the dose up.  We also discussed again about counting 50% of the protein amount as carbs whenever calculating mealtime boluses.  I advised him that if he separates down coffee from breakfast by more than 30 minutes, to separately bolus 3 to 4 units before coffee, however, if the copy is closer than 30 minutes from breakfast, to bolus for breakfast before starting to drink coffee. - I suggested to:  Patient Instructions  Please decrease: - Semglee 14 units in a.m.  Change: - Lyumjev ICR:  - 1:8, except 1:10 for lunch >> round the dose up - 1:6 with starches: rice, pasta, potatoes, etc. Target: 120 Sensitivity factor: 1:40, but 60 in the evening and overnight Count 50% of the proteins in a meal as carbs for low-carb meal. Bolus 3-4 units before coffee, unless you drink this 30 min before b'fast. In that case, bolus only the amount for b'fast but inject before coffee. Try not to  correct sugars <250 at bedtime.  Please do the following approximately before every meal: - count carbs (C) - check sugars (S) - inject insulin (I)  Please return in 4 months.   - we checked his HbA1c: 7.5% (higher) - advised to check sugars at different times of the day - 4x a day, rotating check times - advised for yearly eye exams >> he is UTD - will check a CMP and ACR today - return to clinic in 4 months  2. HL -Reviewed latest lipid panel from 05/2022: LDL above our target of less than 55 due to cardiovascular disease, otherwise fractions at goal: Lab Results  Component Value Date   CHOL 141 05/11/2022   HDL 43 05/11/2022   LDLCALC 86 05/11/2022   TRIG 59 05/11/2022   CHOLHDL 3.3 05/11/2022  -He continues on Crestor 40 mg daily without side effects.  She also started to see the lipid clinic and was started on Repatha.  A PA for this was recently approved.  Carlus Pavlov, MD PhD [facility] Endocrinology

## 2023-02-12 ENCOUNTER — Other Ambulatory Visit: Payer: Self-pay | Admitting: Cardiology

## 2023-02-14 NOTE — Addendum Note (Signed)
Addended by: Pollie Meyer on: 02/14/2023 08:14 AM   Modules accepted: Orders

## 2023-02-16 LAB — NMR, LIPOPROFILE
Cholesterol, Total: 160 mg/dL (ref 100–199)
HDL Particle Number: 30.8 umol/L (ref >=30.5)
HDL-C: 47 mg/dL (ref >39)
LDL Particle Number: 968 nmol/L (ref <1000)
LDL Size: 21 nmol (ref >20.5)
LDL-C (NIH Calc): 101 mg/dL — ABNORMAL HIGH (ref 0–99)
LP-IR Score: 33 (ref <=45)
Small LDL Particle Number: 411 nmol/L (ref <=527)
Triglycerides: 57 mg/dL (ref 0–149)

## 2023-02-19 ENCOUNTER — Encounter: Payer: Self-pay | Admitting: Internal Medicine

## 2023-02-19 ENCOUNTER — Ambulatory Visit: Payer: Commercial Managed Care - PPO | Attending: Internal Medicine | Admitting: Internal Medicine

## 2023-02-19 VITALS — BP 160/78 | HR 67 | Ht 69.0 in | Wt 166.2 lb

## 2023-02-19 DIAGNOSIS — E118 Type 2 diabetes mellitus with unspecified complications: Secondary | ICD-10-CM

## 2023-02-19 DIAGNOSIS — Z951 Presence of aortocoronary bypass graft: Secondary | ICD-10-CM | POA: Diagnosis not present

## 2023-02-19 DIAGNOSIS — E785 Hyperlipidemia, unspecified: Secondary | ICD-10-CM | POA: Diagnosis not present

## 2023-02-19 MED ORDER — ROSUVASTATIN CALCIUM 5 MG PO TABS: 5.0000 mg | ORAL_TABLET | Freq: Every day | ORAL | 3 refills | Status: DC

## 2023-02-19 NOTE — Patient Instructions (Signed)
Medication Instructions:  In 2 weeks start taking Rosuvastatin 5 mg daily. New script sent.  *If you need a refill on your cardiac medications before your next appointment, please call your pharmacy*   Lab Work: FASTING NMR in 3 -4 months If you have labs (blood work) drawn today and your tests are completely normal, you will receive your results only by: MyChart Message (if you have MyChart) OR A paper copy in the mail If you have any lab test that is abnormal or we need to change your treatment, we will call you to review the results.    Follow-Up: At [facility], you and your health needs are our priority.  As part of our continuing mission to provide you with exceptional heart care, we have created designated Provider Care Teams.  These Care Teams include your primary Cardiologist (physician) and Advanced Practice Providers (APPs -  Physician Assistants and Nurse Practitioners) who all work together to provide you with the care you need, when you need it.  We recommend signing up for the patient portal called "MyChart".  Sign up information is provided on this After Visit Summary.  MyChart is used to connect with patients for Virtual Visits (Telemedicine).  Patients are able to view lab/test results, encounter notes, upcoming appointments, etc.  Non-urgent messages can be sent to your provider as well.   To learn more about what you can do with MyChart, go to [email or web address].    Your next appointment:   4 month(s)  Provider:   Zoila Shutter MD  May double book if necessary per MD.

## 2023-02-19 NOTE — Progress Notes (Unsigned)
LIPID CLINIC CONSULT NOTE  Chief Complaint:  Manage dyslipidemia  Primary Care Physician: Estanislado Pandy, MD  Primary Cardiologist:  Rollene Rotunda, MD  HPI:  Kelly Hardy is a 62 y.o. male who is being seen today for the evaluation of dyslipidemia at the request of Sasser, Clarene Critchley, MD. this is a pleasant 62 year old male with a history of coronary artery disease and type 2 diabetes status post CABG in 2019.  Other risk factors include hypertension and dyslipidemia.  His cholesterol has been reasonably well treated with a total cholesterol in May 05, 1939, HDL 43, triglycerides 59 and LDL 86.  This is on 40 mg rosuvastatin.  His target LDL should be less than 70 or if he were considered high risk less than 55.  Either way he is not at goal.  He had a recent LP(a) which was negative.  He is referred for additional therapeutic options.  He was previously trialed on ezetimibe, however he developed an extensive drug rash after his first dose.  He has been more physically active over the past 6 months, exercising more regularly and I suspect may already have an improvement in his lipids although it is unlikely that he would get to target with this alone.  PMHx:  Past Medical History:  Diagnosis Date   Coronary artery disease    Diabetes mellitus without complication (HCC)    Type I   GERD (gastroesophageal reflux disease)    Hyperlipidemia    Hypertension    Palpitations    eval 2014   Squamous cell carcinoma of skin 05/26/1999   left sideburn tx with bx    Past Surgical History:  Procedure Laterality Date   CARDIAC CATHETERIZATION N/A 02/10/2016   Procedure: Left Heart Cath and Coronary Angiography;  Surgeon: Lennette Bihari, MD;  Location: [facility] INVASIVE CV LAB;  Service: Cardiovascular;  Laterality: N/A;   CARDIAC CATHETERIZATION N/A 02/10/2016   Procedure: Coronary Stent Intervention;  Surgeon: Lennette Bihari, MD;  Location: [facility] INVASIVE CV LAB;  Service: Cardiovascular;   Laterality: N/A;   CORONARY ARTERY BYPASS GRAFT N/A 04/04/2017   Procedure: CORONARY ARTERY BYPASS GRAFTING (CABG), times one, using left internal mammary artery. Off pump;  Surgeon: Loreli Slot, MD;  Location: [facility] OR;  Service: Open Heart Surgery;  Laterality: N/A;   CORONARY STENT PLACEMENT  02/10/2016   STENT XIENCE ALPINE RX 3.25X15 drug eluting stent was successfully placed   LEFT HEART CATH AND CORONARY ANGIOGRAPHY N/A 03/23/2017   Procedure: LEFT HEART CATH AND CORONARY ANGIOGRAPHY;  Surgeon: Swaziland, Peter M, MD;  Location: [facility] INVASIVE CV LAB;  Service: Cardiovascular;  Laterality: N/A;   LEFT HEART CATH AND CORS/GRAFTS ANGIOGRAPHY N/A 12/01/2021   Procedure: LEFT HEART CATH AND CORS/GRAFTS ANGIOGRAPHY;  Surgeon: Corky Crafts, MD;  Location: [facility] INVASIVE CV LAB;  Service: Cardiovascular;  Laterality: N/A;   SALIVARY GLAND SURGERY Left    benign   TEE WITHOUT CARDIOVERSION N/A 04/04/2017   Procedure: TRANSESOPHAGEAL ECHOCARDIOGRAM (TEE);  Surgeon: Loreli Slot, MD;  Location: [facility] OR;  Service: Open Heart Surgery;  Laterality: N/A;    FAMHx:  Family History  Problem Relation Age of Onset   Coronary artery disease Mother 60       Died age 78, CABG   CAD Father 29       Died age 59, CABG   CAD Brother 42       Stents   Heart failure Maternal Grandmother  SOCHx:   reports that he has never smoked. He has never used smokeless tobacco. He reports that he does not drink alcohol and does not use drugs.  ALLERGIES:  Allergies  Allergen Reactions   Zetia [Ezetimibe] Rash    ROS: Pertinent items noted in HPI and remainder of comprehensive ROS otherwise negative.  HOME MEDS: Current Outpatient Medications on File Prior to Visit  Medication Sig Dispense Refill   acetaminophen (TYLENOL) 500 MG tablet Take 2 tablets (1,000 mg total) by mouth every 8 (eight) hours as needed. 30 tablet 0   aspirin EC 81 MG tablet Take 81 mg by mouth daily.     Evolocumab  (REPATHA SURECLICK) 140 MG/ML SOAJ Inject 140 mg into the skin every 14 (fourteen) days. 6 mL 3   Glucagon 3 MG/DOSE POWD Place 3 mg into the nose once as needed for up to 1 dose. 1 each 11   insulin glargine-yfgn (SEMGLEE, YFGN,) 100 UNIT/ML Pen INJECT 16 TO 18 UNITS SUBCUTANEOUSLY ONCE DAILY IN THE EVENING 30 mL 3   Insulin Lispro-aabc (LYUMJEV KWIKPEN) 100 UNIT/ML KwikPen INJECT UP TO 30 UNITS SUBCUTANEOUSLY DAILY AS DIRECTED 30 mL 3   losartan (COZAAR) 25 MG tablet TAKE ONE TABLET BY MOUTH DAILY 90 tablet 3   metoprolol succinate (TOPROL-XL) 50 MG 24 hr tablet TAKE ONE TABLET BY MOUTH EVERY DAY 30 tablet 11   Multiple Vitamins-Minerals (MULTIVITAMIN WITH MINERALS) tablet Take 1 tablet by mouth daily.     tadalafil (CIALIS) 5 MG tablet Take 1 tablet (5 mg total) by mouth daily as needed for erectile dysfunction. 10 tablet 0   Continuous Blood Gluc Receiver (FREESTYLE LIBRE 2 READER) DEVI 1 each by Does not apply route daily. 1 each 0   Continuous Blood Gluc Receiver (FREESTYLE LIBRE READER) DEVI Use Libre 3 as instructed to check blood sugar 1 each 0   Continuous Blood Gluc Sensor (FREESTYLE LIBRE 2 SENSOR) MISC APPLY A NEW SENSOR EVERY 14 DAYS 6 each 3   Continuous Blood Gluc Sensor (FREESTYLE LIBRE 3 SENSOR) MISC Use as instructed to check blood sugar. 6 each 3   Insulin Pen Needle 32G X 4 MM MISC Use 4-6x a day 400 each 3   No current facility-administered medications on file prior to visit.    LABS/IMAGING: No results found for this or any previous visit (from the past 48 hour(s)). No results found.  LIPID PANEL:    Component Value Date/Time   CHOL 141 05/11/2022 1146   TRIG 59 05/11/2022 1146   HDL 43 05/11/2022 1146   CHOLHDL 3.3 05/11/2022 1146   CHOLHDL 3 10/03/2019 1118   VLDL 8.4 10/03/2019 1118   LDLCALC 86 05/11/2022 1146    WEIGHTS: Wt Readings from Last 3 Encounters:  02/19/23 166 lb 3.2 oz (75.4 kg)  01/26/23 163 lb 9.6 oz (74.2 kg)  01/25/23 164 lb (74.4 kg)     VITALS: BP (!) 160/78 (BP Location: Left Arm, Patient Position: Sitting, Cuff Size: Normal)   Pulse 67   Ht 5\' 9"  (1.753 m)   Wt 166 lb 3.2 oz (75.4 kg)   SpO2 97%   BMI 24.54 kg/m   EXAM: Deferred  EKG: Deferred  ASSESSMENT: Mixed dyslipidemia, goal LDL less than 55 Coronary artery disease status post CABG in 2019 Type 2 diabetes on insulin Negative LP(a)  PLAN: 1.   Mr. Corlett has a mixed dyslipidemia but remains above LDL over 70 or even less than 55 if considered very high risk although  he has not had a prior MI or stroke but does have type 2 diabetes and has been revascularized.  Fortunately LP(a) was negative.  He could not tolerate ezetimibe due to allergy.  He is on high intensity rosuvastatin.  He is a good candidate to add PCSK9 inhibitor therapy.  Will reach out for prior authorizations for this.  Plan follow-up with repeat lipids in about 3 to 4 months.  This could be in the office or virtual.  Thanks again for the kind referral.  Chrystie Nose, MD, [facility], FACP  [place]  [facility]  Medical Director of the Advanced Lipid Disorders &  Cardiovascular Risk Reduction Clinic Diplomate of the American Board of Clinical Lipidology Attending Cardiologist  Direct Dial: [phone]  Fax: [phone]  Website:  [email or web address] Nicely Etienne Millward 02/19/2023, 2:58 PM

## 2023-03-30 ENCOUNTER — Other Ambulatory Visit: Payer: Self-pay | Admitting: Cardiology

## 2023-05-08 ENCOUNTER — Other Ambulatory Visit: Payer: Self-pay | Admitting: Internal Medicine

## 2023-05-09 NOTE — Telephone Encounter (Signed)
 Medication refill request complete

## 2023-05-30 ENCOUNTER — Ambulatory Visit (INDEPENDENT_AMBULATORY_CARE_PROVIDER_SITE_OTHER): Payer: Commercial Managed Care - PPO | Admitting: Internal Medicine

## 2023-05-30 ENCOUNTER — Encounter: Payer: Self-pay | Admitting: Internal Medicine

## 2023-05-30 ENCOUNTER — Other Ambulatory Visit: Payer: Self-pay | Admitting: Internal Medicine

## 2023-05-30 VITALS — BP 124/70 | HR 75 | Ht 69.0 in | Wt 170.2 lb

## 2023-05-30 DIAGNOSIS — E1059 Type 1 diabetes mellitus with other circulatory complications: Secondary | ICD-10-CM | POA: Diagnosis not present

## 2023-05-30 DIAGNOSIS — E7800 Pure hypercholesterolemia, unspecified: Secondary | ICD-10-CM | POA: Diagnosis not present

## 2023-05-30 LAB — POCT GLYCOSYLATED HEMOGLOBIN (HGB A1C): Hemoglobin A1C: 7.6 % — AB (ref 4.0–5.6)

## 2023-05-30 NOTE — Patient Instructions (Addendum)
 Please continue: - Semglee 16 units in a.m. - Lyumjev ICR:  - 1:8, except 1:10 for lunch >> round the dose up - 1:6 with starches: rice, pasta, potatoes, etc. Target: 120 Sensitivity factor: 1:40, but 60 in the evening and overnight Count 50% of the proteins in a meal as carbs for low-carb meal. Bolus 3-4 units before coffee, unless you drink this 30 min before b'fast. In that case, bolus only the amount for b'fast but inject before coffee. Try not to correct sugars <250 at bedtime.  Please do the following approximately before every meal: - count carbs (C) - check sugars (S) - inject insulin (I)  Please return in 3-4 months.

## 2023-05-30 NOTE — Progress Notes (Signed)
 Patient ID: Kelly Hardy, male   DOB: [birth year], 63 y.o.   MRN: [id number]   HPI: Kelly Hardy is a 63 y.o.-year-old male, initially referred by his cardiologist, Dr. Antoine Poche, returning for follow-up for DM type I, initially dx in 1993 as DM type II, insulin-dependent since 1994, uncontrolled, with complications (CAD - s/p DES 2017, CABG x1 2019; PN). He saw Dr. Evlyn Kanner until few years ago.  Last visit with me 4 months ago.  Interim history: No increased urination, blurry vision, nausea, chest pain.   Reviewed HbA1c levels: Lab Results  Component Value Date   HGBA1C 7.5 (A) 01/26/2023   HGBA1C 7.4 (A) 09/26/2022   HGBA1C 7.2 (A) 05/03/2022   HGBA1C 7.2 (A) 12/30/2021   HGBA1C 6.7 (A) 08/26/2021   HGBA1C 7.4 (A) 04/28/2021   HGBA1C 7.9 (A) 12/23/2020   HGBA1C 7.4 (A) 07/09/2020   HGBA1C 7.7 (A) 03/09/2020   HGBA1C 7.5 (A) 10/03/2019  10/11/2018: HbA1c 8.3% 10/19/2017: HbA1c 8.5%  Pt was on a regimen of: - Humalog 75/25 15 units 2x a day before meals - Humalog 2-8 units 3x a day, before meals - per sliding scale: ISF 50, target 120, ICR ~1:20  Currently on: - Semglee 18 >> 16 >> 14 >> he increased back to 16 units in a.m. - Humalog >> Lyumjev: ICR:  - 1:8 - 1:6  with starches: rice, pasta, potatoes, etc. Target: 120 >> actually using 90-100 >> 120 Sensitivity factor: 1:40, but 60 in the evening and overnight  Bolus few units of Lyumjev - start with 2-3, before coffee. He was previously on Toujeo.  He checks his sugars more than 4 times a day with his freestyle libre CGM:  Previously:  Previously:   Lowest sugar was 39 ... 50s >> ; he has hypoglycemia awareness in the 10s. Highest sugar was 400 >> 300s >> 300s.  Glucometer: One Touch Verio  -No CKD, last BUN/creatinine:  Lab Results  Component Value Date   BUN 20 01/26/2023   BUN 13 11/28/2021   CREATININE 0.99 01/26/2023   CREATININE 1.09 11/28/2021   Lab Results  Component Value Date   MICRALBCREAT 1.1  01/26/2023   MICRALBCREAT 1.5 10/03/2019  On Cozaar 25.  -+ HL; last lipid panel: Lab Results  Component Value Date   CHOL 141 05/11/2022   HDL 43 05/11/2022   LDLCALC 86 05/11/2022   TRIG 59 05/11/2022   CHOLHDL 3.3 05/11/2022  Prev. on Crestor 40 mg daily >> now off 2/2 joint pains.  Now seen in the lipid clinic.  Repatha was started. Dr. Rennis Golden added Crestor 5 mg daily.  - last eye exam was 02/2022: No DR reportedly - [facility]. Has incipient cataract.  -He has numbness and tingling in his toes.  He also has occasional leg cramps, plantar fasciitis.  Last foot exam 09/26/2022.  He is on ASA 81.  Lab Results  Component Value Date   TSH 2.40 01/26/2023   He also has a history of HTN. He had intermittent chest pain and saw Dr. Antoine Poche.  A cardiac cath was clear in 2023.  Pt has FH of DM in daughter - type 1 - dx. At 85 y/o. His wife had breast cancer.  ROS: + See HPI  I reviewed pt's medications, allergies, PMH, social hx, family hx, and changes were documented in the history of present illness. Otherwise, unchanged from my initial visit note.  Past Medical History:  Diagnosis Date   Coronary artery disease  Diabetes mellitus without complication (HCC)    Type I   GERD (gastroesophageal reflux disease)    Hyperlipidemia    Hypertension    Palpitations    eval 2014   Squamous cell carcinoma of skin 05/26/1999   left sideburn tx with bx   Past Surgical History:  Procedure Laterality Date   CARDIAC CATHETERIZATION N/A 02/10/2016   Procedure: Left Heart Cath and Coronary Angiography;  Surgeon: Lennette Bihari, MD;  Location: [facility] INVASIVE CV LAB;  Service: Cardiovascular;  Laterality: N/A;   CARDIAC CATHETERIZATION N/A 02/10/2016   Procedure: Coronary Stent Intervention;  Surgeon: Lennette Bihari, MD;  Location: [facility] INVASIVE CV LAB;  Service: Cardiovascular;  Laterality: N/A;   CORONARY ARTERY BYPASS GRAFT N/A 04/04/2017   Procedure: CORONARY ARTERY BYPASS GRAFTING  (CABG), times one, using left internal mammary artery. Off pump;  Surgeon: Loreli Slot, MD;  Location: [facility] OR;  Service: Open Heart Surgery;  Laterality: N/A;   CORONARY STENT PLACEMENT  02/10/2016   STENT XIENCE ALPINE RX 3.25X15 drug eluting stent was successfully placed   LEFT HEART CATH AND CORONARY ANGIOGRAPHY N/A 03/23/2017   Procedure: LEFT HEART CATH AND CORONARY ANGIOGRAPHY;  Surgeon: Swaziland, Peter M, MD;  Location: [facility] INVASIVE CV LAB;  Service: Cardiovascular;  Laterality: N/A;   LEFT HEART CATH AND CORS/GRAFTS ANGIOGRAPHY N/A 12/01/2021   Procedure: LEFT HEART CATH AND CORS/GRAFTS ANGIOGRAPHY;  Surgeon: Corky Crafts, MD;  Location: [facility] INVASIVE CV LAB;  Service: Cardiovascular;  Laterality: N/A;   SALIVARY GLAND SURGERY Left    benign   TEE WITHOUT CARDIOVERSION N/A 04/04/2017   Procedure: TRANSESOPHAGEAL ECHOCARDIOGRAM (TEE);  Surgeon: Loreli Slot, MD;  Location: [facility] OR;  Service: Open Heart Surgery;  Laterality: N/A;   Social History   Socioeconomic History   Marital status: Married    Spouse name: Not on file   Number of children: 2   Years of education: Not on file   Highest education level: Not on file  Occupational History   Occupation: Production designer, 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 30 tablet 0   aspirin EC 81 MG tablet Take 81 mg by mouth daily.     Continuous Blood Gluc Receiver (FREESTYLE LIBRE 2 READER) DEVI 1 each by Does  not apply route daily. 1 each 0   Continuous Blood Gluc Receiver (FREESTYLE LIBRE READER) DEVI Use Libre 3 as instructed to check blood sugar 1 each 0   Continuous Blood Gluc Sensor (FREESTYLE LIBRE 3 SENSOR) MISC Use as instructed to check blood sugar. 6 each 3   Continuous Glucose Sensor (FREESTYLE LIBRE 2 SENSOR) MISC APPLY A NEW SENSOR EVERY 14 DAYS 6 each 3   Evolocumab (REPATHA SURECLICK) 140 MG/ML SOAJ Inject 140 mg into the skin every 14 (fourteen) days. 6 mL 3   Glucagon 3 MG/DOSE POWD Place 3  mg into the nose once as needed for up to 1 dose. 1 each 11   insulin glargine-yfgn (SEMGLEE, YFGN,) 100 UNIT/ML Pen INJECT 16 TO 18 UNITS SUBCUTANEOUSLY ONCE DAILY IN THE EVENING 30 mL 3   Insulin Lispro-aabc (LYUMJEV KWIKPEN) 100 UNIT/ML KwikPen INJECT UP TO 30 UNITS SUBCUTANEOUSLY DAILY AS DIRECTED 30 mL 3   Insulin Pen Needle 32G X 4 MM MISC Use 4-6x a day 400 each 3   losartan (COZAAR) 25 MG tablet TAKE ONE TABLET BY MOUTH EVERY DAY 90 tablet 3   metoprolol succinate (TOPROL-XL) 50 MG 24 hr tablet TAKE ONE TABLET BY MOUTH EVERY DAY 30 tablet 11   Multiple Vitamins-Minerals (MULTIVITAMIN WITH MINERALS) tablet Take 1 tablet by mouth daily.     rosuvastatin (CRESTOR) 5 MG tablet Take 1 tablet (5 mg total) by mouth daily. Wait to start in 2 weeks. 90 tablet 3   tadalafil (CIALIS) 5 MG tablet Take 1 tablet (5 mg total) by mouth daily as needed for erectile dysfunction. 10 tablet 0   No current facility-administered medications on file prior to visit.   Allergies  Allergen Reactions   Zetia [Ezetimibe] Rash   Family History  Problem Relation Age of Onset   Coronary artery disease Mother 21       Died age 65, CABG   CAD Father 55       Died age 55, CABG   CAD Brother 29       Stents   Heart failure Maternal Grandmother    PE: BP 124/70   Pulse 75   Ht 5\' 9"  (1.753 m)   Wt 170 lb 3.2 oz (77.2 kg)   SpO2 99%   BMI 25.13 kg/m  Wt Readings from Last 10 Encounters:  05/30/23 170 lb 3.2  oz (77.2 kg)  02/19/23 166 lb 3.2 oz (75.4 kg)  01/26/23 163 lb 9.6 oz (74.2 kg)  01/25/23 164 lb (74.4 kg)  11/10/22 165 lb 9.6 oz (75.1 kg)  09/26/22 163 lb 3.2 oz (74 kg)  05/03/22 162 lb 6.4 oz (73.7 kg)  12/30/21 159 lb 3.2 oz (72.2 kg)  12/16/21 157 lb 6.4 oz (71.4 kg)  12/01/21 155 lb (70.3 kg)   Constitutional: normal weight, in NAD Eyes: EOMI, no exophthalmos ENT: no thyromegaly, no cervical lymphadenopathy Cardiovascular: tachycardia, RR, No MRG, + wears compression hoses for leg swelling  Respiratory: CTA B Musculoskeletal: no deformities Skin: no rashes Neurological: no tremor with outstretched hands  ASSESSMENT: 1. DM1, insulin-dependent, uncontrolled, with complications - CAD - s/p DES 2017, CABG x1 04/2017 - PN  We diagnosed type 1 diabetes based on the low C-peptide suggesting insulin deficiency: Component     Latest Ref Rng & Units 12/31/2018  ZNT8 Antibodies     U/mL <15  Glutamic Acid Decarb Ab     <5 IU/mL <5  C-Peptide     0.80 - 3.85 ng/mL <0.10 (L)  Glucose, Plasma     65 - 99 mg/dL 94  POC Glucose     70 - 99 mg/dl 119 (A)  Islet Cell Ab     Neg:<1:1 Negative   2. HL  PLAN:  1. Patient with longstanding, uncontrolled, type 1 diabetes, diagnosed in 12/2018, with fluctuating blood sugars consistent with insulin deficiency.  His diabetes improved after switching from Humalog to Lyumjev and adjusting the insulin to carb ratios but then increased again.  At last visit, HbA1c was 7.5%.  At that time, sugars appears to be decreasing overnight,  sometimes into the 50s, with an increase after waking up, without eating or drinking coffee.  To eliminate the risk of abruptly decreasing blood sugars overnight I advised him to reduce the Semglee dose and we discussed about only correcting blood sugars in the 250s or higher at night.  We also discussed that we may need higher doses of insulin before meals.  We kept the same insulin to carb ratios but advised him to  round the dose up.  I also wrote amended to count 50% of the protein amount as carbs whenever calculating mealtime boluses.  I advised him that if he separated coffee from breakfast, to bolus 3 to 4 units for coffee, otherwise, to start the breakfast bolus before coffee if coffee is close to breakfast. CGM interpretation: -At today's visit, we reviewed his CGM downloads: It appears that 63% of values are in target range (goal >70%), while 34% are higher than 180 (goal <25%), and 3% are lower than 70 (goal <4%).  The calculated average blood sugar is 165.  The projected HbA1c for the next 3 months (GMI) is 7.3%. -Reviewing the CGM trends, sugars appear to decrease overnight and then increasing gradually until approximately 7 AM after which the sugars are rising faster.  He gets out of bed around 8 AM and started having coffee soon afterwards.  He only boluses for breakfast later, not bolusing before coffee, as discussed at last visit.  At today's visit I did advise him to bolus as soon as he wakes up in preparation for the coffee.  He is not taking Lantus at night so I doubt that decreasing the Lantus dose will help with the decrease in blood sugars overnight.  He occasionally does a correction at night but only when the sugars are high and in that case inject 1 to 2 units.  We did discuss that in this particular situation an insulin pump is most helpful way to control is dawn phenomenon.  However, for now, he prefers to continue to manage his diabetes without a pump but agrees to bolus insulin as soon as he wakes up. - I suggested to:  Patient Instructions  Please continue: - Semglee 16 units in a.m. - Lyumjev ICR:  - 1:8, except 1:10 for lunch >> round the dose up - 1:6 with starches: rice, pasta, potatoes, etc. Target: 120 Sensitivity factor: 1:40, but 60 in the evening and overnight Count 50% of the proteins in a meal as carbs for low-carb meal. Bolus 3-4 units before coffee, unless you drink this  30 min before b'fast. In that case, bolus only the amount for b'fast but inject before coffee. Try not to correct sugars <250 at bedtime.  Please do the following approximately before every meal: - count carbs (C) - check sugars (S) - inject insulin (I)  Please return in 3-4 months.   - we checked his HbA1c: 7.6% (slightly higher) - advised to check sugars at different times of the day - 4x a day, rotating check times - advised for yearly eye exams >> he is not UTD - return to clinic in 4 months  2. HL -Latest lipid panel showed an LDL above target, at 101, otherwise fractions at goal: Component     Latest Ref Rng 02/15/2023  LDL Particle Number     <1,000 nmol/L 968   LDL-C (NIH Calc)     0 - 99 mg/dL 811 (H)   HDL-C     >91 mg/dL 47   Triglycerides  0 - 149 mg/dL 57   Cholesterol, Total     100 - 199 mg/dL 295   HDL Particle Number     >=30.5 umol/L 30.8   Small LDL Particle Number     <=527 nmol/L 411   LDL Size     >20.5 nm 21.0   LP-IR Score     <=45  33   -He could not tolerate statins and is currently on Repatha.  He is seen in the lipid clinic.  Carlus Pavlov, MD PhD [facility] Endocrinology

## 2023-06-19 LAB — NMR, LIPOPROFILE
Cholesterol, Total: 108 mg/dL (ref 100–199)
HDL Particle Number: 28.3 umol/L — ABNORMAL LOW (ref >=30.5)
HDL-C: 37 mg/dL — ABNORMAL LOW (ref >39)
LDL Particle Number: 562 nmol/L (ref <1000)
LDL Size: 20.6 nm (ref >20.5)
LDL-C (NIH Calc): 58 mg/dL (ref 0–99)
LP-IR Score: 46 — ABNORMAL HIGH (ref <=45)
Small LDL Particle Number: 210 nmol/L (ref <=527)
Triglycerides: 60 mg/dL (ref 0–149)

## 2023-06-20 ENCOUNTER — Encounter: Payer: Self-pay | Admitting: Internal Medicine

## 2023-06-21 ENCOUNTER — Encounter: Payer: Self-pay | Admitting: Internal Medicine

## 2023-06-21 ENCOUNTER — Ambulatory Visit: Payer: Commercial Managed Care - PPO | Attending: Internal Medicine | Admitting: Internal Medicine

## 2023-06-21 VITALS — BP 130/60 | HR 90 | Ht 69.0 in | Wt 168.0 lb

## 2023-06-21 DIAGNOSIS — Z951 Presence of aortocoronary bypass graft: Secondary | ICD-10-CM

## 2023-06-21 DIAGNOSIS — E118 Type 2 diabetes mellitus with unspecified complications: Secondary | ICD-10-CM

## 2023-06-21 DIAGNOSIS — E785 Hyperlipidemia, unspecified: Secondary | ICD-10-CM

## 2023-06-21 NOTE — Patient Instructions (Signed)
 Medication Instructions:  NO CHANGES  *If you need a refill on your cardiac medications before your next appointment, please call your pharmacy*  Follow-Up: At [facility], you and your health needs are our priority.  As part of our continuing mission to provide you with exceptional heart care, we have created designated Provider Care Teams.  These Care Teams include your primary Cardiologist (physician) and Advanced Practice Providers (APPs -  Physician Assistants and Nurse Practitioners) who all work together to provide you with the care you need, when you need it.  We recommend signing up for the patient portal called "MyChart".  Sign up information is provided on this After Visit Summary.  MyChart is used to connect with patients for Virtual Visits (Telemedicine).  Patients are able to view lab/test results, encounter notes, upcoming appointments, etc.  Non-urgent messages can be sent to your provider as well.   To learn more about what you can do with MyChart, go to [email or web address].    Your next appointment:   AS NEEDED with Dr. Rennis Golden   Routine appointments with Dr. Antoine Poche - next due October 2025  Other Instructions   1st Floor: - Lobby - Registration  - Pharmacy  - Lab - Cafe  2nd Floor: - PV Lab - Diagnostic Testing (echo, CT, nuclear med)  3rd Floor: - Vacant  4th Floor: - TCTS (cardiothoracic surgery) - AFib Clinic - Structural Heart Clinic - Vascular Surgery  - Vascular Ultrasound  5th Floor: - HeartCare Cardiology (general and EP) - Clinical Pharmacy for coumadin, hypertension, lipid, weight-loss medications, and med management appointments    Valet parking services will be available as well.

## 2023-06-21 NOTE — Progress Notes (Signed)
 LIPID CLINIC CONSULT NOTE  Chief Complaint:  Follow-up dyslipidemia  Primary Care Physician: Estanislado Pandy, MD  Primary Cardiologist:  Rollene Rotunda, MD  HPI:  Kelly Hardy is a 63 y.o. male who is being seen today for the evaluation of dyslipidemia at the request of Sasser, Clarene Critchley, MD. this is a pleasant 63 year old male with a history of coronary artery disease and type 2 diabetes status post CABG in 2019.  Other risk factors include hypertension and dyslipidemia.  His cholesterol has been reasonably well treated with a total cholesterol in May 05, 1939, HDL 43, triglycerides 59 and LDL 86.  This is on 40 mg rosuvastatin.  His target LDL should be less than 70 or if he were considered high risk less than 55.  Either way he is not at goal.  He had a recent LP(a) which was negative.  He is referred for additional therapeutic options.  He was previously trialed on ezetimibe, however he developed an extensive drug rash after his first dose.  He has been more physically active over the past 6 months, exercising more regularly and I suspect may already have an improvement in his lipids although it is unlikely that he would get to target with this alone.  02/19/2023  Mr. Bjorkman returns today for follow-up of dyslipidemia.  Unfortunately his cholesterol has gone up since we last saw him.  His LDL particle number is now 968 with an LDL of 101 (up from 86), HDL 47 and triglycerides 57.  Hemoglobin A1c was 7.5% in October.  He is on Repatha but could not tolerate high-dose rosuvastatin or ezetimibe due to rash.  06/21/2023  Mr. Pellow is seen today for follow-up.  Overall he is doing well.  With addition of low-dose rosuvastatin his cholesterol is well-controlled.  LDL particle #562, LDL 58, HDL 37 triglycerides 60.  He is tolerating this well without any side effects.  PMHx:  Past Medical History:  Diagnosis Date   Coronary artery disease    Diabetes mellitus without complication (HCC)     Type I   GERD (gastroesophageal reflux disease)    Hyperlipidemia    Hypertension    Palpitations    eval 2014   Squamous cell carcinoma of skin 05/26/1999   left sideburn tx with bx    Past Surgical History:  Procedure Laterality Date   CARDIAC CATHETERIZATION N/A 02/10/2016   Procedure: Left Heart Cath and Coronary Angiography;  Surgeon: Lennette Bihari, MD;  Location: [facility] INVASIVE CV LAB;  Service: Cardiovascular;  Laterality: N/A;   CARDIAC CATHETERIZATION N/A 02/10/2016   Procedure: Coronary Stent Intervention;  Surgeon: Lennette Bihari, MD;  Location: [facility] INVASIVE CV LAB;  Service: Cardiovascular;  Laterality: N/A;   CORONARY ARTERY BYPASS GRAFT N/A 04/04/2017   Procedure: CORONARY ARTERY BYPASS GRAFTING (CABG), times one, using left internal mammary artery. Off pump;  Surgeon: Loreli Slot, MD;  Location: [facility] OR;  Service: Open Heart Surgery;  Laterality: N/A;   CORONARY STENT PLACEMENT  02/10/2016   STENT XIENCE ALPINE RX 3.25X15 drug eluting stent was successfully placed   LEFT HEART CATH AND CORONARY ANGIOGRAPHY N/A 03/23/2017   Procedure: LEFT HEART CATH AND CORONARY ANGIOGRAPHY;  Surgeon: Swaziland, Peter M, MD;  Location: [facility] INVASIVE CV LAB;  Service: Cardiovascular;  Laterality: N/A;   LEFT HEART CATH AND CORS/GRAFTS ANGIOGRAPHY N/A 12/01/2021   Procedure: LEFT HEART CATH AND CORS/GRAFTS ANGIOGRAPHY;  Surgeon: Corky Crafts, MD;  Location: [facility] INVASIVE CV LAB;  Service: Cardiovascular;  Laterality: N/A;   SALIVARY GLAND SURGERY Left    benign   TEE WITHOUT CARDIOVERSION N/A 04/04/2017   Procedure: TRANSESOPHAGEAL ECHOCARDIOGRAM (TEE);  Surgeon: Loreli Slot, MD;  Location: [facility] OR;  Service: Open Heart Surgery;  Laterality: N/A;    FAMHx:  Family History  Problem Relation Age of Onset   Coronary artery disease Mother 2       Died age 53, CABG   CAD Father 26       Died age 109, CABG   CAD Brother 44       Stents   Heart failure Maternal Grandmother      SOCHx:   reports that he has never smoked. He has never used smokeless tobacco. He reports that he does not drink alcohol and does not use drugs.  ALLERGIES:  Allergies  Allergen Reactions   Zetia [Ezetimibe] Rash    ROS: Pertinent items noted in HPI and remainder of comprehensive ROS otherwise negative.  HOME MEDS: Current Outpatient Medications on File Prior to Visit  Medication Sig Dispense Refill   acetaminophen (TYLENOL) 500 MG tablet Take 2 tablets (1,000 mg total) by mouth every 8 (eight) hours as needed. 30 tablet 0   aspirin EC 81 MG tablet Take 81 mg by mouth daily.     Continuous Blood Gluc Receiver (FREESTYLE LIBRE 2 READER) DEVI 1 each by Does not apply route daily. 1 each 0   Continuous Blood Gluc Receiver (FREESTYLE LIBRE READER) DEVI Use Libre 3 as instructed to check blood sugar 1 each 0   Continuous Blood Gluc Sensor (FREESTYLE LIBRE 3 SENSOR) MISC Use as instructed to check blood sugar. 6 each 3   Continuous Glucose Sensor (FREESTYLE LIBRE 2 SENSOR) MISC APPLY A NEW SENSOR EVERY 14 DAYS 6 each 3   Evolocumab (REPATHA SURECLICK) 140 MG/ML SOAJ Inject 140 mg into the skin every 14 (fourteen) days. 6 mL 3   Glucagon 3 MG/DOSE POWD Place 3 mg into the nose once as needed for up to 1 dose. 1 each 11   insulin glargine-yfgn (SEMGLEE, YFGN,) 100 UNIT/ML Pen INJECT 16 TO 18 UNITS SUBCUTANEOUSLY ONCE DAILY IN THE EVENING 30 mL 3   Insulin Lispro-aabc (LYUMJEV KWIKPEN) 100 UNIT/ML KwikPen INJECT UP TO 30 UNITS SUBCUTANEOUSLY DAILY AS DIRECTED 30 mL 3   Insulin Pen Needle (EASY COMFORT PEN NEEDLES) 31G X 5 MM MISC USE 4-6 TIMES DAILY 400 each 1   Insulin Pen Needle 32G X 4 MM MISC Use 4-6x a day 400 each 3   losartan (COZAAR) 25 MG tablet TAKE ONE TABLET BY MOUTH EVERY DAY 90 tablet 3   metoprolol succinate (TOPROL-XL) 50 MG 24 hr tablet TAKE ONE TABLET BY MOUTH EVERY DAY 30 tablet 11   Multiple Vitamins-Minerals (MULTIVITAMIN WITH MINERALS) tablet Take 1 tablet by mouth  daily.     tadalafil (CIALIS) 5 MG tablet Take 1 tablet (5 mg total) by mouth daily as needed for erectile dysfunction. 10 tablet 0   rosuvastatin (CRESTOR) 5 MG tablet Take 1 tablet (5 mg total) by mouth daily. Wait to start in 2 weeks. 90 tablet 3   No current facility-administered medications on file prior to visit.    LABS/IMAGING: No results found for this or any previous visit (from the past 48 hours). No results found.  LIPID PANEL:    Component Value Date/Time   CHOL 141 05/11/2022 1146   TRIG 59 05/11/2022 1146   HDL 43 05/11/2022 1146  CHOLHDL 3.3 05/11/2022 1146   CHOLHDL 3 10/03/2019 1118   VLDL 8.4 10/03/2019 1118   LDLCALC 86 05/11/2022 1146    WEIGHTS: Wt Readings from Last 3 Encounters:  06/21/23 168 lb (76.2 kg)  05/30/23 170 lb 3.2 oz (77.2 kg)  02/19/23 166 lb 3.2 oz (75.4 kg)    VITALS: BP 130/60   Pulse 90   Ht 5\' 9"  (1.753 m)   Wt 168 lb (76.2 kg)   SpO2 98%   BMI 24.81 kg/m   EXAM: Deferred  EKG: Deferred  ASSESSMENT: Mixed dyslipidemia, goal LDL less than 55 Coronary artery disease status post CABG in 2019 Type 2 diabetes on insulin Negative LP(a)  PLAN: 1.   Mr. Mcconathy is now essentially at target with LDL close to 55.  He is tolerating the therapy well at this point with Repatha and low-dose rosuvastatin.  He is followed by Dr. Antoine Poche.  At this point I think he could follow-up with him for his primary lipid issues and me as needed.  Essentially he would only need annual renewal of his Repatha which would just be reaching out to the pharmacy techs to do the prior authorization if necessary.  I am certainly happy to see him back on an as-needed basis if his lipids are difficult to control again at some point.  Thanks for allowing me to participate in his care.  Chrystie Nose, MD, [facility], FACP  [place]  [facility]  Medical Director of the [facility] Lipid Disorders &  Cardiovascular Risk Reduction Clinic Diplomate of the  American Board of Clinical Lipidology Attending Cardiologist  Direct Dial: [phone]  Fax: [phone]  Website:  [email or web address]  Lisette Abu Kalob Bergen 06/21/2023, 10:02 AM

## 2023-10-01 ENCOUNTER — Encounter: Payer: Self-pay | Admitting: Internal Medicine

## 2023-10-01 ENCOUNTER — Ambulatory Visit (INDEPENDENT_AMBULATORY_CARE_PROVIDER_SITE_OTHER): Payer: Commercial Managed Care - PPO | Admitting: Internal Medicine

## 2023-10-01 VITALS — BP 120/70 | HR 81 | Ht 69.0 in | Wt 166.4 lb

## 2023-10-01 DIAGNOSIS — E7800 Pure hypercholesterolemia, unspecified: Secondary | ICD-10-CM

## 2023-10-01 DIAGNOSIS — E1059 Type 1 diabetes mellitus with other circulatory complications: Secondary | ICD-10-CM | POA: Diagnosis not present

## 2023-10-01 LAB — POCT GLYCOSYLATED HEMOGLOBIN (HGB A1C): Hemoglobin A1C: 7.5 % — AB (ref 4.0–5.6)

## 2023-10-01 MED ORDER — INSULIN GLARGINE-YFGN 100 UNIT/ML ~~LOC~~ SOPN: PEN_INJECTOR | SUBCUTANEOUS | 3 refills | Status: DC

## 2023-10-01 MED ORDER — FREESTYLE LIBRE 3 PLUS SENSOR MISC: 1.0000 | 3 refills | Status: AC

## 2023-10-01 MED ORDER — LYUMJEV KWIKPEN 100 UNIT/ML ~~LOC~~ SOPN
PEN_INJECTOR | SUBCUTANEOUS | 3 refills | Status: AC
Start: 2023-10-01 — End: ?

## 2023-10-01 NOTE — Addendum Note (Signed)
 Addended by: CLEOTILDE ROLIN RAMAN on: 10/01/2023 01:35 PM   Modules accepted: Orders

## 2023-10-01 NOTE — Patient Instructions (Addendum)
 Please change: - Semglee  10 units in a.m. but add 6 units at night - Lyumjev  ICR:  - 1:8, except 1:10 for lunch >> round the dose up - 1:6 with starches: rice, pasta, potatoes, etc. Target: 120 Sensitivity factor: 1:40, but 60 in the evening and overnight Count 50% of the proteins in a meal as carbs for low-carb meal. Bolus 3-4 units before coffee, unless you drink this 30 min before b'fast. In that case, bolus only the amount for b'fast but inject before coffee. Try not to correct sugars <250 at bedtime.  Please do the following approximately before every meal: - count carbs (C) - check sugars (S) - inject insulin  (I)  If you have to bolus after a meal, take ~60-70% of the dose.  Please return in 3-4 months.

## 2023-10-01 NOTE — Progress Notes (Signed)
 Patient ID: Kelly Hardy, male   DOB: May 02, 1960, 63 y.o.   MRN: [id number]   HPI: Kelly Hardy is a 63 y.o.-year-old male, initially referred by his cardiologist, Dr. Lavona, returning for follow-up for DM type I, initially dx in 1993 as DM type II, insulin -dependent since 1994, uncontrolled, with complications (CAD - s/p DES 2017, CABG x1 2019; PN). He saw Dr. Nichole until few years ago.  Last visit with me 4 months ago.  Interim history: No increased urination, blurry vision, nausea, chest pain. He has leg cramps. He is exercising more lately.  Reviewed HbA1c levels: Lab Results  Component Value Date   HGBA1C 7.6 (A) 05/30/2023   HGBA1C 7.5 (A) 01/26/2023   HGBA1C 7.4 (A) 09/26/2022   HGBA1C 7.2 (A) 05/03/2022   HGBA1C 7.2 (A) 12/30/2021   HGBA1C 6.7 (A) 08/26/2021   HGBA1C 7.4 (A) 04/28/2021   HGBA1C 7.9 (A) 12/23/2020   HGBA1C 7.4 (A) 07/09/2020   HGBA1C 7.7 (A) 03/09/2020  10/11/2018: HbA1c 8.3% 10/19/2017: HbA1c 8.5%  Pt was on a regimen of: - Humalog  75/25 15 units 2x a day before meals - Humalog  2-8 units 3x a day, before meals - per sliding scale: ISF 50, target 120, ICR ~1:20  Currently on: - Semglee  18 >> 16 >> 14 >> 16 >> 14 units in a.m. - Humalog  >> Lyumjev : ICR:  - 1:8 - 1:6  with starches: rice, pasta, potatoes, etc. Target: 120 >> actually using 90-100 >> 120 Sensitivity factor: 1:40, but 60 in the evening and overnight  Bolus 3-4 units before coffee, unless you drink this 30 min before b'fast. In that case, bolus only the amount for b'fast but inject before coffee. He was previously on Toujeo .  He checks his sugars more than 4 times a day with his freestyle libre CGM:  Prev.:  Previously:  Lowest sugar was 39 ... 50s >> 29s; he has hypoglycemia awareness in the 65s. Highest sugar was 400 >> 300s >> 300s.  Glucometer: One Touch Verio  -No CKD, last BUN/creatinine:  Lab Results  Component Value Date   BUN 20 01/26/2023   BUN 13 11/28/2021    CREATININE 0.99 01/26/2023   CREATININE 1.09 11/28/2021   No results found for: MICRALBCREAT On Cozaar  25.  -+ HL; last lipid panel:  Lab Results  Component Value Date   CHOL 141 05/11/2022   HDL 43 05/11/2022   LDLCALC 86 05/11/2022   TRIG 59 05/11/2022   CHOLHDL 3.3 05/11/2022  Prev. on Crestor  40 mg daily >> now off 2/2 joint pains.  Now seen in the lipid clinic.  Repatha  was started. Dr. Mona added Crestor  5 mg daily.  - last eye exam was 2025: No DR reportedly - [facility]. Had cataract surgery.  -He has numbness and tingling in his toes.  He also has occasional leg cramps, plantar fasciitis.  Last foot exam 09/26/2022.  He is on ASA 81.  Lab Results  Component Value Date   TSH 2.40 01/26/2023   He also has a history of HTN. He had intermittent chest pain and saw Dr. Lavona.  A cardiac cath was clear in 2023.  Pt has FH of DM in daughter - type 1 - dx. At 63 y/o. His wife had breast cancer.  ROS: + See HPI  I reviewed pt's medications, allergies, PMH, social hx, family hx, and changes were documented in the history of present illness. Otherwise, unchanged from my initial visit note.  Past Medical History:  Diagnosis Date   Coronary artery disease    Diabetes mellitus without complication (HCC)    Type I   GERD (gastroesophageal reflux disease)    Hyperlipidemia    Hypertension    Palpitations    eval 2014   Squamous cell carcinoma of skin 05/26/1999   left sideburn tx with bx   Past Surgical History:  Procedure Laterality Date   CARDIAC CATHETERIZATION N/A 02/10/2016   Procedure: Left Heart Cath and Coronary Angiography;  Surgeon: Debby DELENA Sor, MD;  Location: [facility] INVASIVE CV LAB;  Service: Cardiovascular;  Laterality: N/A;   CARDIAC CATHETERIZATION N/A 02/10/2016   Procedure: Coronary Stent Intervention;  Surgeon: Debby DELENA Sor, MD;  Location: [facility] INVASIVE CV LAB;  Service: Cardiovascular;  Laterality: N/A;   CORONARY ARTERY BYPASS GRAFT N/A  04/04/2017   Procedure: CORONARY ARTERY BYPASS GRAFTING (CABG), times one, using left internal mammary artery. Off pump;  Surgeon: Kerrin Elspeth BROCKS, MD;  Location: [facility] OR;  Service: Open Heart Surgery;  Laterality: N/A;   CORONARY STENT PLACEMENT  02/10/2016   STENT XIENCE ALPINE RX 3.25X15 drug eluting stent was successfully placed   LEFT HEART CATH AND CORONARY ANGIOGRAPHY N/A 03/23/2017   Procedure: LEFT HEART CATH AND CORONARY ANGIOGRAPHY;  Surgeon: Swaziland, Peter M, MD;  Location: [facility] INVASIVE CV LAB;  Service: Cardiovascular;  Laterality: N/A;   LEFT HEART CATH AND CORS/GRAFTS ANGIOGRAPHY N/A 12/01/2021   Procedure: LEFT HEART CATH AND CORS/GRAFTS ANGIOGRAPHY;  Surgeon: Dann Candyce RAMAN, MD;  Location: [facility] INVASIVE CV LAB;  Service: Cardiovascular;  Laterality: N/A;   SALIVARY GLAND SURGERY Left    benign   TEE WITHOUT CARDIOVERSION N/A 04/04/2017   Procedure: TRANSESOPHAGEAL ECHOCARDIOGRAM (TEE);  Surgeon: Kerrin Elspeth BROCKS, MD;  Location: [facility] OR;  Service: Open Heart Surgery;  Laterality: N/A;   Social History   Socioeconomic History   Marital status: Married    Spouse name: Not on file   Number of children: 2   Years of education: Not on file   Highest education level: Not on file  Occupational History   Occupation: Production designer, 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 30 tablet 0   aspirin  EC 81 MG tablet Take 81 mg by mouth daily.     Continuous Blood Gluc  Receiver (FREESTYLE LIBRE 2 READER) DEVI 1 each by Does not apply route daily. 1 each 0   Continuous Blood Gluc Receiver (FREESTYLE LIBRE READER) DEVI Use Libre 3 as instructed to check blood sugar 1 each 0   Continuous Blood Gluc Sensor (FREESTYLE LIBRE 3 SENSOR) MISC Use as instructed to check blood sugar. 6 each 3   Continuous Glucose Sensor (FREESTYLE LIBRE 2 SENSOR) MISC APPLY A NEW SENSOR EVERY 14 DAYS 6 each 3   Evolocumab  (REPATHA  SURECLICK) 140 MG/ML SOAJ Inject 140 mg into the skin every 14 (fourteen) days. 6  mL 3   Glucagon  3 MG/DOSE POWD Place 3 mg into the nose once as needed for up to 1 dose. 1 each 11   insulin  glargine-yfgn (SEMGLEE , YFGN,) 100 UNIT/ML Pen INJECT 16 TO 18 UNITS SUBCUTANEOUSLY ONCE DAILY IN THE EVENING 30 mL 3   Insulin  Lispro-aabc (LYUMJEV  KWIKPEN) 100 UNIT/ML KwikPen INJECT UP TO 30 UNITS SUBCUTANEOUSLY DAILY AS DIRECTED 30 mL 3   Insulin  Pen Needle (EASY COMFORT PEN NEEDLES) 31G X 5 MM MISC USE 4-6 TIMES DAILY 400 each 1   Insulin  Pen Needle 32G X 4 MM MISC Use 4-6x a day 400 each 3   losartan  (COZAAR ) 25 MG tablet TAKE ONE TABLET BY MOUTH EVERY DAY 90 tablet 3   metoprolol  succinate (TOPROL -XL) 50 MG 24 hr tablet TAKE ONE TABLET BY MOUTH EVERY DAY 30 tablet 11   Multiple Vitamins-Minerals (MULTIVITAMIN WITH MINERALS) tablet Take 1 tablet by mouth daily.     rosuvastatin  (CRESTOR ) 5 MG tablet Take 1 tablet (5 mg total) by mouth daily. Wait to start in 2 weeks. 90 tablet 3   tadalafil  (CIALIS ) 5 MG tablet Take 1 tablet (5 mg total) by mouth daily as needed for erectile dysfunction. 10 tablet 0   No current facility-administered medications on file prior to visit.   Allergies  Allergen Reactions   Zetia  [Ezetimibe ] Rash   Family History  Problem Relation Age of Onset   Coronary artery disease Mother 46       Died age 61, CABG   CAD Father 64       Died age 107, CABG   CAD Brother 56       Stents   Heart failure Maternal Grandmother    PE: BP 120/70    Pulse 81   Ht 5' 9 (1.753 m)   Wt 166 lb 6.4 oz (75.5 kg)   SpO2 97%   BMI 24.57 kg/m  Wt Readings from Last 10 Encounters:  10/01/23 166 lb 6.4 oz (75.5 kg)  06/21/23 168 lb (76.2 kg)  05/30/23 170 lb 3.2 oz (77.2 kg)  02/19/23 166 lb 3.2 oz (75.4 kg)  01/26/23 163 lb 9.6 oz (74.2 kg)  01/25/23 164 lb (74.4 kg)  11/10/22 165 lb 9.6 oz (75.1 kg)  09/26/22 163 lb 3.2 oz (74 kg)  05/03/22 162 lb 6.4 oz (73.7 kg)  12/30/21 159 lb 3.2 oz (72.2 kg)   Constitutional: normal weight, in NAD Eyes: EOMI, no exophthalmos ENT: no thyromegaly, no cervical lymphadenopathy Cardiovascular: RRR, No MRG Respiratory: CTA B Musculoskeletal: no deformities Skin: no rashes Neurological: no tremor with outstretched hands Diabetic Foot Exam - Simple   Simple Foot Form Diabetic Foot exam was performed with the following findings: Yes 10/01/2023 11:09 AM  Visual Inspection No deformities, no ulcerations, no other skin breakdown bilaterally: Yes Sensation Testing Intact to touch and monofilament testing bilaterally: Yes Pulse Check Posterior Tibialis and Dorsalis pulse intact bilaterally: Yes Comments    ASSESSMENT: 1. DM1, insulin -dependent, uncontrolled, with complications - CAD - s/p DES 2017, CABG x1 04/2017 - PN  We diagnosed type 1 diabetes based on the low C-peptide suggesting insulin  deficiency: Component     Latest Ref Rng & Units 12/31/2018  ZNT8 Antibodies     U/mL <15  Glutamic Acid Decarb Ab     <5 IU/mL <5  C-Peptide     0.80 - 3.85 ng/mL <0.10 (L)  Glucose, Plasma     65 - 99 mg/dL 94  POC Glucose  70 - 99 mg/dl 867 (A)  Islet Cell Ab     Neg:<1:1 Negative   2. HL  PLAN:  1. Patient with longstanding, uncontrolled, type 1 diabetes, diagnosed in 12/2018, with fluctuating blood sugars consistent with insulin  deficiency.  His diabetes improved after switching from Humalog  to Lyumjev  and adjusting his insulin  to carb ratios, but then worsened again.  Latest HbA1c was  7.6%, slightly higher.  Sugars appeared to decrease overnight and then increasing gradually until approximately 7 AM, after which the sugars were rising faster.  He was getting out of bed around 8 AM and starting having coffee soon afterwards.  He was not bolusing for coffee, as previously recommended so I again advised him to do so if he drinks coffee first, separated by more than 30 minutes from breakfast.  He had higher blood sugars in the morning and we discussed that this could be consistent with dawn phenomenon.  I did recommend an insulin  pump but he declined at last visit.  He agreed to try to bolus insulin  as soon as he was waking up to hopefully help with increasing blood sugars in the early morning hours. CGM interpretation: -At today's visit, we reviewed his CGM downloads: It appears that 55% of values are in target range (goal >70%), while 44% are higher than 180 (goal <25%), and 1% are lower than 70 (goal <4%).  The calculated average blood sugar is 177.  The projected HbA1c for the next 3 months (GMI) is 7.5%. -Reviewing the CGM trends, sugars appear to be fluctuating, increasing usually above target after dinner, then decreasing with a nadir around 2-3 AM, and then increasing fairly consistently, with a peak between 5 and 6 AM, before he actually eats in the morning.  Upon questioning, he may forget to take the insulin  before dinner and he may take it afterwards.  We discussed that this may be conducive to high blood sugars right after dinner and lower blood sugars in the middle of the night.  I strongly advised him to try to bolus before the meal.  I advised him that if he had to bolus after the meal, to inject only 60-70% of the dose.  He also has a snack after dinner and I advised him to try to avoid this.  He plans to work on this.  If sugars are high before going to bed, which is around midnight for him, he takes a Lyumjev  correction.  I will him to try to avoid this and, to help with his  high blood sugars in the morning, I recommended to split his Lantus  dose.  He will use a higher dose in the morning and not lower dose at night.  Otherwise, I did not change his Lyumjev  doses. - I suggested to:  Patient Instructions  Please change: - Semglee  10 units in a.m. but add 6 units at night - Lyumjev  ICR:  - 1:8, except 1:10 for lunch >> round the dose up - 1:6 with starches: rice, pasta, potatoes, etc. Target: 120 Sensitivity factor: 1:40, but 60 in the evening and overnight Count 50% of the proteins in a meal as carbs for low-carb meal. Bolus 3-4 units before coffee, unless you drink this 30 min before b'fast. In that case, bolus only the amount for b'fast but inject before coffee. Try not to correct sugars <250 at bedtime.  Please do the following approximately before every meal: - count carbs (C) - check sugars (S) - inject insulin  (I)  If you  have to bolus after a meal, take ~60-70% of the dose.  Please return in 3-4 months.   - we checked his HbA1c: 7.5% (slightly lower) - advised to check sugars at different times of the day - 4x a day, rotating check times - advised for yearly eye exams >> he is UTD - return to clinic in 3-4 months  2. HL - Latest lipid panel from 06/2023 showed an LDL much improved, at 58, HDL slightly low, triglycerides at goal. - He is seen in the lipid clinic and is on Repatha . Also, on Crestor  5 mg daily.  He was not able to tolerate a higher dose of statin.  Lela Fendt, MD PhD [facility]

## 2023-10-05 ENCOUNTER — Encounter: Payer: Self-pay | Admitting: Internal Medicine

## 2023-10-05 DIAGNOSIS — E1059 Type 1 diabetes mellitus with other circulatory complications: Secondary | ICD-10-CM

## 2023-10-09 ENCOUNTER — Other Ambulatory Visit: Payer: Self-pay

## 2023-10-09 MED ORDER — BASAGLAR KWIKPEN 100 UNIT/ML ~~LOC~~ SOPN: PEN_INJECTOR | SUBCUTANEOUS | 0 refills | Status: DC

## 2023-10-30 ENCOUNTER — Telehealth: Payer: Self-pay | Admitting: Pharmacy Technician

## 2023-10-30 ENCOUNTER — Other Ambulatory Visit (HOSPITAL_COMMUNITY): Payer: Self-pay

## 2023-10-30 NOTE — Telephone Encounter (Signed)
 Pharmacy Patient Advocate Encounter   Received notification from CoverMyMeds that prior authorization for Repatha  is required/requested.   Insurance verification completed.   The patient is insured through [employer] .   Per test claim: PA required; PA submitted to above mentioned insurance via CoverMyMeds Key/confirmation #/EOC [employer] Status is pending

## 2023-11-05 ENCOUNTER — Other Ambulatory Visit (HOSPITAL_COMMUNITY): Payer: Self-pay

## 2023-11-05 NOTE — Telephone Encounter (Signed)
 Pharmacy Patient Advocate Encounter  Received notification from [facility] that Prior Authorization for repatha  has been APPROVED from 09/30/23 to 11/02/24. Ran test claim, Copay is $211.94- 3 months . This test claim was processed through [facility]- copay amounts may vary at other pharmacies due to pharmacy/plan contracts, or as the patient moves through the different stages of their insurance plan.   PA #/Case ID/Reference #: [id number]

## 2023-12-04 ENCOUNTER — Other Ambulatory Visit: Payer: Self-pay | Admitting: Internal Medicine

## 2023-12-11 ENCOUNTER — Other Ambulatory Visit: Payer: Self-pay | Admitting: Internal Medicine

## 2024-01-05 ENCOUNTER — Other Ambulatory Visit: Payer: Self-pay | Admitting: Cardiology

## 2024-02-05 ENCOUNTER — Ambulatory Visit: Admitting: Internal Medicine

## 2024-02-05 ENCOUNTER — Ambulatory Visit (INDEPENDENT_AMBULATORY_CARE_PROVIDER_SITE_OTHER): Admitting: Internal Medicine

## 2024-02-05 ENCOUNTER — Encounter: Payer: Self-pay | Admitting: Internal Medicine

## 2024-02-05 ENCOUNTER — Other Ambulatory Visit

## 2024-02-05 VITALS — BP 120/60 | HR 62 | Ht 69.0 in | Wt 169.6 lb

## 2024-02-05 DIAGNOSIS — E7800 Pure hypercholesterolemia, unspecified: Secondary | ICD-10-CM

## 2024-02-05 DIAGNOSIS — E1059 Type 1 diabetes mellitus with other circulatory complications: Secondary | ICD-10-CM

## 2024-02-05 LAB — POCT GLYCOSYLATED HEMOGLOBIN (HGB A1C): Hemoglobin A1C: 7.7 % — AB (ref 4.0–5.6)

## 2024-02-05 NOTE — Progress Notes (Signed)
 Patient ID: Kelly Hardy, male   DOB: 1960-05-22, 63 y.o.   MRN: [id number]   HPI: Kelly Hardy is a 63 y.o.-year-old male, initially referred by his cardiologist, Dr. Lavona, returning for follow-up for DM type I, initially dx in 1993 as DM type II, insulin -dependent since 1994, uncontrolled, with complications (CAD - s/p DES 2017, CABG x1 2019; PN). He saw Dr. Nichole until few years ago.  Last visit with me 4 months ago.  Interim history: No increased urination, blurry vision, nausea, chest pain.  He has otitis for the last 2 months >> was on Prednisone. He is seeing ENT - will have a ear drum test.   Reviewed HbA1c levels: Lab Results  Component Value Date   HGBA1C 7.5 (A) 10/01/2023   HGBA1C 7.6 (A) 05/30/2023   HGBA1C 7.5 (A) 01/26/2023   HGBA1C 7.4 (A) 09/26/2022   HGBA1C 7.2 (A) 05/03/2022   HGBA1C 7.2 (A) 12/30/2021   HGBA1C 6.7 (A) 08/26/2021   HGBA1C 7.4 (A) 04/28/2021   HGBA1C 7.9 (A) 12/23/2020   HGBA1C 7.4 (A) 07/09/2020  10/11/2018: HbA1c 8.3% 10/19/2017: HbA1c 8.5%  Pt was on a regimen of: - Humalog  75/25 15 units 2x a day before meals - Humalog  2-8 units 3x a day, before meals - per sliding scale: ISF 50, target 120, ICR ~1:20  Currently on: - Semglee  18 >> 16 >> 14 >> 16 >> 14 units in a.m.>> 10 units in a.m. and 6 units at night - Humalog  >> Lyumjev : ICR:  - 1:8 - 1:6  with starches: rice, pasta, potatoes, etc. Target: 120 >> actually using 90-100 >> 120 Sensitivity factor: 1:40, but 60 in the evening and overnight  Bolus 3-4 units before coffee, unless you drink this 30 min before b'fast. In that case, bolus only the amount for b'fast but inject before coffee.  He was previously on Toujeo .  He checks his sugars more than 4 times a day with his freestyle libre CGM:  Prev.:  Prev.:   Lowest sugar was 39 ... 50s >> 29s; he has hypoglycemia awareness in the 9s. Highest sugar was 400 >> 300s >> 300s.  Glucometer: One Touch Verio  -No CKD, last  BUN/creatinine:  Lab Results  Component Value Date   BUN 20 01/26/2023   BUN 13 11/28/2021   CREATININE 0.99 01/26/2023   CREATININE 1.09 11/28/2021   No results found for: MICRALBCREAT On Cozaar  25.  -+ HL; last lipid panel:  Lab Results  Component Value Date   CHOL 141 05/11/2022   HDL 43 05/11/2022   LDLCALC 86 05/11/2022   TRIG 59 05/11/2022   CHOLHDL 3.3 05/11/2022  Prev. on Crestor  40 mg daily >> now off 2/2 joint pains.  Currently on Repatha  and Crestor  5 mg daily-per the lipid clinic.  - last eye exam was 2025: No DR reportedly - [facility]. Had cataract surgery.  -He has numbness and tingling in his toes.  He also has occasional leg cramps, plantar fasciitis.  Last foot exam 10/01/2023.  He is on ASA 81.  Lab Results  Component Value Date   TSH 2.40 01/26/2023   He also has a history of HTN. He had intermittent chest pain and saw Dr. Lavona.  A cardiac cath was clear in 2023.  Pt has FH of DM in daughter - type 1 - dx. At 13 y/o. His wife had breast cancer.  ROS: + See HPI  I reviewed pt's medications, allergies, PMH, social hx, family hx, and  changes were documented in the history of present illness. Otherwise, unchanged from my initial visit note.  Past Medical History:  Diagnosis Date   Coronary artery disease    Diabetes mellitus without complication (HCC)    Type I   GERD (gastroesophageal reflux disease)    Hyperlipidemia    Hypertension    Palpitations    eval 2014   Squamous cell carcinoma of skin 05/26/1999   left sideburn tx with bx   Past Surgical History:  Procedure Laterality Date   CARDIAC CATHETERIZATION N/A 02/10/2016   Procedure: Left Heart Cath and Coronary Angiography;  Surgeon: Debby DELENA Sor, MD;  Location: [facility] INVASIVE CV LAB;  Service: Cardiovascular;  Laterality: N/A;   CARDIAC CATHETERIZATION N/A 02/10/2016   Procedure: Coronary Stent Intervention;  Surgeon: Debby DELENA Sor, MD;  Location: [facility] INVASIVE CV LAB;  Service:  Cardiovascular;  Laterality: N/A;   CORONARY ARTERY BYPASS GRAFT N/A 04/04/2017   Procedure: CORONARY ARTERY BYPASS GRAFTING (CABG), times one, using left internal mammary artery. Off pump;  Surgeon: Kerrin Elspeth BROCKS, MD;  Location: [facility] OR;  Service: Open Heart Surgery;  Laterality: N/A;   CORONARY STENT PLACEMENT  02/10/2016   STENT XIENCE ALPINE RX 3.25X15 drug eluting stent was successfully placed   LEFT HEART CATH AND CORONARY ANGIOGRAPHY N/A 03/23/2017   Procedure: LEFT HEART CATH AND CORONARY ANGIOGRAPHY;  Surgeon: Jordan, Peter M, MD;  Location: [facility] INVASIVE CV LAB;  Service: Cardiovascular;  Laterality: N/A;   LEFT HEART CATH AND CORS/GRAFTS ANGIOGRAPHY N/A 12/01/2021   Procedure: LEFT HEART CATH AND CORS/GRAFTS ANGIOGRAPHY;  Surgeon: Dann Candyce RAMAN, MD;  Location: [facility] INVASIVE CV LAB;  Service: Cardiovascular;  Laterality: N/A;   SALIVARY GLAND SURGERY Left    benign   TEE WITHOUT CARDIOVERSION N/A 04/04/2017   Procedure: TRANSESOPHAGEAL ECHOCARDIOGRAM (TEE);  Surgeon: Kerrin Elspeth BROCKS, MD;  Location: [facility] OR;  Service: Open Heart Surgery;  Laterality: N/A;   Social History   Socioeconomic History   Marital status: Married    Spouse name: Not on file   Number of children: 2   Years of education: Not on file   Highest education level: Not on file  Occupational History   Occupation: Production Designer, 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 30 tablet 0   aspirin  EC  81 MG tablet Take 81 mg by mouth daily.     Continuous Blood Gluc Receiver (FREESTYLE LIBRE 2 READER) DEVI 1 each by Does not apply route daily. 1 each 0   Continuous Blood Gluc Receiver (FREESTYLE LIBRE READER) DEVI Use Libre 3 as instructed to check blood sugar 1 each 0   Continuous Glucose Sensor (FREESTYLE LIBRE 3 PLUS SENSOR) MISC 1 each by Does not apply route every 14 (fourteen) days. 6 each 3   EASY COMFORT PEN NEEDLES 31G X 5 MM MISC USE 4-6 TIMES  DAILY 400 each 1   Evolocumab  (REPATHA  SURECLICK) 140 MG/ML SOAJ INJECT 140 MG INTO THE SKIN EVERY 14 DAYS 6 mL 3   Glucagon  3 MG/DOSE POWD Place 3 mg into the nose once as needed for up to 1 dose. 1 each 11   Insulin  Glargine (BASAGLAR  KWIKPEN) 100 UNIT/ML INJECT 16 TO 18 UNITS SUBCUTANEOUSLY DAILY 30 mL 0   insulin  glargine-yfgn (SEMGLEE , YFGN,) 100 UNIT/ML Pen INJECT 16 TO 18 UNITS SUBCUTANEOUSLY  DAILY 30 mL 3   Insulin  Lispro-aabc (LYUMJEV  KWIKPEN) 100 UNIT/ML KwikPen INJECT UP TO 30 UNITS SUBCUTANEOUSLY DAILY AS DIRECTED 30 mL 3   Insulin  Pen Needle 32G X 4 MM MISC Use 4-6x a day 400 each 3   losartan  (COZAAR ) 25 MG tablet TAKE ONE TABLET BY MOUTH EVERY DAY 90 tablet 0   metoprolol  succinate (TOPROL -XL) 50 MG 24 hr tablet TAKE ONE TABLET BY MOUTH EVERY DAY 30 tablet 11   Multiple Vitamins-Minerals (MULTIVITAMIN WITH MINERALS) tablet Take 1 tablet by mouth daily.     rosuvastatin  (CRESTOR ) 5 MG tablet Take 1 tablet (5 mg total) by mouth daily. Wait to start in 2 weeks. 90 tablet 3   tadalafil  (CIALIS ) 5 MG tablet Take 1 tablet (5 mg total) by mouth daily as needed for erectile dysfunction. 10 tablet 0   No current facility-administered medications on file prior to visit.   Allergies  Allergen Reactions   Zetia  [Ezetimibe ] Rash   Family History  Problem Relation Age of Onset   Coronary artery disease Mother 66       Died age 7, CABG   CAD Father 58       Died age 44, CABG   CAD Brother 73       Stents   Heart failure Maternal  Grandmother    PE: BP 120/60   Pulse 62   Ht 5' 9 (1.753 m)   Wt 169 lb 9.6 oz (76.9 kg)   SpO2 99%   BMI 25.05 kg/m  Wt Readings from Last 10 Encounters:  02/05/24 169 lb 9.6 oz (76.9 kg)  10/01/23 166 lb 6.4 oz (75.5 kg)  06/21/23 168 lb (76.2 kg)  05/30/23 170 lb 3.2 oz (77.2 kg)  02/19/23 166 lb 3.2 oz (75.4 kg)  01/26/23 163 lb 9.6 oz (74.2 kg)  01/25/23 164 lb (74.4 kg)  11/10/22 165 lb 9.6 oz (75.1 kg)  09/26/22 163 lb 3.2 oz (74 kg)  05/03/22 162 lb 6.4 oz (73.7 kg)   Constitutional: normal weight, in NAD Eyes: EOMI, no exophthalmos ENT: no thyromegaly, no cervical lymphadenopathy Cardiovascular: RRR, No MRG Respiratory: CTA B Musculoskeletal: no deformities Skin: no rashes Neurological: no tremor with outstretched hands  ASSESSMENT: 1. DM1, insulin -dependent, uncontrolled, with complications - CAD - s/p DES 2017, CABG x1 04/2017 - PN  We diagnosed type 1 diabetes based on the low C-peptide suggesting insulin  deficiency: Component     Latest Ref Rng & Units 12/31/2018  ZNT8 Antibodies     U/mL <15  Glutamic Acid Decarb Ab     <5 IU/mL <5  C-Peptide     0.80 - 3.85 ng/mL <0.10 (L)  Glucose, Plasma     65 - 99 mg/dL 94  POC Glucose     70 - 99 mg/dl 867 (A)  Islet Cell Ab     Neg:<1:1 Negative   2. HL  PLAN:  1. Patient with longstanding, uncontrolled, type 1 diabetes, diagnosed 5 years ago, with fluctuating blood sugars consistent with insulin   deficiency.  His diabetes control improved after switching from Humalog  to Lyumjev  and adjusting his insulin  to carb ratios but worsened again.  At last visit, HbA1c was slightly lower, at 7.5%.  I previously recommended an insulin  pump but he declined. - At last visit, reviewing CGM trends, sugars appears to be fluctuating, increasing usually above target after dinner and then increasing with a nadir around 2-3 AM and then increasing fairly consistently, with a peak between 5-6 AM.  This was before he was actually  eating in the morning.  He was occasionally taking his dinnertime insulin  after he eats dinner, and we discussed about trying to avoid this practice, since this could cause low blood sugars during the night.  I did advise him that if he needed to inject after the meal, to reduce the dose of insulin .  He was having a snack after dinner and I advised him to try to avoid this.  He was taking a Lyumjev  correction around midnight if the sugars were high before going to bed and we discussed about trying to avoid correction to see if the sugars improved by themselves.  However, I also recommended to split his Lantus  dose and add a lower dose at night. CGM interpretation: -At today's visit, we reviewed his CGM downloads: It appears that 61% of values are in target range (goal >70%), while 38% are higher than 180 (goal <25%), and 1% are lower than 70 (goal <4%).  The calculated average blood sugar is 169.  The projected HbA1c for the next 3 months (GMI) is 7.4%. -Reviewing the CGM trends, sugars appear to have improved overnight after adding a second dose of Lantus  at night, but they do increase after breakfast and more so after dinner.  She does mention that he is not taking insulin  before coffee as advised at the previous visit and I again recommended to take 3 to 4 units before this, especially since he also puts creamer in his coffee.  He tells me that his afraid that he may drop his sugars during exercise in the morning, but I do not see evidence of this so I advised him to start by taking the breakfast insulin  dose if eating before exercise but may need to relax his insulin  to carb ratios if this drops his blood sugars too low afterwards.  Regarding dinnertime, he mentions that he is forgetting the dose of insulin  before the meal and we discussed again about the importance of doing so.  His wife just retired and she is planning to start cooking more and improving the quality of their meals and I think that this  would also help.  I did not recommend a change in the rest of the regimen. - I suggested to:  Patient Instructions  Please continue: - Semglee  10 units in a.m. but add 6 units at night - Lyumjev  ICR:  - 1:8 for b'fast - 1:10 for lunch  - 1:8 for dinner (do not miss this) BUT 1:6 with starches: rice, pasta, potatoes, etc. Target: 120 Sensitivity factor: 1:40, but 60 in the evening and overnight Count 50% of the proteins in a meal as carbs for low-carb meal. Bolus 3-4 units before coffee, unless you drink this 30 min before b'fast. In that case, bolus only the amount for b'fast but inject before coffee. Try not to correct sugars <250 at bedtime. If you have to bolus after a meal, take ~60-70% of the dose.  Please do the following approximately before every meal: -  count carbs (C) - check sugars (S) - inject insulin  (I)  Please return in 3-4 months.   - we checked his HbA1c: 7.7% (higher) - advised to check sugars at different times of the day - 4x a day, rotating check times - advised for yearly eye exams >> he is UTD - will check an ACR, CMP, and TSH at today's visit - return to clinic in 3-4 months  2. HL -Reviewed latest lipid panel from 06/2023 showing much improved LDL, at 58, slightly low HDL and triglycerides at goal - He is seen in the lipid clinic-on Repatha , Crestor  5 mg daily.  Seeing Dr. Lavona for this.  He was not able to tolerate a higher dose of statin.  Lela Fendt, MD PhD [facility] Endocrinology

## 2024-02-05 NOTE — Patient Instructions (Addendum)
 Please continue: - Semglee  10 units in a.m. but add 6 units at night - Lyumjev  ICR:  - 1:8 for b'fast - 1:10 for lunch  - 1:8 for dinner (do not miss this) BUT 1:6 with starches: rice, pasta, potatoes, etc. Target: 120 Sensitivity factor: 1:40, but 60 in the evening and overnight Count 50% of the proteins in a meal as carbs for low-carb meal. Bolus 3-4 units before coffee, unless you drink this 30 min before b'fast. In that case, bolus only the amount for b'fast but inject before coffee. Try not to correct sugars <250 at bedtime. If you have to bolus after a meal, take ~60-70% of the dose.  Please do the following approximately before every meal: - count carbs (C) - check sugars (S) - inject insulin  (I)  Please return in 3-4 months.

## 2024-02-06 ENCOUNTER — Ambulatory Visit: Payer: Self-pay | Admitting: Internal Medicine

## 2024-02-06 LAB — COMPREHENSIVE METABOLIC PANEL WITH GFR
AG Ratio: 1.6 (calc) (ref 1.0–2.5)
ALT: 21 U/L (ref 9–46)
AST: 25 U/L (ref 10–35)
Albumin: 4.2 g/dL (ref 3.6–5.1)
Alkaline phosphatase (APISO): 48 U/L (ref 35–144)
BUN: 16 mg/dL (ref 7–25)
CO2: 30 mmol/L (ref 20–32)
Calcium: 9.5 mg/dL (ref 8.6–10.3)
Chloride: 101 mmol/L (ref 98–110)
Creat: 1.02 mg/dL (ref 0.70–1.35)
Globulin: 2.6 g/dL (ref 1.9–3.7)
Glucose, Bld: 142 mg/dL — ABNORMAL HIGH (ref 65–99)
Potassium: 4.7 mmol/L (ref 3.5–5.3)
Sodium: 137 mmol/L (ref 135–146)
Total Bilirubin: 0.4 mg/dL (ref 0.2–1.2)
Total Protein: 6.8 g/dL (ref 6.1–8.1)
eGFR: 82 mL/min/1.73m2 (ref >=60)

## 2024-02-06 LAB — MICROALBUMIN / CREATININE URINE RATIO
Creatinine, Urine: 44 mg/dL (ref 20–320)
Microalb Creat Ratio: 5 mg/g{creat} (ref <30)
Microalb, Ur: 0.2 mg/dL

## 2024-02-06 LAB — TSH: TSH: 1.87 m[IU]/L (ref 0.40–4.50)

## 2024-02-13 ENCOUNTER — Other Ambulatory Visit: Payer: Self-pay | Admitting: Cardiology

## 2024-02-14 NOTE — Progress Notes (Unsigned)
 Cardiology Office Note:   Date:  02/15/2024  ID:  Kelly Hardy, DOB 04-12-1960, MRN [id number] PCP: Atilano Deward ORN, MD  [facility] Providers Cardiologist:  Lynwood Schilling, MD {  History of Present Illness:   Kelly Hardy is a 63 y.o. male  with complaints of exertional chest pain 02/01/16. OP stress Myoview  done 02/09/16 showed diaphragmatic attenuation with 2mm inferior ST depression and chest pain. He was admitted the next day to [facility] for OP cath which revealed a 95% mRCA. He recieved a DES with good results.  I saw him in follow up and he had increased angina and was admitted for cath.  This demonstrated dLM-ostial LAD 90% stenosis and 45% mLAD stenosis. The RCA stent was patent. He was discharged ad re admitted Jan 2nd 2019 for elective CABG x 1 with an LIMA-LAD. He tolerated this well. EF was 55-60%.  He returns for follow up.     He did have a cath and had patent bypass graft and stenting in 2023.  Since I last saw him he has developed some hearing loss.  I reviewed otolaryngology records from [facility] and they had wanted to consider starting him on HCTZ but wanted to defer to me.  The hearing loss is possibly related to fluid in the middle ear.  Patient denies any cardiovascular symptoms.  He was to the gym 2-3 times a week.  He gets on a treadmill.  He walks for exercise.  He gets his heart rate up. The patient denies any new symptoms such as chest discomfort, neck or arm discomfort. There has been no new shortness of breath, PND or orthopnea. There have been no reported palpitations, presyncope or syncope.  ROS: As stated in the HPI and negative for all other systems.  Studies Reviewed:    EKG:   EKG Interpretation Date/Time:  Friday February 15 2024 09:12:46 EST Ventricular Rate:  73 PR Interval:  156 QRS Duration:  102 QT Interval:  386 QTC Calculation: 425 R Axis:   75  Text Interpretation: Normal sinus rhythm When compared with ECG of 25-Jan-2023 16:36, No  significant change was found Confirmed by Schilling Lynwood ([id number]) on 02/15/2024 9:13:28 AM    Risk Assessment/Calculations:              Physical Exam:   VS:  BP 138/70   Pulse 73   Ht 5' 9 (1.753 m)   Wt 170 lb (77.1 kg)   SpO2 97%   BMI 25.10 kg/m    Wt Readings from Last 3 Encounters:  02/15/24 170 lb (77.1 kg)  02/05/24 169 lb 9.6 oz (76.9 kg)  10/01/23 166 lb 6.4 oz (75.5 kg)     GEN: Well nourished, well developed in no acute distress NECK: No JVD; soft left carotid bruits CARDIAC: RRR, no murmurs, rubs, gallops RESPIRATORY:  Clear to auscultation without rales, wheezing or rhonchi  ABDOMEN: Soft, non-tender, non-distended EXTREMITIES:  No edema; No deformity   ASSESSMENT AND PLAN:   CABG: Patient is had no new symptoms.  No change in therapy.  He will continue with risk reduction.  DYSLIPIDEMIA: LDL is 58 with an HDL 37.  Continue meds as listed. e at target with Repatha  alone.    HTN:    The blood pressure is at target.  No change in therapy.  DM TYPE I:  A1c was 7.7 and being followed by Dr.Gherghe.    HEARTING LOSS: I am going to start HCTZ 25 mg daily per  ENT suggestion.  I will check a basic metabolic profile in 2 weeks.     Follow up with me in one year.   Signed, Lynwood Schilling, MD

## 2024-02-15 ENCOUNTER — Encounter: Payer: Self-pay | Admitting: Cardiology

## 2024-02-15 ENCOUNTER — Ambulatory Visit: Attending: Cardiology | Admitting: Cardiology

## 2024-02-15 ENCOUNTER — Other Ambulatory Visit (HOSPITAL_COMMUNITY): Payer: Self-pay

## 2024-02-15 VITALS — BP 138/70 | HR 73 | Ht 69.0 in | Wt 170.0 lb

## 2024-02-15 DIAGNOSIS — E1059 Type 1 diabetes mellitus with other circulatory complications: Secondary | ICD-10-CM

## 2024-02-15 DIAGNOSIS — I2510 Atherosclerotic heart disease of native coronary artery without angina pectoris: Secondary | ICD-10-CM | POA: Diagnosis not present

## 2024-02-15 DIAGNOSIS — I10 Essential (primary) hypertension: Secondary | ICD-10-CM | POA: Diagnosis not present

## 2024-02-15 DIAGNOSIS — R0989 Other specified symptoms and signs involving the circulatory and respiratory systems: Secondary | ICD-10-CM

## 2024-02-15 DIAGNOSIS — E785 Hyperlipidemia, unspecified: Secondary | ICD-10-CM

## 2024-02-15 MED ORDER — HYDROCHLOROTHIAZIDE 25 MG PO TABS
25.0000 mg | ORAL_TABLET | Freq: Every day | ORAL | 3 refills | Status: AC
Start: 2024-02-15 — End: ?
  Filled 2024-02-15: qty 90, 90d supply, fill #0

## 2024-02-15 NOTE — Patient Instructions (Signed)
 Medication Instructions:  Start hydrochlorothiazide 25 mg once daily *If you need a refill on your cardiac medications before your next appointment, please call your pharmacy*  Lab Work: BMET in 2 weeks at [facility] If you have labs (blood work) drawn today and your tests are completely normal, you will receive your results only by: MyChart Message (if you have MyChart) OR A paper copy in the mail If you have any lab test that is abnormal or we need to change your treatment, we will call you to review the results.  Testing/Procedures: Carotid Doppler  Follow-Up: At [facility] you and your health needs are our priority.  As part of our continuing mission to provide you with exceptional heart care, our providers are all part of one team.  This team includes your primary Cardiologist (physician) and Advanced Practice Providers or APPs (Physician Assistants and Nurse Practitioners) who all work together to provide you with the care you need, when you need it.  Your next appointment:   1 year(s)  Provider:   Lynwood Schilling, MD    We recommend signing up for the patient portal called MyChart.  Sign up information is provided on this After Visit Summary.  MyChart is used to connect with patients for Virtual Visits (Telemedicine).  Patients are able to view lab/test results, encounter notes, upcoming appointments, etc.  Non-urgent messages can be sent to your provider as well.   To learn more about what you can do with MyChart, go to [email or web address].

## 2024-02-20 ENCOUNTER — Other Ambulatory Visit (HOSPITAL_COMMUNITY): Payer: Self-pay

## 2024-02-20 ENCOUNTER — Ambulatory Visit (HOSPITAL_COMMUNITY)
Admission: RE | Admit: 2024-02-20 | Discharge: 2024-02-20 | Disposition: A | Discharge status: Home / Self Care | Source: Ambulatory Visit | Attending: Cardiology | Admitting: Cardiology

## 2024-02-20 DIAGNOSIS — R0989 Other specified symptoms and signs involving the circulatory and respiratory systems: Secondary | ICD-10-CM | POA: Diagnosis present

## 2024-02-24 ENCOUNTER — Ambulatory Visit: Payer: Self-pay | Admitting: Cardiology

## 2024-02-27 LAB — BASIC METABOLIC PANEL WITH GFR
BUN/Creatinine Ratio: 17 (ref 10–24)
BUN: 20 mg/dL (ref 8–27)
CO2: 28 mmol/L (ref 20–29)
Calcium: 10.1 mg/dL (ref 8.6–10.2)
Chloride: 96 mmol/L (ref 96–106)
Creatinine, Ser: 1.15 mg/dL (ref 0.76–1.27)
Glucose: 240 mg/dL — ABNORMAL HIGH (ref 70–99)
Potassium: 4.5 mmol/L (ref 3.5–5.2)
Sodium: 137 mmol/L (ref 134–144)
eGFR: 71 mL/min/1.73 (ref >59)

## 2024-03-13 ENCOUNTER — Other Ambulatory Visit: Payer: Self-pay | Admitting: Internal Medicine

## 2024-03-13 DIAGNOSIS — E785 Hyperlipidemia, unspecified: Secondary | ICD-10-CM

## 2024-03-14 ENCOUNTER — Other Ambulatory Visit: Payer: Self-pay | Admitting: Internal Medicine

## 2024-03-14 DIAGNOSIS — E785 Hyperlipidemia, unspecified: Secondary | ICD-10-CM

## 2024-04-05 ENCOUNTER — Other Ambulatory Visit: Payer: Self-pay | Admitting: Internal Medicine

## 2024-04-05 DIAGNOSIS — E1059 Type 1 diabetes mellitus with other circulatory complications: Secondary | ICD-10-CM

## 2024-04-08 MED ORDER — LANTUS SOLOSTAR 100 UNIT/ML ~~LOC~~ SOPN: PEN_INJECTOR | SUBCUTANEOUS | 3 refills | Status: AC

## 2024-04-14 ENCOUNTER — Other Ambulatory Visit (HOSPITAL_COMMUNITY): Payer: Self-pay

## 2024-05-21 ENCOUNTER — Ambulatory Visit: Admitting: Internal Medicine
